# Patient Record
Sex: Male | Born: 1950 | Race: White | Hispanic: No | Marital: Single | Smoking: Never smoker
Health system: Southern US, Community
[De-identification: ages and names within clinical notes are randomized; demographics above are authoritative.]

## PROBLEM LIST (undated history)

## (undated) DIAGNOSIS — E785 Hyperlipidemia, unspecified: Secondary | ICD-10-CM

## (undated) DIAGNOSIS — S31109A Unspecified open wound of abdominal wall, unspecified quadrant without penetration into peritoneal cavity, initial encounter: Secondary | ICD-10-CM

## (undated) DIAGNOSIS — L089 Local infection of the skin and subcutaneous tissue, unspecified: Secondary | ICD-10-CM

## (undated) DIAGNOSIS — I4891 Unspecified atrial fibrillation: Principal | ICD-10-CM

## (undated) DIAGNOSIS — M1990 Unspecified osteoarthritis, unspecified site: Secondary | ICD-10-CM

## (undated) DIAGNOSIS — I10 Essential (primary) hypertension: Secondary | ICD-10-CM

## (undated) HISTORY — PX: WRIST SURGERY: SHX841

## (undated) HISTORY — DX: Morbid (severe) obesity due to excess calories: E66.01

## (undated) HISTORY — DX: Essential (primary) hypertension: I10

## (undated) HISTORY — PX: VENTRAL HERNIA REPAIR: SHX424

## (undated) HISTORY — DX: Hyperlipidemia, unspecified: E78.5

---

## 2007-11-22 HISTORY — PX: ABDOMINAL DEBRIDEMENT: SHX1109

## 2008-01-14 ENCOUNTER — Inpatient Hospital Stay (HOSPITAL_COMMUNITY): Admission: EM | Admit: 2008-01-14 | Discharge: 2008-01-17 | Payer: Self-pay | Admitting: Emergency Medicine

## 2008-08-08 ENCOUNTER — Ambulatory Visit (HOSPITAL_COMMUNITY): Admission: RE | Admit: 2008-08-08 | Discharge: 2008-08-08 | Payer: Self-pay | Admitting: Surgery

## 2011-04-05 NOTE — H&P (Signed)
NAME:  Jeremy Rush, Jeremy Rush               ACCOUNT NO.:  [id number]   MEDICAL RECORD NO.:  [id number]          PATIENT TYPE:  INP   LOCATION:  5506                         FACILITY:  [facility]   PHYSICIAN:  Madaline Savage, MD        DATE OF BIRTH:  07/28/1955   DATE OF ADMISSION:  01/13/2008  DATE OF DISCHARGE:                              HISTORY & PHYSICAL   PRIMARY CARE PHYSICIAN:  [facility].   CHIEF COMPLAINT:  Redness over his abdomen.   HISTORY OF PRESENT ILLNESS:  Jeremy Rush is a 60 year old, obese,  gentleman with a history of hypertension and multiple surgeries for  ventral hernia, concerned with the redness over his surgical scar for  approximately one week.  He first noticed redness around his abdomen  over his umbilicus.  This was slowly getting darker.  Yesterday he  noticed that there was a knot right in the middle of the redness and  today the knot burst and reddish fluid came out of it.  He denied any  fever or chills.  He denied having any other complaints. He denied  having abdominal pain, nausea, vomiting, diarrhea, constipation.   PAST MEDICAL HISTORY:  1. Hypertension  2. Arthritis of knees.   PAST SURGICAL HISTORY:  1. He had four surgeries for a ventral hernia in the past. His last      surgery was approximately 10 years ago.  2. Right wrist surgery.   ALLERGIES:  He is allergic to CODEINE and MORPHINE.   CURRENT MEDICATIONS:  He states he is on a fluid pill.  He does not  remember the name of the medication or the dose.   SOCIAL HISTORY:  He is on disability.  He denies any history of smoking,  alcohol, or drug abuse.   FAMILY HISTORY:  His mother is 26. She is healthy.  His father died in  his 83's of some kind of infection.  He is not sure about it.   REVIEW OF SYSTEMS:  GENERAL:  He denies any recent weight loss, weight  gain. No fever or chills.  HEENT: No headaches, no blurred vision, no  sore throat. CARDIOVASCULAR SYSTEM:  Palpitations.  RESPIRATORY  SYSTEM:  No shortness of breath, cough.  ABDOMEN:  No abdominal pain, nausea,  vomiting, diarrhea, or constipation.  GU SYSTEM:  Denies any dysuria or  polyuria.  EXTREMITIES:  He denies any tingling or numbness in toes or  fingers.   PHYSICAL EXAMINATION:  GENERAL:  Alert and oriented x3.  VITAL SIGNS:  Temperature 97, pulse rate 77, blood pressure 151/89,  respiratory rate 20, oxygen saturation 94% on room air.  HEENT:  Head atraumatic, normocephalic.  Pupils bilaterally equal and  reactive to light.  Mucous membranes are moist.  NECK:  Supple.  No JVD.  No carotid bruits.  CARDIOVASCULAR:  S1 and S2.  Regular rate and rhythm.  CHEST:  Clear to auscultation bilaterally.  ABDOMEN:  There is an area of redness approximately 12 x 8 cm over his  umbilicus.  This area of redness is on the surgical scar from his  ventral hernia surgery.  At the center of the cellulitis, there is a  thickening which is draining reddish liquid at this time.  EXTREMITIES:  No edema, cyanosis or clubbing.   LABORATORY DATA:  White count 11.6, hemoglobin 13, platelets 278,000.  Sodium 138, potassium 3.7, chloride 102, bicarb 29, BUN 13, creatinine  1.4.  Glucose 112.   IMPRESSION:  1. Abdominal wall cellulitis.  2. Leukocytosis.  3. Hypertension.  4. Obesity.   PLAN:  This is a 60 year old, obese gentleman who has a history of  multiple ventral hernia surgeries who comes in with cellulitis of the  abdominal wall.  His last surgery was approximately 10 years ago and he  has not had any trouble since then.  At this time he will be admitted to  start him on IV antibiotics.  I will start him Ancef at this time and we  will obtain cultures on him.  We will also consult wound care to see  him.  Will start him empirically on hydrochlorothiazide for his blood  pressure.  This can be changed once his home medications are known.  I  will also put him on DVT prophylaxis.      Madaline Savage, MD  Electronically  Signed     PKN/MEDQ  D:  01/14/2008  T:  01/14/2008  Job:  [id number]

## 2011-04-05 NOTE — Consult Note (Signed)
NAME:  Jeremy Rush, Jeremy Rush               ACCOUNT NO.:  [id number]   MEDICAL RECORD NO.:  [id number]          PATIENT TYPE:  INP   LOCATION:  5506                         FACILITY:  [facility]   PHYSICIAN:  Sandria Bales. Ezzard Standing, M.D.  DATE OF BIRTH:  07/28/1955   DATE OF CONSULTATION:  01/14/2008  DATE OF DISCHARGE:                                 CONSULTATION   Date of Consultation?  When was this dictated?   CONSULTING SURGEON:  Dr. Ezzard Standing   PRIMARY CARE PHYSICIAN:  The patient does not remember the name, but  notes that he goes to [facility].   REASON FOR CONSULTATION:  Abdominal cellulitis, questionable infected  mesh.   HISTORY OF PRESENT ILLNESS:  This is a 60 year old white male with a  history for prior abdominal ventral hernia repairs in which the last one  was done over 10 years ago and hypertension who presents with abdominal  cellulitis.  The patient states that his abdomen began to get red over  the past week around his transverse incision from where his prior  ventral hernia repairs had been done.  He states at this time, this did  not hurt.  However, yesterday it began to hurt, and he developed a large  knot that he said later ruptured where he noticed red fluid as well as  possible pus that drained out of it.  At this time, he came to the  emergency room.  Once here, he was found to have a white blood cell  count of 11.6.  At this time, he was admitted and started on vancomycin.  Repeat labs from this morning shows white blood cell count has decreased  to 8.6 at this time.  Wound ostomy care nurse consult was obtained in  which they recommended the wound be packed with iodoform gauze.  At this  time, we were then consulted with questionable infected mesh with  abscess and abdominal cellulitis.   REVIEW OF SYSTEMS:  See HPI.  Otherwise, all other systems are negative  at this time.   FAMILY HISTORY:  Noncontributory.   PAST MEDICAL HISTORY:  1. Hypertension.  2.  Osteoarthritis of both knees.   PAST SURGICAL HISTORY:  Four hernia repairs with the last on been over  10 years ago.   SOCIAL HISTORY:  The patient denies any use of tobacco or alcohol.  He  states that he is not married and lives with his mom currently.  He is  also on disability.   ALLERGIES:  CODEINE AND MORPHINE.   MEDICATIONS:  The patient notes at home he takes a fluid pill.  However, he does not know the name.  Since being here, he has been put  on Lovenox, hydrochlorothiazide, vancomycin and Dilaudid for pain.   PHYSICAL EXAMINATION:  GENERAL:  This is a pleasant 60 year old white  male who is sitting on the side of his bed in no acute distress.  VITAL SIGNS:  Temperature 97.6, pulse 52, respirations 20, blood  pressure 103/60.  HEENT:  EYES:  Sclerae noninjected.  PERRL and EOMI.  Ears Nose, Mouth  and Throat:  Ears and nose, there are no obvious lesions or masses.  Mouth is pink and moist.  Throat:  There is no erythema and uvula is  midline.  NECK:  Supple.  No thyromegaly.  Trachea is midline.  RESPIRATIONS:  Effort is normal, otherwise, he is clear to auscultation  in the left lung.  However, the right there is audible wheezes heard  throughout all lung fields.  Otherwise, did not hear any rhonchi or  rales.  CARDIOVASCULAR:  Regular rate and rhythm.  Normal S1-S2 with no murmurs,  gallops or rubs are noted.  The patient has +2 bilateral carotid and  pedal pulses.  CHEST:  Symmetrical.  ABDOMEN:  Soft, obese with positive bowel sounds and there is tenderness  noted just below the umbilicus around the patient's wound.  There is no  guarding or rebound noted.  The patient does have an obvious wound just  inferior to his umbilicus.  That will later be described under SKIN.  MUSCULOSKELETAL:  All four extremities are symmetrical with no clubbing,  cyanosis or edema.  SKIN:  There are no obvious rashes or masses.  However, there is a wound  noted on the patient's  abdomen just inferior to his umbilicus.  This  wound is present over his transverse scar from his prior hernia surgery.  This is a 1/2 cm by 1/2 cm by 5 cm wound with some bloody drainage noted  on the patient's wound dressings.  This area is surrounded by erythema  as well as from the area is indurated.  There is no obvious pus-like  drainage noted on the bandages.  From the wound ostomy care nurses note,  she notes that there is tunneling to 2 cm at the 12 o'clock position and  the 6 o'clock position.  She notes moderate bloody drainage.  However  and no odor.  NEUROLOGICAL:  Cranial nerves II-XII are grossly intact.  PSYCHIATRIC:  The person who is alert and oriented x3.  Affect is  appropriate.   LABORATORY DATA:  White blood cell count is 8600 which has come down  from 11,600, hemoglobin is 12.4, hematocrit is 36.6.  Sodium is 137,  potassium is 3.3, BUN is 11, creatinine of 1.14.  There are currently no  diagnostic studies.   IMPRESSION:  1. Abdominal cellulitis/questionable infected mesh.  2. History of four prior abdominal ventral hernia repairs.  3. Leukocytosis which is improved since starting on vancomycin.  4. Hypertension.   PLAN:  At this time, we agree with the starting of vancomycin as well as  beginning to pack the wound with iodoform gauze.  However, at this time,  based on the appearance of the wound, surgical debridement cannot be  excluded.  I will discuss this case with Dr. Ezzard Standing, and after his  evaluation of the patient, he will make any further recommendations as  necessary.      Letha Cape, PA      Sandria Bales. Ezzard Standing, M.D.  Electronically Signed    KEO/MEDQ  D:  01/14/2008  T:  01/14/2008  Job:  [id number]   cc:   Hettie Holstein, D.O.  [facility]

## 2011-04-05 NOTE — Op Note (Signed)
NAME:  Jeremy Rush, Jeremy Rush               ACCOUNT NO.:  [id number]   MEDICAL RECORD NO.:  [id number]          PATIENT TYPE:  INP   LOCATION:  5506                         FACILITY:  [facility]   PHYSICIAN:  Sandria Bales. Ezzard Standing, M.D.  DATE OF BIRTH:  07/28/1955   DATE OF PROCEDURE:  01/15/2008  DATE OF DISCHARGE:                               OPERATIVE REPORT   PREOPERATIVE DIAGNOSIS:  Infected abdominal wall abscess.   POSTOPERATIVE DIAGNOSIS:  Infected abdominal wall abscess, mesh at base  of abscess.   PROCEDURE:  Incision and drainage of abscess.   ANESTHESIA:  10 mL of 2% Xylocaine plain.   SURGEON:  Sandria Bales. Ezzard Standing, M.D.   INDICATIONS FOR PROCEDURE:  The patient is a morbidly obese 60 year old  white male who presents with a draining wound from his anterior  abdominal wall.  He gives a history 10 years ago of having a mesh  repair, he thinks by [employer]. Young, M.D., but there is some lack of  clarity about who did the surgery.  Anyway right now the wound is  adequately drained and I think we need to open it more widely.  I cannot  palpate the bottom of the abscess.   DESCRIPTION OF PROCEDURE:  With the patient on the floor at 5500, his  skin was prepped with Betadine solution and infiltrated with 2%  Xylocaine.   I made an incision expanding a hole which was about 1.5 cm to about 5 cm  where I could actually palpate the bottom of the mesh and try to get any  ellipse underneath opened.  He did have some palpable mesh that I could  feel but no other abnormality palpated.  I then packed it with gauze and  we will start three times a day saline damp-to-dry gauze on this.  The  patient tolerated the procedure well.  He will kept here for at least 1-  3 more days for wound care and then discharge the patient with followup  wound care.      Sandria Bales. Ezzard Standing, M.D.  Electronically Signed     DHN/MEDQ  D:  01/15/2008  T:  01/16/2008  Job:  [id number]   cc:   Hettie Holstein, D.O.

## 2011-04-05 NOTE — Op Note (Signed)
NAME:  TRAYVEN, LUMADUE               ACCOUNT NO.:  [id number]   MEDICAL RECORD NO.:  [id number]          PATIENT TYPE:  AMB   LOCATION:  DAY                          [facility]ACILITY[facility]  [facility]   PHYSICIAN:  Sandria Bales. Ezzard Standing, M.D.  DATE OF BIRTH:  04-Mar-1951   DATE OF PROCEDURE:  08/08/2008  DATE OF DISCHARGE:                               OPERATIVE REPORT   Date of surgery ??   PREOPERATIVE DIAGNOSIS:  Chronic draining abdominal wound.   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   PROCEDURE:  Debridement of abdominal wound and removal of suture/mesh  material.   SURGEON:  Sandria Bales. Ezzard Standing, M.D.   FIRST ASSISTANT:  None.   ANESTHESIA:  MAC anesthesia with 20 mL of local anesthetic.   COMPLICATIONS:  None.   INDICATIONS FOR PROCEDURE:  Mr. Weintraub is a 60 year old morbidly obese  white male who goes through [facility] for his medical care, had an  open ventral hernia repair with mesh by Dr. Francina Ames approximately  1998.  He did well until February 2009 when he presented with an  abdominal wall abscess to the medical service at [facility].   I did incision and drainage of this abscess and the infection went down  to the anterior abdominal wall mesh.  We tried treating this a locally  at home.  I tried to debride a the mesh in the office but not successful  in getting control of this drainage.  Therefore I am bringing him to the  operating room to explore the wound and see if there is anything else I  can do.   The indications, potential complications of procedure were explained to  the patient.  Potential complications were primarily infection which he  already has, and bleeding.  There is a concern of damaging as he may  have small bowel stuck under this, but he has never had any small bowel  contents that I have seen and I am a little concerned about injuring  small bowel trying to get the mesh out.   DESCRIPTION OF PROCEDURE:  The  patient was placed in a supine position.  His abdomen was prepped with [employer] and sterilely draped.  He was  given MAC anesthesia.  I infiltrated the skin with about 20 mL of a  mixture of 0.25% Marcaine and 1% Xylocaine.  I cut down in this wound.  I opened the wound about 7 or 8 cm in length.  Probably at the base it  is about 5 cm in length.   I found at least two suture granulomas which I pulled out and I tried to  debride back as much of the mesh as possible.  I got back to edges of  mesh which was just incorporated in the fascia.  I was a little bit  cautious about trying to debride too deep in the wound and I then packed  it with Betadine gauze and sterilely dressed it.   The patient tolerated the procedure well.  He will be discharged home  today.  We will arrange  home health care tomorrow for the patient to be  seen and to return to see me in about 5-14 days.      Sandria Bales. Ezzard Standing, M.D.  Electronically Signed     DHN/MEDQ  D:  08/08/2008  T:  08/11/2008  Job:  [id number]

## 2011-04-05 NOTE — Discharge Summary (Signed)
NAME:  Jeremy Rush, Jeremy Rush NO.:  [id number]   MEDICAL RECORD NO.:  [id number]          PATIENT TYPE:  INP   LOCATION:  5506                         FACILITY:  [facility]   PHYSICIAN:  Eduard Clos, MDDATE OF BIRTH:  07/28/1955   DATE OF ADMISSION:  01/13/2008  DATE OF DISCHARGE:  01/17/2008                               DISCHARGE SUMMARY   COURSE IN THE HOSPITAL:  A 60 year old male with known history of  hypertension and history of multiple ventral hernia repairs presented  with erythema around the abdominal area.  The patient admitted to  medical floor bed.  Cultures were obtained on admission, which were  negative for any growth at the time of discharge.  The patient had a CAT  scan obtained at admission, which showed subcutaneous soft tissue  density just above the umbilicus with a small amount of air, most  consistent with cellulitis with no discrete abscess collection.  No  intraabdominal abscess.  Surgery was consulted.  As per Dr. Ezzard Standing,  surgeon, the patient underwent debridement with dressings.  The  patient's condition gradually improved during his stay.  The patient had  mild bradycardia, but the patient was asymptomatic.  As per Dr. Ezzard Standing,  the patient can be discharged home with no antibiotics.  He will follow  up with him in 2 weeks, and home health care to do dressing.  At the  time of discharge, the patient was hemodynamically stable.   ASSESSMENT:  1. Abdominal wall cellulitis.  2. Status post debridement.  3. Hypertension.  4. Morbid obesity.   MEDICATION ON DISCHARGE:  Lisinopril HCTZ 20/25 mg p.o. daily.   PLAN:  The patient to follow up with his primary care physician within 3  days and check his basic metabolic panel, to follow up with Dr. Ezzard Standing,  surgeon, in 2 weeks.  The patient is discharged home with home health  care for wound dressing.  The patient is to be on a cardiac healthy  diet.  The patient advised to lose  weight.      Eduard Clos, MD  Electronically Signed     ANK/MEDQ  D:  01/17/2008  T:  01/17/2008  Job:  [id number]

## 2011-08-03 ENCOUNTER — Encounter (INDEPENDENT_AMBULATORY_CARE_PROVIDER_SITE_OTHER): Payer: Self-pay | Admitting: Surgery

## 2011-08-15 LAB — BASIC METABOLIC PANEL
BUN: 11
BUN: 8
Calcium: 8.4
GFR calc Af Amer: >60
GFR calc non Af Amer: 57 — ABNORMAL LOW
GFR calc non Af Amer: >60
Glucose, Bld: 119 — ABNORMAL HIGH
Potassium: 3.3 — ABNORMAL LOW
Potassium: 3.5
Sodium: 137
Sodium: 140

## 2011-08-15 LAB — DIFFERENTIAL
Basophils Absolute: 0
Basophils Absolute: 0.1
Eosinophils Absolute: 0.6
Eosinophils Relative: 5
Lymphocytes Relative: 13
Lymphs Abs: 1.1
Monocytes Absolute: 0.9
Neutro Abs: 6.4
Neutrophils Relative %: 74

## 2011-08-15 LAB — CBC
HCT: 40.3
Hemoglobin: 13.7
MCHC: 33.9
MCV: 84.6
Platelets: 230
Platelets: 278
RDW: 13.4
RDW: 13.6
WBC: 8.6

## 2011-08-15 LAB — I-STAT 8, (EC8 V) (CONVERTED LAB)
BUN: 13
Bicarbonate: 27.8 — ABNORMAL HIGH
Glucose, Bld: 112 — ABNORMAL HIGH
Hemoglobin: 14.6
Sodium: 138
TCO2: 29
pH, Ven: 7.407 — ABNORMAL HIGH

## 2011-08-15 LAB — CULTURE, BLOOD (ROUTINE X 2): Culture: NO GROWTH

## 2011-08-15 LAB — CARDIAC PANEL(CRET KIN+CKTOT+MB+TROPI)
Relative Index: 1.4
Total CK: 148
Troponin I: 0.02

## 2011-08-15 LAB — LIPID PANEL
Cholesterol: 115
HDL: 21 — ABNORMAL LOW
LDL Cholesterol: 86
Triglycerides: 42

## 2011-08-15 LAB — B-NATRIURETIC PEPTIDE (CONVERTED LAB): Pro B Natriuretic peptide (BNP): 66

## 2011-08-15 LAB — TSH: TSH: 2.416

## 2011-08-15 LAB — POCT I-STAT CREATININE: Operator id: 277751

## 2011-08-22 LAB — COMPREHENSIVE METABOLIC PANEL
AST: 15
CO2: 28
Calcium: 8.9
Creatinine, Ser: 1.15
GFR calc Af Amer: >60
GFR calc non Af Amer: >60
Glucose, Bld: 107 — ABNORMAL HIGH

## 2011-08-22 LAB — CBC
MCHC: 33.9
MCV: 87.1
RBC: 5.48
RDW: 14.3

## 2011-08-22 LAB — DIFFERENTIAL
Lymphocytes Relative: 17
Lymphs Abs: 1.5
Neutrophils Relative %: 69

## 2011-09-07 ENCOUNTER — Telehealth (INDEPENDENT_AMBULATORY_CARE_PROVIDER_SITE_OTHER): Payer: Self-pay | Admitting: Surgery

## 2011-09-09 NOTE — Telephone Encounter (Signed)
I rescheduled patient's appt for 11-1.

## 2011-09-16 ENCOUNTER — Ambulatory Visit (INDEPENDENT_AMBULATORY_CARE_PROVIDER_SITE_OTHER): Payer: Self-pay | Admitting: Surgery

## 2011-09-21 ENCOUNTER — Encounter (INDEPENDENT_AMBULATORY_CARE_PROVIDER_SITE_OTHER): Payer: Self-pay

## 2011-09-22 ENCOUNTER — Encounter (INDEPENDENT_AMBULATORY_CARE_PROVIDER_SITE_OTHER): Payer: Self-pay | Admitting: Surgery

## 2011-09-22 ENCOUNTER — Ambulatory Visit (INDEPENDENT_AMBULATORY_CARE_PROVIDER_SITE_OTHER): Payer: Medicaid Other | Admitting: Surgery

## 2011-09-22 DIAGNOSIS — L089 Local infection of the skin and subcutaneous tissue, unspecified: Secondary | ICD-10-CM | POA: Insufficient documentation

## 2011-09-22 DIAGNOSIS — T148XXA Other injury of unspecified body region, initial encounter: Secondary | ICD-10-CM

## 2011-09-22 NOTE — Progress Notes (Signed)
ASSESSMENT AND PLAN: 1.  Chronic abdominal wound  Original onlay mesh by Dr. Luan Moore 1999.  I debrided in Sept 2009.  Now with draining sinus.  I debrided the wound today.  I removed a long suture (prob Novafil) and gave it to him.  Return to the office in 3 months.  2. Morbid obesity.  In or around 400 pounds.  HISTORY OF PRESENT ILLNESS: Chief Complaint  Patient presents with  . Wound Check    Recheck abdominal wound   Jeremy LIBERTO is a 60 y.o. (DOB: 12/28/1950)  white male who is a patient of No primary provider on file. and comes to me today for draining abdominal wall sinus.  The patient had an onlay mesh placed by Dr. Francina Ames in 1999. He [facility] 2009 with an abdominal wall abscess. I did an incision and drainage of abdominal wall abscess. 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  His morbid obesity limits the care of this wound. I had debrided this wound in the office every 3 or 4 months. We have discussed about going back to the operating room for further debridement, but at this time he was to continue the office visit debridements.  We talked a little about his mother who has been sick.  PHYSICAL EXAM: BP 150/90  Pulse 76  Temp(Src) 98 F (36.7 C) (Temporal)  Resp 20  Ht 5\' 8"  (1.727 m)  Wt 408 lb 12.8 oz (185.43 kg)  BMI 62.16 kg/m2  Abdomen:  Obese.  Draining sinus wound.  I removed some mesh material.  I removed one long suture and gave it to him.  Draining sinus is 1 cm.  DATA REVIEWED: None

## 2011-12-29 ENCOUNTER — Ambulatory Visit (INDEPENDENT_AMBULATORY_CARE_PROVIDER_SITE_OTHER): Payer: Medicaid Other | Admitting: Surgery

## 2012-02-02 ENCOUNTER — Ambulatory Visit (INDEPENDENT_AMBULATORY_CARE_PROVIDER_SITE_OTHER): Payer: Medicaid Other | Admitting: Surgery

## 2012-02-02 ENCOUNTER — Encounter (INDEPENDENT_AMBULATORY_CARE_PROVIDER_SITE_OTHER): Payer: Self-pay | Admitting: Surgery

## 2012-02-02 VITALS — BP 168/96 | HR 74 | Temp 98.2°F | Resp 20 | Ht 68.0 in | Wt >= 6400 oz

## 2012-02-02 DIAGNOSIS — L089 Local infection of the skin and subcutaneous tissue, unspecified: Secondary | ICD-10-CM

## 2012-02-02 DIAGNOSIS — T148XXA Other injury of unspecified body region, initial encounter: Secondary | ICD-10-CM

## 2012-02-02 NOTE — Progress Notes (Signed)
ASSESSMENT AND PLAN: 1.  Chronic abdominal wound  Original onlay mesh by Dr. Luan Moore 1999.  I debrided in Sept 2009.  Now with draining chronic sinus.  In looks a little better today.  Return to the office in 3 months.  2. Morbid obesity.  In or around 400 pounds.  Only two pounds down from last time.  3.  We talked about his mother who is having her issues.  HISTORY OF PRESENT ILLNESS: Chief Complaint  Patient presents with  . Wound Check    Abdominal   Jeremy Rush is a 61 y.o. (DOB: [birth year])  white male who is a patient of [facility] Not In System and comes to me today for draining abdominal wall sinus.  The patient had an onlay mesh placed by Dr. Francina Ames in 1999. He presented to [facility] in September 2009 with an abdominal wall abscess. I did an incision and drainage of abdominal wall abscess. 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  His morbid obesity limits the care of this wound. I had debrided this wound in the office every 3 or 4 months. We have discussed about going back to the operating room for further debridement, but at this time he was to continue the office visit debridements.  We talked about his mother who has been sick.  PHYSICAL EXAM: BP 168/96  Pulse 74  Temp(Src) 98.2 F (36.8 C) (Temporal)  Resp 20  Ht 5\' 8"  (1.727 m)  Wt 406 lb 12.8 oz (184.523 kg)  BMI 61.85 kg/m2  Abdomen:  Obese.  Draining sinus wound.  I removed some mesh material.  It looks a little better today than it has in the past.  DATA REVIEWED: None

## 2012-08-02 ENCOUNTER — Telehealth (INDEPENDENT_AMBULATORY_CARE_PROVIDER_SITE_OTHER): Payer: Self-pay | Admitting: General Surgery

## 2012-08-02 ENCOUNTER — Ambulatory Visit (INDEPENDENT_AMBULATORY_CARE_PROVIDER_SITE_OTHER): Payer: Medicaid Other | Admitting: Surgery

## 2012-08-02 NOTE — Telephone Encounter (Signed)
Called patient home in attempt to advise of new appointment. Patient mother picked up phone and said she was unable to write information down because she was in bed. Asked her to have the patient call our office back to obtain the new appointment day and time and she agreed.

## 2012-09-05 ENCOUNTER — Encounter (INDEPENDENT_AMBULATORY_CARE_PROVIDER_SITE_OTHER): Payer: Self-pay | Admitting: Surgery

## 2012-09-05 ENCOUNTER — Ambulatory Visit (INDEPENDENT_AMBULATORY_CARE_PROVIDER_SITE_OTHER): Payer: Medicaid Other | Admitting: Surgery

## 2012-09-05 VITALS — BP 154/98 | HR 48 | Temp 97.3°F | Resp 24 | Ht 68.0 in | Wt >= 6400 oz

## 2012-09-05 DIAGNOSIS — L089 Local infection of the skin and subcutaneous tissue, unspecified: Secondary | ICD-10-CM

## 2012-09-05 NOTE — Progress Notes (Signed)
ASSESSMENT AND PLAN: 1.  Chronic abdominal wound  Original onlay mesh by Dr. Luan Moore 1999.  I debrided in Sept 2009.  Has draining chronic sinus.  Looks better today.  I had not seen him in 6 months.  Return to the office in 4 months.  2. Morbid obesity.  In or around 400 pounds.  He has gained 20 pounds since I last saw him.  We talked about him getting back to trying to loose weight.  3.  We talked about his mother who is having her issues. He is having trouble taking care of her at home. 4.  He says that his nerves are shot.  This is from dealing with his mother.  HISTORY OF PRESENT ILLNESS: Chief Complaint  Patient presents with  . Wound Check    reck abd wound   Jeremy Rush is a 61 y.o. (DOB: 03-Jul-1951)  white male who is a patient of [facility] In System and comes to me today for draining abdominal wall sinus.  He is doing okay, though he is stressed by taking care of his mother.  The wound drains some, but he is tolerating it well.  History of wound: The patient had an onlay mesh placed by Dr. Francina Ames in 1999. He presented to the [facility] in September 2009 with an abdominal wall abscess. I did an incision and drainage of abdominal wall abscess. 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  His morbid obesity limits the care of this wound. I had debrided this wound in the office every 3 or 4 months. We have discussed about going back to the operating room for further debridement, but at this time he was to continue the office visit debridements.  PHYSICAL EXAM: BP 154/98  Pulse 48  Temp 97.3 F (36.3 C) (Temporal)  Resp 24  Ht 5\' 8"  (1.727 m)  Wt 424 lb 3.2 oz (192.416 kg)  BMI 64.50 kg/m2  Abdomen:  Obese.  Draining sinus wound.  I probed it with a hemostat.  I did not remove anything today.  I did open up the draining hole for 2 mm to 9 mm.  DATA REVIEWED: None

## 2012-10-06 ENCOUNTER — Emergency Department (HOSPITAL_COMMUNITY)
Admission: EM | Admit: 2012-10-06 | Discharge: 2012-10-06 | Disposition: A | Discharge status: Home / Self Care | Payer: Medicaid Other | Attending: Emergency Medicine | Admitting: Emergency Medicine

## 2012-10-06 ENCOUNTER — Encounter (HOSPITAL_COMMUNITY): Payer: Self-pay | Admitting: Emergency Medicine

## 2012-10-06 DIAGNOSIS — R220 Localized swelling, mass and lump, head: Secondary | ICD-10-CM | POA: Insufficient documentation

## 2012-10-06 DIAGNOSIS — Z8739 Personal history of other diseases of the musculoskeletal system and connective tissue: Secondary | ICD-10-CM | POA: Insufficient documentation

## 2012-10-06 DIAGNOSIS — K089 Disorder of teeth and supporting structures, unspecified: Secondary | ICD-10-CM | POA: Insufficient documentation

## 2012-10-06 DIAGNOSIS — K029 Dental caries, unspecified: Secondary | ICD-10-CM | POA: Insufficient documentation

## 2012-10-06 DIAGNOSIS — I10 Essential (primary) hypertension: Secondary | ICD-10-CM | POA: Insufficient documentation

## 2012-10-06 DIAGNOSIS — K0889 Other specified disorders of teeth and supporting structures: Secondary | ICD-10-CM

## 2012-10-06 MED ORDER — CLINDAMYCIN HCL 300 MG PO CAPS: 300.0000 mg | ORAL_CAPSULE | Freq: Four times a day (QID) | ORAL | Status: DC

## 2012-10-06 MED ORDER — CLINDAMYCIN HCL 150 MG PO CAPS
300.0000 mg | ORAL_CAPSULE | Freq: Once | ORAL | Status: AC
  Administered 2012-10-06: 300 mg via ORAL
  Filled 2012-10-06: qty 2

## 2012-10-06 NOTE — ED Provider Notes (Signed)
History     CSN: [id number]  Arrival date & time 10/06/12  8657   First MD Initiated Contact with Patient 10/06/12 [phone]      Chief Complaint  Patient presents with  . Dental Pain    (Consider location/radiation/quality/duration/timing/severity/associated sxs/prior treatment) Patient is a 61 y.o. male presenting with tooth pain. The history is provided by the patient.  Dental PainThe primary symptoms include mouth pain. Primary symptoms do not include dental injury, oral bleeding, headaches, fever, shortness of breath, sore throat, angioedema or cough. The symptoms began 3 to 5 days ago. The symptoms are worsening. The symptoms are chronic. The symptoms occur constantly.  Affected locations include: teeth and gum(s).  Additional symptoms include: dental sensitivity to temperature, gum swelling, gum tenderness and facial swelling. Additional symptoms do not include: purulent gums, trismus, jaw pain, trouble swallowing, pain with swallowing, ear pain and swollen glands. Medical issues include: periodontal disease. Medical issues do not include: smoking.    Past Medical History  Diagnosis Date  . Morbid obesity   . Hypertension   . Arthritis   . Wheezing     Past Surgical History  Procedure Date  . Ventral hernia repair   . Debridement abdominal wound 2009    No family history on file.  History  Substance Use Topics  . Smoking status: Never Smoker   . Smokeless tobacco: Never Used  . Alcohol Use: No      Review of Systems  Constitutional: Negative for fever and appetite change.  HENT: Positive for facial swelling and dental problem. Negative for ear pain, congestion, sore throat, trouble swallowing, neck pain and neck stiffness.   Eyes: Negative for pain and visual disturbance.  Respiratory: Negative for cough and shortness of breath.   Neurological: Negative for dizziness, facial asymmetry and headaches.  Hematological: Negative for adenopathy.  All other systems  reviewed and are negative.    Allergies  Codeine and Morphine and related  Home Medications   Current Outpatient Rx  Name  Route  Sig  Dispense  Refill  . LISINOPRIL PO   Oral   Take by mouth.         Marland Kitchen LORATADINE PO   Oral   Take by mouth.           BP 169/105  Pulse 93  Temp 97.9 F (36.6 C) (Oral)  Resp 22  Ht 5\' 4"  (1.626 m)  Wt 424 lb (192.325 kg)  BMI 72.78 kg/m2  SpO2 94%  Physical Exam  Nursing note and vitals reviewed. Constitutional: He is oriented to person, place, and time. He appears well-developed and well-nourished. No distress.  HENT:  Head: Normocephalic and atraumatic. No trismus in the jaw.  Right Ear: Tympanic membrane and ear canal normal.  Left Ear: Tympanic membrane and ear canal normal.  Mouth/Throat: Uvula is midline, oropharynx is clear and moist and mucous membranes are normal. Dental caries present. No dental abscesses or uvula swelling.         ttp of the left upper incisors.  Mild facial edema. Likely early abscess.  No trismus  Neck: Normal range of motion. Neck supple.  Cardiovascular: Normal rate, regular rhythm and normal heart sounds.   No murmur heard. Pulmonary/Chest: Effort normal and breath sounds normal.  Musculoskeletal: Normal range of motion.  Lymphadenopathy:    He has no cervical adenopathy.  Neurological: He is alert and oriented to person, place, and time. He exhibits normal muscle tone. Coordination normal.  Skin: Skin is warm  and dry.    ED Course  Procedures (including critical care time)  Labs Reviewed - No data to display No results found.      MDM    ttp of the left upper incisors with moderate to severe dental decay.  Mild facial edema.  Airway patent.  No obvious dental abscess seen to indicate need for I&D at this time. Pt has tramadol and ibuprofen at home.    Agrees to close f/u with his dentist this week  Prescribed: clindamycin      Glenette Bookwalter L. [place], [place] 10/06/12 [phone]

## 2012-10-06 NOTE — ED Notes (Signed)
Pt c/o left sided dental pain since yesterday. 

## 2012-10-08 NOTE — ED Provider Notes (Signed)
Medical screening examination/treatment/procedure(s) were performed by non-physician practitioner and as supervising physician I was immediately available for consultation/collaboration.   Shelda Jakes, MD 10/08/12 [phone]

## 2012-11-21 DIAGNOSIS — I4891 Unspecified atrial fibrillation: Principal | ICD-10-CM

## 2012-11-21 HISTORY — DX: Unspecified atrial fibrillation: I48.91

## 2012-12-27 ENCOUNTER — Ambulatory Visit (INDEPENDENT_AMBULATORY_CARE_PROVIDER_SITE_OTHER): Payer: Medicaid Other | Admitting: Surgery

## 2012-12-27 ENCOUNTER — Encounter (INDEPENDENT_AMBULATORY_CARE_PROVIDER_SITE_OTHER): Payer: Self-pay | Admitting: Surgery

## 2012-12-27 VITALS — BP 130/62 | HR 59 | Temp 97.6°F | Resp 22 | Ht 68.0 in | Wt >= 6400 oz

## 2012-12-27 DIAGNOSIS — L089 Local infection of the skin and subcutaneous tissue, unspecified: Secondary | ICD-10-CM

## 2012-12-27 DIAGNOSIS — L02219 Cutaneous abscess of trunk, unspecified: Secondary | ICD-10-CM

## 2012-12-27 NOTE — Progress Notes (Signed)
ASSESSMENT AND PLAN: 1.  Chronic abdominal wound/draining sinus  Original onlay mesh by Dr. Luan Moore 1999.  I debrided in Sept 2009.  Has draining chronic sinus that connects to the mesh.  The wound is stable.   Return to the office in 4 months.  2. Morbid obesity.  In or around 400 pounds.  Jeremy Rush talks about losing weight, but has not done much about this.  3.  We talked about his mother who is having her issues. She is in a nursing home at the present time.  HISTORY OF PRESENT ILLNESS: Chief Complaint  Patient presents with  . Follow-up    LTF/reck abd wound   Jeremy Rush is a 62 y.o. (DOB: [birth year])  white Rush who is a patient of Provider Not In System and comes to me today for draining abdominal wall sinus.  Jeremy Rush is doing okay.  Some days Jeremy Rush says it drains and some days it does not.  His mother is now in a nursing facility beside [facility].  How long she will be there is undecided.  History of wound: The patient had an onlay mesh placed by Dr. Francina Ames in 1999. Jeremy Rush presented to the [facility] the [facility] in September 2009 with an abdominal wall abscess. I did an incision and drainage of abdominal wall abscess. 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  His morbid obesity limits the care of this wound. I had debrided this wound in the office every 3 or 4 months. We have discussed about going back to the operating room for further debridement, but at this time Jeremy Rush was to continue the office visit debridements.  PHYSICAL EXAM: BP 130/62  Pulse 59  Temp 97.6 F (36.4 C) (Temporal)  Resp 22  Ht 5\' 8"  (1.727 m)  Wt 426 lb 9.6 oz (193.504 kg)  BMI 64.86 kg/m2  Abdomen:  Obese.  Draining sinus wound.  The wound is trying to seal over.  But I opened it up.  I did removed some more small pieces of mesh.  We again talked about going to the OR to do a larger debridement, but Jeremy Rush is not interested at this time.  DATA REVIEWED: None

## 2012-12-28 ENCOUNTER — Ambulatory Visit (INDEPENDENT_AMBULATORY_CARE_PROVIDER_SITE_OTHER): Payer: Medicaid Other | Admitting: Surgery

## 2012-12-31 ENCOUNTER — Telehealth (INDEPENDENT_AMBULATORY_CARE_PROVIDER_SITE_OTHER): Payer: Self-pay

## 2012-12-31 NOTE — Telephone Encounter (Signed)
Patient states he has stomach soreness from  Dr. Allene Pyo assessment.He would like something for pain. I advised him to take two  Ibuprofen 200 mg q 6 hrs for pain. And if not any better to call. Patient verbalized understanding.

## 2013-01-16 ENCOUNTER — Inpatient Hospital Stay (HOSPITAL_COMMUNITY)
Admission: EM | Admit: 2013-01-16 | Discharge: 2013-01-18 | DRG: 308 | Disposition: A | Discharge status: Home / Self Care | Payer: Medicaid Other | Attending: Internal Medicine | Admitting: Internal Medicine

## 2013-01-16 ENCOUNTER — Encounter (HOSPITAL_COMMUNITY): Payer: Self-pay | Admitting: Emergency Medicine

## 2013-01-16 ENCOUNTER — Emergency Department (HOSPITAL_COMMUNITY): Payer: Medicaid Other

## 2013-01-16 DIAGNOSIS — R112 Nausea with vomiting, unspecified: Secondary | ICD-10-CM

## 2013-01-16 DIAGNOSIS — R1110 Vomiting, unspecified: Secondary | ICD-10-CM | POA: Diagnosis present

## 2013-01-16 DIAGNOSIS — T8140XA Infection following a procedure, unspecified, initial encounter: Secondary | ICD-10-CM | POA: Diagnosis present

## 2013-01-16 DIAGNOSIS — Z6841 Body Mass Index (BMI) 40.0 and over, adult: Secondary | ICD-10-CM

## 2013-01-16 DIAGNOSIS — R55 Syncope and collapse: Secondary | ICD-10-CM | POA: Diagnosis present

## 2013-01-16 DIAGNOSIS — G4733 Obstructive sleep apnea (adult) (pediatric): Secondary | ICD-10-CM | POA: Diagnosis present

## 2013-01-16 DIAGNOSIS — I10 Essential (primary) hypertension: Secondary | ICD-10-CM | POA: Diagnosis present

## 2013-01-16 DIAGNOSIS — E872 Acidosis, unspecified: Secondary | ICD-10-CM | POA: Diagnosis present

## 2013-01-16 DIAGNOSIS — Z7901 Long term (current) use of anticoagulants: Secondary | ICD-10-CM

## 2013-01-16 DIAGNOSIS — Y838 Other surgical procedures as the cause of abnormal reaction of the patient, or of later complication, without mention of misadventure at the time of the procedure: Secondary | ICD-10-CM | POA: Diagnosis present

## 2013-01-16 DIAGNOSIS — M129 Arthropathy, unspecified: Secondary | ICD-10-CM | POA: Diagnosis present

## 2013-01-16 DIAGNOSIS — Z79899 Other long term (current) drug therapy: Secondary | ICD-10-CM

## 2013-01-16 DIAGNOSIS — I4891 Unspecified atrial fibrillation: Principal | ICD-10-CM | POA: Diagnosis present

## 2013-01-16 DIAGNOSIS — L089 Local infection of the skin and subcutaneous tissue, unspecified: Secondary | ICD-10-CM

## 2013-01-16 DIAGNOSIS — E662 Morbid (severe) obesity with alveolar hypoventilation: Secondary | ICD-10-CM | POA: Diagnosis present

## 2013-01-16 DIAGNOSIS — G9340 Encephalopathy, unspecified: Secondary | ICD-10-CM | POA: Diagnosis present

## 2013-01-16 HISTORY — DX: Unspecified atrial fibrillation: I48.91

## 2013-01-16 HISTORY — DX: Unspecified osteoarthritis, unspecified site: M19.90

## 2013-01-16 LAB — CBC WITH DIFFERENTIAL/PLATELET
Basophils Relative: 0 % (ref 0–1)
HCT: 43.8 % (ref 39.0–52.0)
Hemoglobin: 14.5 g/dL (ref 13.0–17.0)
Lymphocytes Relative: 12 % (ref 12–46)
MCHC: 33.1 g/dL (ref 30.0–36.0)
Monocytes Absolute: 0.5 10*3/uL (ref 0.1–1.0)
Monocytes Relative: 8 % (ref 3–12)
Neutro Abs: 5 10*3/uL (ref 1.7–7.7)

## 2013-01-16 LAB — PROTIME-INR: Prothrombin Time: 14.3 seconds (ref 11.6–15.2)

## 2013-01-16 LAB — RAPID URINE DRUG SCREEN, HOSP PERFORMED
Amphetamines: NOT DETECTED
Opiates: NOT DETECTED

## 2013-01-16 LAB — COMPREHENSIVE METABOLIC PANEL
BUN: 16 mg/dL (ref 6–23)
CO2: 31 mEq/L (ref 19–32)
Chloride: 100 mEq/L (ref 96–112)
Creatinine, Ser: 1.12 mg/dL (ref 0.50–1.35)
GFR calc Af Amer: 81 mL/min — ABNORMAL LOW (ref >90)
GFR calc non Af Amer: 70 mL/min — ABNORMAL LOW (ref >90)
Glucose, Bld: 157 mg/dL — ABNORMAL HIGH (ref 70–99)
Total Bilirubin: 0.9 mg/dL (ref 0.3–1.2)

## 2013-01-16 LAB — URINALYSIS, ROUTINE W REFLEX MICROSCOPIC
Hgb urine dipstick: NEGATIVE
Leukocytes, UA: NEGATIVE
Nitrite: NEGATIVE
Urobilinogen, UA: 0.2 mg/dL (ref 0.0–1.0)
pH: 6 (ref 5.0–8.0)

## 2013-01-16 LAB — BLOOD GAS, ARTERIAL
O2 Content: 2 L/min
O2 Saturation: 97.5 %
Patient temperature: 37
pH, Arterial: 7.326 — ABNORMAL LOW (ref 7.350–7.450)
pO2, Arterial: 96.9 mmHg (ref 80.0–100.0)

## 2013-01-16 LAB — TROPONIN I
Troponin I: <0.3 ng/mL (ref <0.30)
Troponin I: <0.3 ng/mL (ref <0.30)

## 2013-01-16 LAB — APTT: aPTT: 28 seconds (ref 24–37)

## 2013-01-16 LAB — LIPASE, BLOOD: Lipase: 16 U/L (ref 11–59)

## 2013-01-16 MED ORDER — HYDROCHLOROTHIAZIDE 25 MG PO TABS
25.0000 mg | ORAL_TABLET | Freq: Every day | ORAL | Status: DC
  Administered 2013-01-16 – 2013-01-17 (×2): 25 mg via ORAL
  Filled 2013-01-16 (×2): qty 1

## 2013-01-16 MED ORDER — SODIUM CHLORIDE 0.9 % IV SOLN
INTRAVENOUS | Status: DC
  Administered 2013-01-16 – 2013-01-17 (×3): via INTRAVENOUS

## 2013-01-16 MED ORDER — HEPARIN SODIUM (PORCINE) 5000 UNIT/ML IJ SOLN
5000.0000 [IU] | Freq: Three times a day (TID) | INTRAMUSCULAR | Status: DC
  Administered 2013-01-16 – 2013-01-17 (×3): 5000 [IU] via SUBCUTANEOUS
  Filled 2013-01-16 (×3): qty 1

## 2013-01-16 MED ORDER — ONDANSETRON HCL 4 MG/2ML IJ SOLN
4.0000 mg | Freq: Once | INTRAMUSCULAR | Status: AC
  Administered 2013-01-16: 4 mg via INTRAVENOUS
  Filled 2013-01-16: qty 2

## 2013-01-16 MED ORDER — ONDANSETRON HCL 4 MG/2ML IJ SOLN: 4.0000 mg | Freq: Four times a day (QID) | INTRAMUSCULAR | Status: DC | PRN

## 2013-01-16 MED ORDER — LISINOPRIL-HYDROCHLOROTHIAZIDE 20-25 MG PO TABS
1.0000 | ORAL_TABLET | Freq: Every day | ORAL | Status: DC
Start: 2013-01-16 — End: 2013-01-16

## 2013-01-16 MED ORDER — LISINOPRIL 10 MG PO TABS
20.0000 mg | ORAL_TABLET | Freq: Every day | ORAL | Status: DC
  Administered 2013-01-16 – 2013-01-18 (×3): 20 mg via ORAL
  Filled 2013-01-16 (×3): qty 2

## 2013-01-16 MED ORDER — SODIUM CHLORIDE 0.9 % IJ SOLN
3.0000 mL | Freq: Two times a day (BID) | INTRAMUSCULAR | Status: DC
  Administered 2013-01-16 – 2013-01-17 (×2): 3 mL via INTRAVENOUS

## 2013-01-16 MED ORDER — DILTIAZEM HCL 25 MG/5ML IV SOLN
10.0000 mg | Freq: Once | INTRAVENOUS | Status: AC
  Administered 2013-01-16: 10 mg via INTRAVENOUS

## 2013-01-16 MED ORDER — SODIUM CHLORIDE 0.9 % IV SOLN
Freq: Once | INTRAVENOUS | Status: AC
  Administered 2013-01-16: 07:00:00 via INTRAVENOUS

## 2013-01-16 MED ORDER — IOHEXOL 300 MG/ML  SOLN: 50.0000 mL | Freq: Once | INTRAMUSCULAR | Status: DC | PRN

## 2013-01-16 MED ORDER — CLOPIDOGREL BISULFATE 75 MG PO TABS: 75.0000 mg | ORAL_TABLET | Freq: Once | ORAL | Status: DC

## 2013-01-16 MED ORDER — DILTIAZEM HCL 100 MG IV SOLR
5.0000 mg/h | INTRAVENOUS | Status: DC
  Administered 2013-01-16 (×3): 15 mg/h via INTRAVENOUS
  Filled 2013-01-16: qty 100

## 2013-01-16 MED ORDER — SODIUM CHLORIDE 0.9 % IV BOLUS (SEPSIS)
1000.0000 mL | Freq: Once | INTRAVENOUS | Status: AC
  Administered 2013-01-16: 1000 mL via INTRAVENOUS

## 2013-01-16 MED ORDER — DILTIAZEM HCL 100 MG IV SOLR
5.0000 mg/h | Freq: Once | INTRAVENOUS | Status: AC
  Administered 2013-01-16: 5 mg/h via INTRAVENOUS
  Filled 2013-01-16: qty 100

## 2013-01-16 MED ORDER — ONDANSETRON HCL 4 MG PO TABS: 4.0000 mg | ORAL_TABLET | Freq: Four times a day (QID) | ORAL | Status: DC | PRN

## 2013-01-16 MED ORDER — PROMETHAZINE HCL 25 MG/ML IJ SOLN
25.0000 mg | Freq: Once | INTRAMUSCULAR | Status: AC
  Administered 2013-01-16: 25 mg via INTRAVENOUS
  Filled 2013-01-16: qty 1

## 2013-01-16 MED ORDER — LORATADINE 10 MG PO TABS
10.0000 mg | ORAL_TABLET | Freq: Every day | ORAL | Status: DC
  Administered 2013-01-16 – 2013-01-18 (×3): 10 mg via ORAL
  Filled 2013-01-16 (×3): qty 1

## 2013-01-16 MED ORDER — ACETAMINOPHEN 325 MG PO TABS
650.0000 mg | ORAL_TABLET | Freq: Four times a day (QID) | ORAL | Status: DC | PRN
  Administered 2013-01-16 – 2013-01-17 (×2): 650 mg via ORAL
  Filled 2013-01-16 (×2): qty 2

## 2013-01-16 MED ORDER — FUROSEMIDE 10 MG/ML IJ SOLN
40.0000 mg | Freq: Two times a day (BID) | INTRAMUSCULAR | Status: DC
  Administered 2013-01-16 – 2013-01-17 (×2): 40 mg via INTRAVENOUS
  Filled 2013-01-16 (×2): qty 4

## 2013-01-16 NOTE — Progress Notes (Signed)
UR Chart Review Completed  

## 2013-01-16 NOTE — ED Provider Notes (Signed)
History     CSN: [id number]  Arrival date & time 01/16/13  8295   First MD Initiated Contact with Patient 01/16/13 [phone]      Chief Complaint  Patient presents with  . Nausea  . Emesis  . Weakness    (Consider location/radiation/quality/duration/timing/severity/associated sxs/prior treatment) HPI Comments: 62 year old male with a history of morbid obesity hypertension and a ventral hernia repair with a wound that is healing by secondary intent presents with acute onset of nausea vomiting and generalized weakness which occurred this morning when he awoke. He has vomited approximately 4 times, is persistently nauseated, diaphoretic and had near syncope in his bathroom prior to arrival. He denies any bowel movements, he has not had diarrhea or fevers and denies chest pain coughing or shortness of breath. He does have abdominal pain which is chronic, his abdomen does feel distended this morning. Nothing seems to make this better or worse, it is severe, it is not like symptoms experienced previously. He does not have a local family physician, he did not need any questionable food yesterday and has no sick contacts, recent travel or antibiotic use.  Patient is a 62 y.o. male presenting with vomiting and weakness. The history is provided by the patient.  Emesis Weakness    Past Medical History  Diagnosis Date  . Morbid obesity   . Hypertension   . Arthritis   . Wheezing     Past Surgical History  Procedure Laterality Date  . Ventral hernia repair    . Debridement abdominal wound  2009    No family history on file.  History  Substance Use Topics  . Smoking status: Never Smoker   . Smokeless tobacco: Never Used  . Alcohol Use: No      Review of Systems  Gastrointestinal: Positive for vomiting.  Neurological: Positive for weakness.  All other systems reviewed and are negative.    Allergies  Codeine and Morphine and related  Home Medications   Current Outpatient Rx   Name  Route  Sig  Dispense  Refill  . ibuprofen (ADVIL,MOTRIN) 800 MG tablet   Oral   Take 800 mg by mouth every 8 (eight) hours as needed for pain (headache).         . lisinopril-hydrochlorothiazide (PRINZIDE,ZESTORETIC) 20-25 MG per tablet   Oral   Take 1 tablet by mouth daily.         Marland Kitchen loratadine (CLARITIN) 10 MG tablet   Oral   Take 10 mg by mouth daily.         Marland Kitchen OVER THE COUNTER MEDICATION   Oral   Take 1 tablet by mouth daily as needed (congestion.). States that this is a OTC medication for sinus congestion.           BP 148/110  Pulse 101  Temp(Src) 97.6 F (36.4 C) (Rectal)  Resp 24  Ht 5\' 8"  (1.727 m)  Wt 450 lb (204.119 kg)  BMI 68.44 kg/m2  SpO2 94%  Physical Exam  Nursing note and vitals reviewed. Constitutional:  Morbidly obese, ill-appearing  HENT:  Head: Normocephalic and atraumatic.  Mouth/Throat: Oropharynx is clear and moist. No oropharyngeal exudate.  Eyes: Conjunctivae and EOM are normal. Pupils are equal, round, and reactive to light. Right eye exhibits no discharge. Left eye exhibits no discharge. No scleral icterus.  Neck: Normal range of motion. Neck supple. No JVD present. No thyromegaly present.  Cardiovascular: Normal heart sounds and intact distal pulses.  Exam reveals no gallop and no  friction rub.   No murmur heard. Mild tachycardia, irreg irreg rhythm, strong peripheral pulses, no JVD  Pulmonary/Chest: Effort normal and breath sounds normal. No respiratory distress. He has no wheezes. He has no rales.  Abdominal: Bowel sounds are normal.  Severely obese abdomen which is minimally tender, mass in the periumbilical region just superior to his healing wound, gauze removed from wound, no foul discharge or purulent material seen, no peritoneal signs, no guarding  Musculoskeletal: Normal range of motion. He exhibits edema. He exhibits no tenderness.  Legs are symmetrical, edematous, non-erythematous, no increased warmth, no drainage   Lymphadenopathy:    He has no cervical adenopathy.  Neurological: He is alert. Coordination normal.  The patient is alert and following commands, he has no focal weakness and is able to lift all 4 extremities with 5 out of 5 strength, normal grips, normal speech, normal cranial nerves III through XII, normal sensation to light touch in all 4 extremities  Skin: Skin is warm and dry. No rash noted. No erythema.  Psychiatric: He has a normal mood and affect. His behavior is normal.    ED Course  Procedures (including critical care time)  Labs Reviewed  CBC WITH DIFFERENTIAL - Abnormal; Notable for the following:    Platelets 143 (*)    Neutrophils Relative 79 (*)    All other components within normal limits  COMPREHENSIVE METABOLIC PANEL - Abnormal; Notable for the following:    Glucose, Bld 157 (*)    GFR calc non Af Amer 70 (*)    GFR calc Af Amer 81 (*)    All other components within normal limits  APTT  PROTIME-INR  LACTIC ACID, PLASMA  LIPASE, BLOOD  TROPONIN I  URINALYSIS, ROUTINE W REFLEX MICROSCOPIC  URINE RAPID DRUG SCREEN (HOSP PERFORMED)   Dg Abd Acute W/chest  01/16/2013  *RADIOLOGY REPORT*  Clinical Data: Nausea, emesis and weakness  ACUTE ABDOMEN SERIES (ABDOMEN 2 VIEW & CHEST 1 VIEW)  Comparison: 08/06/2008  Findings: Diminished exam detail due to motion artifact and patient's body habitus.  The heart size appears enlarged.  The lungs are under inflated.  There is no pleural effusion or edema identified.  No airspace consolidation identified.  The bowel gas pattern is significantly diminished due to body habitus.  No dilated loops of small bowel or fluid levels identified.  IMPRESSION:  1.  Significantly diminished exam detail due to motion artifact and patient's body habitus. 2.  Cardiac enlargement. 3.  No specific features to suggest bowel obstruction.   Original Report Authenticated By: Signa Kell, M.D.      1. Atrial fibrillation with rapid ventricular response    2. Nausea and vomiting   3. Encephalopathy   4. Hypertension       MDM  At this time the etiology of the patient's nausea and vomiting his uncertain, laboratory workup and CT scan of the abdomen pending to rule out an incarcerated hernia, bowel obstruction or other intra-abdominal pathology. Zofran has been given, EKG, antiemetics.  ED ECG REPORT  I personally interpreted this EKG   Date: 01/16/2013   Rate: 111   Rhythm: atrial fibrillation and with RVR  QRS Axis: normal  Intervals: normal  ST/T Wave abnormalities: nonspecific ST/T changes  Conduction Disutrbances:nonspecific intraventricular conduction delay  Narrative Interpretation:   Old EKG Reviewed: changes noted and since last tracing has afib now in place of NSR  ECG shows new onset afib with RVR - The pt does not have hx of this,  he is not anticoagulated and takes no rate control medicines.  He currently has good pulses and is hypertensive, not hypotensive.  Due to his n/v will give fluids and control his symptoms prior to rate control. He is afebrile.  Filed Vitals:   01/16/13 0645 01/16/13 0658 01/16/13 0850  BP: 181/96  148/110  Pulse: 73  101  Temp: 94.5 F (34.7 C) 97.6 F (36.4 C)   TempSrc: Oral Rectal   Resp: 24  24  Height: 5\' 8"  (1.727 m)    Weight: 450 lb (204.119 kg)    SpO2: 95%  94%   The patient is continuously in atrial fibrillation with a rapid response, pulses range between 101 115 beats per minute. Repeat EKG confirms ongoing atrial fibrillation. Family members are now to bedside and state that he has not had this in the past, has normal mental status is awake alert and ambulatory. At this time the patient is somnolent, arousable and follows commands but appears very sleepy. He is too heavy for the CT scan are thus a CT of the abdomen nor a CT scan of the head could be performed. An acute abdominal series did not show any signs of bowel obstruction in his laboratory data is relatively  normal.  Cardizem has been ordered as a bolus and a drip to help with rate control, critical care is being provided for both his encephalopathy and his abnormal cardiac rhythm. I discussed his care with his family and the internal medicine physician on-call, Dr. Karilyn Cota who will admit the patient to the hospital.  CRITICAL CARE Performed by: Vida Roller   Total critical care time:35  Critical care time was exclusive of separately billable procedures and treating other patients.  Critical care was necessary to treat or prevent imminent or life-threatening deterioration.  Critical care was time spent personally by me on the following activities: development of treatment plan with patient and/or surrogate as well as nursing, discussions with consultants, evaluation of patient's response to treatment, examination of patient, obtaining history from patient or surrogate, ordering and performing treatments and interventions, ordering and review of laboratory studies, ordering and review of radiographic studies, pulse oximetry and re-evaluation of patient's condition.      Vida Roller, MD 01/16/13 [phone]

## 2013-01-16 NOTE — ED Notes (Signed)
Per EMS: Pt c/o N/V and generalized weakness.  Vomited twice in route. Pt actively vomiting during triage.

## 2013-01-16 NOTE — Progress Notes (Signed)
When patient is resting or sleeping oxygen drops down (sometimes into mid 80s) attempted to place 2L [place] on patient to keep sats WNL; Patient stated he could not wear it when he is sleeping. Told patient that his oxygen drops when he is sleeping and the oxygen would be helpful. Again patient declined. Will continue to monitor; patients other Vital signs WNL. Oxygen sats 91% at this time on RA.

## 2013-01-16 NOTE — H&P (Signed)
[facility] History and Physical  Jeremy Rush ZOX:[id number] DOB: 03-03-1951 DOA: 01/16/2013  Referring physician: Dr. Hyacinth Meeker, ER physician.    Chief Complaint: Vomiting.  HPI: Jeremy Rush is a 62 y.o. male presents to the hospital with a near syncopal episode related to several episodes of vomiting since 4 AM today. He denies any significant abdominal pain with it. There was no chest pain associated with it. He has not had any diarrhea. There is no hematemesis or rectal bleeding. There is no fever. When he presented to the emergency room, he was found to be in atrial fibrillation with rapid ventricular response. Cardizem drip has been started. He is morbidly obese.   Review of Systems: Apart from history of present illness, other systems negative.  Past Medical History  Diagnosis Date  . Morbid obesity   . Hypertension   . Arthritis   . Wheezing    Past Surgical History  Procedure Laterality Date  . Ventral hernia repair    . Debridement abdominal wound  2009   Social History:  He is single, lives alone. He does not smoke and does not drink alcohol. He is currently not working.  Allergies  Allergen Reactions  . Codeine Other (See Comments)    Mood swing  . Morphine And Related Other (See Comments)    Mood swing    No family history on file. noncontributory.   Prior to Admission medications   Medication Sig Start Date End Date Taking? Authorizing Provider  ibuprofen (ADVIL,MOTRIN) 800 MG tablet Take 800 mg by mouth every 8 (eight) hours as needed for pain (headache).   Yes Historical Provider, MD  lisinopril-hydrochlorothiazide (PRINZIDE,ZESTORETIC) 20-25 MG per tablet Take 1 tablet by mouth daily.   Yes Historical Provider, MD  loratadine (CLARITIN) 10 MG tablet Take 10 mg by mouth daily.   Yes Historical Provider, MD  OVER THE COUNTER MEDICATION Take 1 tablet by mouth daily as needed (congestion.). States that this is a OTC medication for sinus congestion.    Yes Historical Provider, MD   Physical Exam: Filed Vitals:   01/16/13 8119 01/16/13 0850 01/16/13 0900 01/16/13 0946  BP:  148/110 149/108 140/89  Pulse:  101 81 74  Temp: 97.6 F (36.4 C)     TempSrc: Rectal     Resp:  24 21 18   Height:      Weight:      SpO2:  94% 93% 94%     General:  He is morbidly obese. He weighs 450 pounds. Is not toxic or septic looking.  Eyes: No pallor. No jaundice.  ENT: Thick neck.  Neck: No lymphadenopathy.  Cardiovascular: Heart sounds are irregularly irregular consistent with atrial fibrillation. Jugular venous pressure not elevated. He does have significant peripheral pitting edema in his legs.  Respiratory: Lung fields show bilateral scattered wheezing.  Abdomen: Obese. Has a wound in the umbilical area from his chronic abdominal wound/training sinus secondary to previous surgery 1999 for a ventral hernia by Dr. Maple Hudson and subsequent department and September 2009 by Dr. Ezzard Standing, surgery.  Skin: No specific rashes.  Musculoskeletal: No major joint abnormalities.  Psychiatric: Appropriate affect, somewhat blunted.  Neurologic: Alert and orientated currently without any focal neurological signs.  Labs on Admission:  Basic Metabolic Panel:  Recent Labs Lab 01/16/13 0713  NA 137  K 3.8  CL 100  CO2 31  GLUCOSE 157*  BUN 16  CREATININE 1.12  CALCIUM 8.8   Liver Function Tests:  Recent Labs Lab 01/16/13  0713  AST 11  ALT 8  ALKPHOS 65  BILITOT 0.9  PROT 7.1  ALBUMIN 3.6    Recent Labs Lab 01/16/13 0713  LIPASE 16    CBC:  Recent Labs Lab 01/16/13 0713  WBC 6.3  NEUTROABS 5.0  HGB 14.5  HCT 43.8  MCV 84.4  PLT 143*   Cardiac Enzymes:  Recent Labs Lab 01/16/13 0713  TROPONINI <0.30     Radiological Exams on Admission: Dg Abd Acute W/chest  01/16/2013  *RADIOLOGY REPORT*  Clinical Data: Nausea, emesis and weakness  ACUTE ABDOMEN SERIES (ABDOMEN 2 VIEW & CHEST 1 VIEW)  Comparison: 08/06/2008   Findings: Diminished exam detail due to motion artifact and patient's body habitus.  The heart size appears enlarged.  The lungs are under inflated.  There is no pleural effusion or edema identified.  No airspace consolidation identified.  The bowel gas pattern is significantly diminished due to body habitus.  No dilated loops of small bowel or fluid levels identified.  IMPRESSION:  1.  Significantly diminished exam detail due to motion artifact and patient's body habitus. 2.  Cardiac enlargement. 3.  No specific features to suggest bowel obstruction.   Original Report Authenticated By: Signa Kell, M.D.     EKG: Independently reviewed. Atrial fibrillation, no acute ST-T wave changes.  Assessment/Plan   1. Atrial fibrillation with rapid ventricular response. 2. Vomiting, unclear etiology. 3. Morbid obesity. 4. Chronic wound infection. 5. Hypertension. 6. Possible congestive heart failure.  Plan: 1. Admit to step down unit as patient has a Cardizem drip currently. 2. Intravenous Lasix. Check BNP. 3. Check lipase. 4. Check TSH. Further recommendations will depend on patient's hospital progress.   Code Status: Full code.   Family Communication: Discussed plan with patient at the bedside.   Disposition Plan: Home when medically stable.   Time spent: 45 minutes.  Wilson Singer [facility] Pager [phone].  If 7PM-7AM, please contact night-coverage [email or web address] Password [email or web address] 01/16/2013, 10:20 AM

## 2013-01-17 ENCOUNTER — Encounter (HOSPITAL_COMMUNITY): Payer: Self-pay | Admitting: Cardiology

## 2013-01-17 DIAGNOSIS — I10 Essential (primary) hypertension: Secondary | ICD-10-CM | POA: Diagnosis present

## 2013-01-17 DIAGNOSIS — I517 Cardiomegaly: Secondary | ICD-10-CM

## 2013-01-17 DIAGNOSIS — Z7901 Long term (current) use of anticoagulants: Secondary | ICD-10-CM

## 2013-01-17 DIAGNOSIS — I4891 Unspecified atrial fibrillation: Principal | ICD-10-CM | POA: Diagnosis present

## 2013-01-17 LAB — TSH: TSH: 1.918 u[IU]/mL (ref 0.350–4.500)

## 2013-01-17 LAB — COMPREHENSIVE METABOLIC PANEL
Albumin: 3.8 g/dL (ref 3.5–5.2)
BUN: 12 mg/dL (ref 6–23)
Creatinine, Ser: 1.11 mg/dL (ref 0.50–1.35)
Total Bilirubin: 1.3 mg/dL — ABNORMAL HIGH (ref 0.3–1.2)
Total Protein: 7.7 g/dL (ref 6.0–8.3)

## 2013-01-17 LAB — CBC
HCT: 46.8 % (ref 39.0–52.0)
MCV: 87.6 fL (ref 78.0–100.0)
Platelets: 203 10*3/uL (ref 150–400)
RBC: 5.34 MIL/uL (ref 4.22–5.81)
WBC: 6.2 10*3/uL (ref 4.0–10.5)

## 2013-01-17 MED ORDER — RIVAROXABAN 10 MG PO TABS
20.0000 mg | ORAL_TABLET | Freq: Every day | ORAL | Status: DC
  Administered 2013-01-17 – 2013-01-18 (×2): 20 mg via ORAL
  Filled 2013-01-17 (×2): qty 2

## 2013-01-17 MED ORDER — DILTIAZEM HCL 60 MG PO TABS
60.0000 mg | ORAL_TABLET | Freq: Four times a day (QID) | ORAL | Status: DC
  Administered 2013-01-17 – 2013-01-18 (×5): 60 mg via ORAL
  Filled 2013-01-17 (×5): qty 1

## 2013-01-17 MED ORDER — OXYCODONE HCL 5 MG PO TABS
5.0000 mg | ORAL_TABLET | ORAL | Status: DC | PRN
  Administered 2013-01-17 – 2013-01-18 (×3): 5 mg via ORAL
  Filled 2013-01-17 (×3): qty 1

## 2013-01-17 MED ORDER — FUROSEMIDE 40 MG PO TABS
40.0000 mg | ORAL_TABLET | Freq: Every day | ORAL | Status: DC
  Administered 2013-01-18: 40 mg via ORAL
  Filled 2013-01-17: qty 1

## 2013-01-17 MED ORDER — DILTIAZEM HCL 30 MG PO TABS: 30.0000 mg | ORAL_TABLET | Freq: Four times a day (QID) | ORAL | Status: DC

## 2013-01-17 MED ORDER — OXYCODONE HCL 5 MG PO TABS
ORAL_TABLET | ORAL | Status: AC
  Filled 2013-01-17: qty 1

## 2013-01-17 MED ORDER — OXYCODONE HCL 5 MG PO TABS
5.0000 mg | ORAL_TABLET | Freq: Once | ORAL | Status: AC
  Administered 2013-01-17: 5 mg via ORAL

## 2013-01-17 NOTE — Progress Notes (Signed)
Patient oxygen sats continue to drop when patient sleeps; tried to convince patient that it is best to wear oxygen. Refused the nasal canula again; ELINK came in by the time O2 sats were in the 70s and we begged him to wear oxygen. He reluctantly said she would try the Venti mask. Placed patient on 35% Venti mask at this time. Oxygen levels up to 96% now and other vital signs WNL. Will continue to monitor patient.

## 2013-01-17 NOTE — Consult Note (Signed)
Patient Name: Jeremy Rush  MRN: [id number]  HPI: Jeremy Rush is an 62 y.o. male referred for consultation by Dr.Nimish Normajean Glasgow, MD for atrial fibrillation.  Patient has enjoyed generally good health and was athletic and active up until age 67, but subsequently has gained substantial weight and become sedentary. He has a history of hypertension, and medical care has been hit and miss primarily as the result of financial issues. He currently has Medicaid coverage and receives primary [facility]. He has no cardiac history has never been previously evaluated by a cardiologist. He was last in the hospital 5 years ago when he required surgical intervention for her infected mesh as a late complication of 4 surgical procedures for repair of abdominal wall hernia in the 1990s. He has had mild continuing drainage from the surgical site and a small chronic wound managed by Dr. Ezzard Standing.  On the day of admission, he experienced an episode of near-syncope with subsequent nausea and emesis prompting a call to his family, who arranged transport to the emergency department. Initial testing has been generally negative. Blood pressure has been well-controlled.  Past Medical History  Diagnosis Date  . Morbid obesity   . Hypertension   . DJD (degenerative joint disease)   . Atrial fibrillation 2014    Initial diagnosis in 12/2012   Past Surgical History  Procedure Laterality Date  . Ventral hernia repair  1990s-2000    X4  . Abdominal debridement  2009    Dr. Ovidio Kin, infected mesh/abscess   History reviewed. No pertinent family history.  Social History:  reports that he has never smoked. He has never used smokeless tobacco. He reports that he does not drink alcohol or use illicit drugs.  Allergies:  Allergies  Allergen Reactions  . Codeine Other (See Comments)    Mood swing  . Morphine And Related Other (See Comments)    Mood swing   Medications:  I have reviewed the  patient's current medications. Scheduled: . diltiazem  60 mg Oral Q6H  . furosemide  40 mg Intravenous Q12H  . heparin  5,000 Units Subcutaneous Q8H  . lisinopril  20 mg Oral Daily   And  . hydrochlorothiazide  25 mg Oral Daily  . loratadine  10 mg Oral Daily  . sodium chloride  3 mL Intravenous Q12H       Result Value Range   Pro B Natriuretic peptide (BNP) 1057.0 (*) 0 - 125 pg/mL    01/16/13 10:30 AM      Result Value Range   O2 Content 2.0     Delivery systems NASAL CANNULA     pH, Arterial 7.326 (*) 7.350 - 7.450   pCO2 arterial 51.7 (*) 35.0 - 45.0 mmHg   pO2, Arterial 96.9  80.0 - 100.0 mmHg   Bicarbonate 26.3 (*) 20.0 - 24.0 mEq/L   TCO2 23.3  0 - 100 mmol/L   Acid-Base Excess 1.0  0.0 - 2.0 mmol/L   O2 Saturation 97.5    TSH     Status: None   Collection Time    01/16/13 12:59 PM      Result Value Range   TSH 1.918  0.350 - 4.500 uIU/mL      Result Value Range   Troponin I <0.30  <0.30 ng/mL    01/16/13  7:13 PM      Result Value Range   Troponin I <0.30  <0.30 ng/mL  COMPREHENSIVE METABOLIC PANEL  Status: Abnormal   Collection Time    01/17/13  8:26 AM      Result Value Range   Sodium 136  135 - 145 mEq/L   Potassium 4.1  3.5 - 5.1 mEq/L   Chloride 96  96 - 112 mEq/L   CO2 33 (*) 19 - 32 mEq/L   Glucose, Bld 133 (*) 70 - 99 mg/dL   BUN 12  6 - 23 mg/dL   Creatinine, Ser 4.54  0.50 - 1.35 mg/dL   Calcium 9.2  8.4 - 09.8 mg/dL   Total Protein 7.7  6.0 - 8.3 g/dL   Albumin 3.8  3.5 - 5.2 g/dL   AST 18  0 - 37 U/L   ALT 9  0 - 53 U/L   Alkaline Phosphatase 74  39 - 117 U/L   Total Bilirubin 1.3 (*) 0.3 - 1.2 mg/dL   GFR calc non Af Amer 70 (*) >90 mL/min   GFR calc Af Amer 82 (*) >90 mL/min  CBC     Status: None   Collection Time    01/17/13  8:26 AM      Result Value Range   WBC 6.2  4.0 - 10.5 K/uL   RBC 5.34  4.22 - 5.81 MIL/uL   Hemoglobin 14.9  13.0 - 17.0 g/dL   HCT 11.9  14.7 - 82.9 %   MCV 87.6  78.0 - 100.0 fL   MCH 27.9  26.0 - 34.0 pg    MCHC 31.8  30.0 - 36.0 g/dL   RDW 56.2  13.0 - 86.5 %   Platelets 203  150 - 400 K/uL   Comment: DELTA CHECK NOTED  Dg Abd Acute W/chest  01/16/2013:  motion artifact and patient's body habitus. 2.  Cardiac enlargement.  Review of Systems: General: Long-standing obesity without recent weight gain Cardiac: no chest pain, orthopnea, PND,  or syncope; chronic class II-III dyspnea on exertion Respiratory: no cough, sputum production or hemoptysis GI: no abdominal pain, diarrhea or constipation Integument: no significant lesions Neurologic: No muscle weakness or paralysis; no speech disturbance; no headache All other systems reviewed and are negative.  Physical Exam: Blood pressure 99/84, pulse 82, temperature 99.3 F (37.4 C), temperature source Axillary, resp. rate 24, height 5\' 8"  (1.727 m), weight 189.2 kg (417 lb 1.8 oz), SpO2 92.00%.;  Body mass index is 63.44 kg/(m^2). General-Well-developed; no acute distress; obese HEENT-[place]/AT; PERRL; EOM intact; conjunctiva and lids nl Neck-No JVD; no carotid bruits Endocrine-No thyromegaly Lungs-Clear lung fields with decreased breath sounds throughout; resonant percussion; normal I-to-E ratio Cardiovascular- nonpalpable PMI; distant S1 and S2; irregular rhythm Abdomen-BS normal; soft and non-tender without masses or organomegaly; bandage over the mid abdominal wall Musculoskeletal-No deformities, cyanosis or clubbing Neurologic-Nl cranial nerves; symmetric strength and tone Skin- Warm, no significant lesions Extremities-distal pulses present; 1+ edema; mild chronic stasis  EKG:  Atrial fibrillation with rapid ventricular response; ventricular rate of 111 bpm; right ventricular conduction delay; delayed R-wave progression; occasional complex with aberrant conduction  Assessment/Plan:  Atrial fibrillation:  Duration of arrhythmia is uncertain. Patient has limited contact with medical facilities and may have had atrial fibrillation for months  or years that has been undetected; however, the onset of symptoms with near syncope the evening of admission could also represent the onset of arrhythmia.   An episode of near-syncope also raises a concern for sick sinus syndrome and possible bradycardia arrhythmias in addition to atrial fibrillation with a rapid ventricular response. He has no apparent  structural heart disease other than that possibly associated with Pickwickian Syndrome or sleep apnea and pulmonary hypertension.  Heart rate control appears adequate with current dose of diltiazem.  Can be converted to a long-acting preparation at discharge.   Chronic respiratory acidosis: Arterial blood gas result is consistent with Pickwickian Syndrome, as resting PCO2 was significantly elevated. He appears willing to undergo a sleep study, which can be arranged as an outpatient.  Patient may need positive pressure mask at night and will certainly need oxygen at a minimum.  Mild to moderate elevation in BNP level is consistent with pulmonary problems-there is no evidence for congestive heart failure. Furosemide will be converted to oral dosing.    Anticoagulation is warranted.   Rivaroxaban started and subcutaneous heparin discontinued.  Lower extremity edema and decreased distal pulses: lower extremity duplex examination will be requested.  No further inpatient evaluation is necessary, and patient can probably be discharged 2/28 from a cardiology standpoint.  Weight loss would be highly desirable.  IV fluids discontinued.   [place] Bing, MD 01/17/2013, 5:41 PM

## 2013-01-17 NOTE — Consult Note (Signed)
Consult requested by: Dr. Karilyn Cota Consult requested for respiratory failure:  HPI: This is a [age]-year-old who was admitted to the hospital because of vomiting. He was found to have atrial fibrillation with rapid ventricular response. He has been treated with Cardizem drip and his heart rate is better. He is unaware of any previous episodes similar to this. He was noted to have desaturation of oxygen while he was asleep and requiring oxygen mask. He is morbidly obese. He says he feels well and he has very little insight into his problem  Past Medical History  Diagnosis Date  . Morbid obesity   . Hypertension   . Arthritis   . Wheezing      History reviewed. No pertinent family history.   History   Social History  . Marital Status: Single    Spouse Name: N/A    Number of Children: N/A  . Years of Education: N/A   Social History Main Topics  . Smoking status: Never Smoker   . Smokeless tobacco: Never Used  . Alcohol Use: No  . Drug Use: No  . Sexually Active: None   Other Topics Concern  . None   Social History Narrative  . None     ROS: He denies any shortness of breath. He has some cough. He has no chest pain.    Objective: Vital signs in last 24 hours: Temp:  [97.4 F (36.3 C)-97.9 F (36.6 C)] 97.9 F (36.6 C) (02/27 0800) Pulse Rate:  [25-137] 92 (02/27 0645) Resp:  [15-31] 16 (02/27 0645) BP: (90-164)/(58-114) 142/74 mmHg (02/27 0645) SpO2:  [79 %-99 %] 90 % (02/27 0645) FiO2 (%):  [35 %] 35 % (02/27 0059) Weight:  [189.2 kg (417 lb 1.8 oz)] 189.2 kg (417 lb 1.8 oz) (02/27 0500) Weight change: -14.919 kg (-32 lb 14.2 oz) Last BM Date: 01/15/13  Intake/Output from previous day: 02/26 0701 - 02/27 0700 In: 1276.6 [P.O.:120; I.V.:1156.6] Out: 5000 [Urine:5000]  PHYSICAL EXAM He is awake and alert. He is morbidly obese. He is wearing oxygen. His pupils are reactive. His nose and throat are clear. His neck is supple. His chest shows markedly decreased  breath sounds but no wheezes now. His heart is irregular with a rate of about 70. His abdomen is soft obese without masses extremities show chronic venous stasis and trace edema  Lab Results: Basic Metabolic Panel:  Recent Labs  16/10/96 0713  NA 137  K 3.8  CL 100  CO2 31  GLUCOSE 157*  BUN 16  CREATININE 1.12  CALCIUM 8.8   Liver Function Tests:  Recent Labs  01/16/13 0713  AST 11  ALT 8  ALKPHOS 65  BILITOT 0.9  PROT 7.1  ALBUMIN 3.6    Recent Labs  01/16/13 0713  LIPASE 16  16   No results found for this basename: AMMONIA,  in the last 72 hours CBC:  Recent Labs  01/16/13 0713  WBC 6.3  NEUTROABS 5.0  HGB 14.5  HCT 43.8  MCV 84.4  PLT 143*   Cardiac Enzymes:  Recent Labs  01/16/13 0713 01/16/13 1259 01/16/13 1913  TROPONINI <0.30 <0.30 <0.30   BNP:  Recent Labs  01/16/13 0713  PROBNP 1057.0*   D-Dimer: No results found for this basename: DDIMER,  in the last 72 hours CBG: No results found for this basename: GLUCAP,  in the last 72 hours Hemoglobin A1C: No results found for this basename: HGBA1C,  in the last 72 hours Fasting Lipid Panel: No  results found for this basename: CHOL, HDL, LDLCALC, TRIG, CHOLHDL, LDLDIRECT,  in the last 72 hours Thyroid Function Tests:  Recent Labs  01/16/13 1259  TSH 1.918   Anemia Panel: No results found for this basename: VITAMINB12, FOLATE, FERRITIN, TIBC, IRON, RETICCTPCT,  in the last 72 hours Coagulation:  Recent Labs  01/16/13 0713  LABPROT 14.3  INR 1.13   Urine Drug Screen: Drugs of Abuse     Component Value Date/Time   LABOPIA NONE DETECTED 01/16/2013 1015   COCAINSCRNUR NONE DETECTED 01/16/2013 1015   LABBENZ NONE DETECTED 01/16/2013 1015   AMPHETMU NONE DETECTED 01/16/2013 1015   THCU NONE DETECTED 01/16/2013 1015   LABBARB NONE DETECTED 01/16/2013 1015    Alcohol Level: No results found for this basename: ETH,  in the last 72 hours Urinalysis:  Recent Labs  01/16/13 1015   COLORURINE YELLOW  LABSPEC >1.030*  PHURINE 6.0  GLUCOSEU NEGATIVE  HGBUR NEGATIVE  BILIRUBINUR NEGATIVE  KETONESUR NEGATIVE  PROTEINUR TRACE*  UROBILINOGEN 0.2  NITRITE NEGATIVE  LEUKOCYTESUR NEGATIVE   Misc. Labs:   ABGS:  Recent Labs  01/16/13 1030  PHART 7.326*  PO2ART 96.9  TCO2 23.3  HCO3 26.3*     MICROBIOLOGY: Recent Results (from the past 240 hour(s))  MRSA PCR SCREENING     Status: None   Collection Time    01/16/13 11:20 AM      Result Value Range Status   MRSA by PCR NEGATIVE  NEGATIVE Final   Comment:            The GeneXpert MRSA Assay (FDA     approved for NASAL specimens     only), is one component of a     comprehensive MRSA colonization     surveillance program. It is not     intended to diagnose MRSA     infection nor to guide or     monitor treatment for     MRSA infections.    Studies/Results: Dg Abd Acute W/chest  01/16/2013  *RADIOLOGY REPORT*  Clinical Data: Nausea, emesis and weakness  ACUTE ABDOMEN SERIES (ABDOMEN 2 VIEW & CHEST 1 VIEW)  Comparison: 08/06/2008  Findings: Diminished exam detail due to motion artifact and patient's body habitus.  The heart size appears enlarged.  The lungs are under inflated.  There is no pleural effusion or edema identified.  No airspace consolidation identified.  The bowel gas pattern is significantly diminished due to body habitus.  No dilated loops of small bowel or fluid levels identified.  IMPRESSION:  1.  Significantly diminished exam detail due to motion artifact and patient's body habitus. 2.  Cardiac enlargement. 3.  No specific features to suggest bowel obstruction.   Original Report Authenticated By: Signa Kell, M.D.     Medications:  Scheduled: . diltiazem  60 mg Oral Q6H  . furosemide  40 mg Intravenous Q12H  . heparin  5,000 Units Subcutaneous Q8H  . lisinopril  20 mg Oral Daily   And  . hydrochlorothiazide  25 mg Oral Daily  . loratadine  10 mg Oral Daily  . sodium chloride  3 mL  Intravenous Q12H   Continuous: . sodium chloride 50 mL/hr at 01/17/13 0600   UJW:[id number], ondansetron (ZOFRAN) IV, ondansetron  Assesment: He has atrial fibrillation. He has been in rapid atrial fib better now. He is morbidly obese and I think he has obesity hypoventilation probably sleep apnea. He expresses no interest in having this investigated at this point Principal Problem:  AF (atrial fibrillation) Active Problems:   Wound infection, chronic anterior abdominal abscess.   Morbid obesity   Vomiting   HTN (hypertension)    Plan: If he would agree I would try to get him a sleep study done. He may have gone into atrial fib because of sleep apnea.    LOS: 1 day   Edom Schmuhl L 01/17/2013, 8:50 AM

## 2013-01-17 NOTE — Progress Notes (Signed)
*  PRELIMINARY RESULTS* Echocardiogram 2D Echocardiogram has been performed.  Jeremy Rush 01/17/2013, 10:20 AM 

## 2013-01-17 NOTE — Progress Notes (Signed)
Subjective: This man was admitted yesterday with vomiting, which does not seem to be an issue now and atrial fibrillation with rapid ventricular response. He was started on a Cardizem drip. His symptoms seem to improve. He did desaturate tonight when he was asleep. I'm sure he has obstructive sleep apnea.           Physical Exam: Blood pressure 142/74, pulse 92, temperature 97.4 F (36.3 C), temperature source Axillary, resp. rate 16, height 5\' 8"  (1.727 m), weight 189.2 kg (417 lb 1.8 oz), SpO2 90.00%. He looks systemically well. His ventricular rate is much better controlled on the Cardizem drip. His chemotherapy stable. Lung fields are clear. Peripheral dinner his legs is improved. He is alert and oriented. Abdomen is soft and nontender.   Investigations:  Recent Results (from the past 240 hour(s))  MRSA PCR SCREENING     Status: None   Collection Time    01/16/13 11:20 AM      Result Value Range Status   MRSA by PCR NEGATIVE  NEGATIVE Final   Comment:            The GeneXpert MRSA Assay (FDA     approved for NASAL specimens     only), is one component of a     comprehensive MRSA colonization     surveillance program. It is not     intended to diagnose MRSA     infection nor to guide or     monitor treatment for     MRSA infections.     Basic Metabolic Panel:  Recent Labs  96/04/54 0713  NA 137  K 3.8  CL 100  CO2 31  GLUCOSE 157*  BUN 16  CREATININE 1.12  CALCIUM 8.8   Liver Function Tests:  Recent Labs  01/16/13 0713  AST 11  ALT 8  ALKPHOS 65  BILITOT 0.9  PROT 7.1  ALBUMIN 3.6     CBC:  Recent Labs  01/16/13 0713  WBC 6.3  NEUTROABS 5.0  HGB 14.5  HCT 43.8  MCV 84.4  PLT 143*    Dg Abd Acute W/chest  01/16/2013  *RADIOLOGY REPORT*  Clinical Data: Nausea, emesis and weakness  ACUTE ABDOMEN SERIES (ABDOMEN 2 VIEW & CHEST 1 VIEW)  Comparison: 08/06/2008  Findings: Diminished exam detail due to motion artifact and patient's  body habitus.  The heart size appears enlarged.  The lungs are under inflated.  There is no pleural effusion or edema identified.  No airspace consolidation identified.  The bowel gas pattern is significantly diminished due to body habitus.  No dilated loops of small bowel or fluid levels identified.  IMPRESSION:  1.  Significantly diminished exam detail due to motion artifact and patient's body habitus. 2.  Cardiac enlargement. 3.  No specific features to suggest bowel obstruction.   Original Report Authenticated By: Signa Kell, M.D.       Medications: I have reviewed the patient's current medications.  Impression: 1. Atrial fibrillation with rapid ventricular response, now rate is better controlled. He is on Cardizem drip currently. 2. Vomiting, unclear etiology, possibly related to #1. 3. Obstructive sleep apnea clinically. 4. Hypertension. 5. Morbid obesity. 6. Chronic abdominal abscess with wound infection, in the process of healing.     Plan: 1. Discontinue Cardizem drip. Start oral Cardizem short acting. 2. Advance diet. 3. Await echocardiogram and cardiology consultation. 4. Pulmonary consultation in regard to probable obstructive sleep apnea.     LOS: 1 day  Wilson Singer Pager [phone]  01/17/2013, 7:35 AM

## 2013-01-18 ENCOUNTER — Inpatient Hospital Stay (HOSPITAL_COMMUNITY): Payer: Medicaid Other

## 2013-01-18 DIAGNOSIS — Z7901 Long term (current) use of anticoagulants: Secondary | ICD-10-CM

## 2013-01-18 LAB — BASIC METABOLIC PANEL
CO2: 34 mEq/L — ABNORMAL HIGH (ref 19–32)
Chloride: 97 mEq/L (ref 96–112)
Creatinine, Ser: 1.41 mg/dL — ABNORMAL HIGH (ref 0.50–1.35)
Potassium: 3.9 mEq/L (ref 3.5–5.1)

## 2013-01-18 MED ORDER — DILTIAZEM HCL ER COATED BEADS 240 MG PO CP24: 240.0000 mg | ORAL_CAPSULE | Freq: Every day | ORAL | Status: DC

## 2013-01-18 MED ORDER — RIVAROXABAN 20 MG PO TABS: 20.0000 mg | ORAL_TABLET | Freq: Every day | ORAL | Status: DC

## 2013-01-18 MED ORDER — DILTIAZEM HCL ER COATED BEADS 240 MG PO CP24
240.0000 mg | ORAL_CAPSULE | Freq: Every day | ORAL | Status: DC
  Administered 2013-01-18: 240 mg via ORAL
  Filled 2013-01-18: qty 1

## 2013-01-18 NOTE — Discharge Summary (Signed)
Physician Discharge Summary  Jeremy Rush ZOX:[id number] DOB: 1951-03-21 DOA: 01/16/2013    Admit date: 01/16/2013 Discharge date: 01/18/2013  Time spent: Greater than 30 minutes  Recommendations for Outpatient Follow-up:  1. Followup with Dr. Dietrich Pates, cardiology for atrial fibrillation. 2. Followup with Dr. Juanetta Gosling regarding his probable sleep apnea and sleep study.   Discharge Diagnoses:  1. New onset atrial fibrillation probably associated with syncopal episode. 2. Probable sleep apnea. 3. Hypertension. 4. Wound infection, healing secondary to anterior abdominal wall abscess. 5. Morbid obesity.   Discharge Condition: Stable.  Diet recommendation: Low glycemic index nutrition.  Filed Weights   01/16/13 0645 01/17/13 0500 01/18/13 0500  Weight: 204.119 kg (450 lb) 189.2 kg (417 lb 1.8 oz) 188.3 kg (415 lb 2 oz)    History of present illness:  This 62 year old man presented to the hospital with symptoms of vomiting, associated with a syncopal episode. Please see initial history as outlined below, HPI: Jeremy Rush is a 62 y.o. male presents to the hospital with a near syncopal episode related to several episodes of vomiting since 4 AM today. He denies any significant abdominal pain with it. There was no chest pain associated with it. He has not had any diarrhea. There is no hematemesis or rectal bleeding. There is no fever. When he presented to the emergency room, he was found to be in atrial fibrillation with rapid ventricular response. Cardizem drip has been started. He is morbidly obese.  Hospital Course:  When the patient was seen in the emergency room, he was found to be in atrial fibrillation with rapid ventricular response. There was no other real source of his vomiting and syncopal episode and I suspect it was related to the onset of atrial fibrillation, possibly associated with a bradycardic episode. However, this has not been documented in the hospital. He was started  on a Cardizem drip which appropriately slowed his ventricular rate. He still remained in atrial fibrillation. He was seen by Dr. Dietrich Pates, cardiology, who agreed with converting him to long-acting Cardizem and also adding chronic anticoagulation with xeralto. He has been started on this. In view of his swollen legs, he underwent arterial and venous Dopplers, these were both negative. He is now stable for discharge with the control ventricular rate of his atrial fibrillation. He needs to followup with Dr. Dietrich Pates in the outpatient setting and also followup with Dr. Juanetta Gosling, who also saw him in the hospital for a probable sleep study as an he likely has sleep apnea secondary to his morbid obesity.  Procedures:  None.   Consultations:  Cardiology, Dr. Dietrich Pates.  Pulmonology, Dr. Juanetta Gosling.  Discharge Exam: Filed Vitals:   01/18/13 0600 01/18/13 0613 01/18/13 1013 01/18/13 1158  BP: 130/75 130/75 131/80   Pulse: 82     Temp:    97.4 F (36.3 C)  TempSrc:    Oral  Resp: 19     Height:      Weight:      SpO2: 98%       General: He looks systemically well. He is morbidly obese. Cardiovascular: Heart sounds are in atrial fibrillation, ventricular rate in the 70s and 80s. Hemodynamically, he is stable. There are no signs of congestive heart failure clinically. Respiratory: Lung fields are difficult to hear in view of his morbid obesity but mostly are clear. He is alert and orientated.  Discharge Instructions  Discharge Orders   Future Orders Complete By Expires     Diet - low sodium heart healthy  As directed     Increase activity slowly  As directed         Medication List    TAKE these medications       diltiazem 240 MG 24 hr capsule  Commonly known as:  CARDIZEM CD  Take 1 capsule (240 mg total) by mouth daily.     ibuprofen 800 MG tablet  Commonly known as:  ADVIL,MOTRIN  Take 800 mg by mouth every 8 (eight) hours as needed for pain (headache).      lisinopril-hydrochlorothiazide 20-25 MG per tablet  Commonly known as:  PRINZIDE,ZESTORETIC  Take 1 tablet by mouth daily.     loratadine 10 MG tablet  Commonly known as:  CLARITIN  Take 10 mg by mouth daily.     OVER THE COUNTER MEDICATION  Take 1 tablet by mouth daily as needed (congestion.). States that this is a OTC medication for sinus congestion.     Rivaroxaban 20 MG Tabs  Commonly known as:  XARELTO  Take 1 tablet (20 mg total) by mouth daily.          The results of significant diagnostics from this hospitalization (including imaging, microbiology, ancillary and laboratory) are listed below for reference.    Significant Diagnostic Studies: US Venous Img Lower Bilateral  01/18/2013  *RADIOLOGY REPORT*  Clinical Data: Swelling and discoloration.  Hypertension, atrial fibrillation.  BILATERAL LOWER EXTREMITY VENOUS DOPPLER ULTRASOUND  Technique: Gray-scale sonography with compression, as well as color and duplex ultrasound, were performed to evaluate the deep venous system from the level of the common femoral vein through the popliteal and proximal calf veins.  Comparison: None  Findings:  Normal compressibility of  the common femoral, superficial femoral, and popliteal veins, as well as the proximal calf veins.  No filling defects to suggest DVT on grayscale or color Doppler imaging.  Doppler waveforms show normal direction of venous flow, normal respiratory phasicity and response to augmentation.  IMPRESSION: No evidence of  lower extremity deep vein thrombosis.   Original Report Authenticated By: D. Andria Rhein, MD    US Arterial Seg Single  01/18/2013  *RADIOLOGY REPORT*  Clinical Data: Diminished pedal pulses.  Right lower extremity pain.  History of hypertension.  NONINVASIVE PHYSIOLOGIC VASCULAR STUDY OF BILATERAL LOWER EXTREMITIES  Technique:  Evaluation of both lower extremities were performed at rest, including calculation of ankle-brachial indices with single level  Doppler, pressure and pulse volume recording.  Comparison:  None.  Findings:  Right ABI: 1.35  Left ABI: 1.14  Right Lower Extremity: The posterior tibial artery Doppler wave form is triphasic and the dorsalis pedis wave form is biphasic. There is no evidence of significant arterial occlusive disease at rest.  Left Lower Extremity: Both posterior tibial and dorsalis pedis artery wave forms are triphasic.  There is no evidence of significant arterial occlusive disease at rest.  IMPRESSION: Normal resting ankle brachial indices with no evidence of significant arterial occlusive disease in either lower extremity at rest.   Original Report Authenticated By: Irish Lack, M.D.    Dg Abd Acute W/chest  01/16/2013  *RADIOLOGY REPORT*  Clinical Data: Nausea, emesis and weakness  ACUTE ABDOMEN SERIES (ABDOMEN 2 VIEW & CHEST 1 VIEW)  Comparison: 08/06/2008  Findings: Diminished exam detail due to motion artifact and patient's body habitus.  The heart size appears enlarged.  The lungs are under inflated.  There is no pleural effusion or edema identified.  No airspace consolidation identified.  The bowel gas pattern is significantly diminished  due to body habitus.  No dilated loops of small bowel or fluid levels identified.  IMPRESSION:  1.  Significantly diminished exam detail due to motion artifact and patient's body habitus. 2.  Cardiac enlargement. 3.  No specific features to suggest bowel obstruction.   Original Report Authenticated By: Signa Kell, M.D.     Microbiology: Recent Results (from the past 240 hour(s))  MRSA PCR SCREENING     Status: None   Collection Time    01/16/13 11:20 AM      Result Value Range Status   MRSA by PCR NEGATIVE  NEGATIVE Final   Comment:            The GeneXpert MRSA Assay (FDA     approved for NASAL specimens     only), is one component of a     comprehensive MRSA colonization     surveillance program. It is not     intended to diagnose MRSA     infection nor to guide  or     monitor treatment for     MRSA infections.     Labs: Basic Metabolic Panel:  Recent Labs Lab 01/16/13 0713 01/17/13 0826 01/18/13 0447  NA 137 136 139  K 3.8 4.1 3.9  CL 100 96 97  CO2 31 33* 34*  GLUCOSE 157* 133* 95  BUN 16 12 16   CREATININE 1.12 1.11 1.41*  CALCIUM 8.8 9.2 9.2   Liver Function Tests:  Recent Labs Lab 01/16/13 0713 01/17/13 0826  AST 11 18  ALT 8 9  ALKPHOS 65 74  BILITOT 0.9 1.3*  PROT 7.1 7.7  ALBUMIN 3.6 3.8    Recent Labs Lab 01/16/13 0713  LIPASE 16  16    CBC:  Recent Labs Lab 01/16/13 0713 01/17/13 0826  WBC 6.3 6.2  NEUTROABS 5.0  --   HGB 14.5 14.9  HCT 43.8 46.8  MCV 84.4 87.6  PLT 143* 203   Cardiac Enzymes:  Recent Labs Lab 01/16/13 0713 01/16/13 1259 01/16/13 1913  TROPONINI <0.30 <0.30 <0.30   BNP: BNP (last 3 results)  Recent Labs  01/16/13 0713  PROBNP 1057.0*   CBG:      Signed:  Jayna Mulnix C  [facility] 01/18/2013, 3:26 PM

## 2013-01-18 NOTE — Progress Notes (Signed)
Subjective: I think he is overall about the same. He is sleepy this morning but arousable.  Objective: Vital signs in last 24 hours: Temp:  [97.3 F (36.3 C)-99.3 F (37.4 C)] 97.5 F (36.4 C) (02/28 0400) Pulse Rate:  [56-187] 82 (02/28 0600) Resp:  [18-28] 19 (02/28 0600) BP: (99-154)/(39-97) 130/75 mmHg (02/28 0613) SpO2:  [89 %-98 %] 98 % (02/28 0600) FiO2 (%):  [35 %] 35 % (02/27 2039) Weight:  [188.3 kg (415 lb 2 oz)] 188.3 kg (415 lb 2 oz) (02/28 0500) Weight change: -0.9 kg (-1 lb 15.8 oz) Last BM Date: 01/15/13  Intake/Output from previous day: 02/27 0701 - 02/28 0700 In: 2330 [P.O.:1620; I.V.:710] Out: 3200 [Urine:3200]  PHYSICAL EXAM General appearance: no distress and morbidly obese Resp: clear to auscultation bilaterally Cardio: regular rate and rhythm, S1, S2 normal, no murmur, click, rub or gallop GI: soft, non-tender; bowel sounds normal; no masses,  no organomegaly Extremities: venous stasis dermatitis noted  Lab Results:    Basic Metabolic Panel:  Recent Labs  21/30/86 0826 01/18/13 0447  NA 136 139  K 4.1 3.9  CL 96 97  CO2 33* 34*  GLUCOSE 133* 95  BUN 12 16  CREATININE 1.11 1.41*  CALCIUM 9.2 9.2   Liver Function Tests:  Recent Labs  01/16/13 0713 01/17/13 0826  AST 11 18  ALT 8 9  ALKPHOS 65 74  BILITOT 0.9 1.3*  PROT 7.1 7.7  ALBUMIN 3.6 3.8    Recent Labs  01/16/13 0713  LIPASE 16  16   No results found for this basename: AMMONIA,  in the last 72 hours CBC:  Recent Labs  01/16/13 0713 01/17/13 0826  WBC 6.3 6.2  NEUTROABS 5.0  --   HGB 14.5 14.9  HCT 43.8 46.8  MCV 84.4 87.6  PLT 143* 203   Cardiac Enzymes:  Recent Labs  01/16/13 0713 01/16/13 1259 01/16/13 1913  TROPONINI <0.30 <0.30 <0.30   BNP:  Recent Labs  01/16/13 0713  PROBNP 1057.0*   D-Dimer: No results found for this basename: DDIMER,  in the last 72 hours CBG: No results found for this basename: GLUCAP,  in the last 72  hours Hemoglobin A1C: No results found for this basename: HGBA1C,  in the last 72 hours Fasting Lipid Panel: No results found for this basename: CHOL, HDL, LDLCALC, TRIG, CHOLHDL, LDLDIRECT,  in the last 72 hours Thyroid Function Tests:  Recent Labs  01/16/13 1259  TSH 1.918   Anemia Panel: No results found for this basename: VITAMINB12, FOLATE, FERRITIN, TIBC, IRON, RETICCTPCT,  in the last 72 hours Coagulation:  Recent Labs  01/16/13 0713  LABPROT 14.3  INR 1.13   Urine Drug Screen: Drugs of Abuse     Component Value Date/Time   LABOPIA NONE DETECTED 01/16/2013 1015   COCAINSCRNUR NONE DETECTED 01/16/2013 1015   LABBENZ NONE DETECTED 01/16/2013 1015   AMPHETMU NONE DETECTED 01/16/2013 1015   THCU NONE DETECTED 01/16/2013 1015   LABBARB NONE DETECTED 01/16/2013 1015    Alcohol Level: No results found for this basename: ETH,  in the last 72 hours Urinalysis:  Recent Labs  01/16/13 1015  COLORURINE YELLOW  LABSPEC >1.030*  PHURINE 6.0  GLUCOSEU NEGATIVE  HGBUR NEGATIVE  BILIRUBINUR NEGATIVE  KETONESUR NEGATIVE  PROTEINUR TRACE*  UROBILINOGEN 0.2  NITRITE NEGATIVE  LEUKOCYTESUR NEGATIVE   Misc. Labs:  ABGS  Recent Labs  01/16/13 1030  PHART 7.326*  PO2ART 96.9  TCO2 23.3  HCO3 26.3*  CULTURES Recent Results (from the past 240 hour(s))  MRSA PCR SCREENING     Status: None   Collection Time    01/16/13 11:20 AM      Result Value Range Status   MRSA by PCR NEGATIVE  NEGATIVE Final   Comment:            The GeneXpert MRSA Assay (FDA     approved for NASAL specimens     only), is one component of a     comprehensive MRSA colonization     surveillance program. It is not     intended to diagnose MRSA     infection nor to guide or     monitor treatment for     MRSA infections.   Studies/Results: Dg Abd Acute W/chest  01/16/2013  *RADIOLOGY REPORT*  Clinical Data: Nausea, emesis and weakness  ACUTE ABDOMEN SERIES (ABDOMEN 2 VIEW & CHEST 1 VIEW)   Comparison: 08/06/2008  Findings: Diminished exam detail due to motion artifact and patient's body habitus.  The heart size appears enlarged.  The lungs are under inflated.  There is no pleural effusion or edema identified.  No airspace consolidation identified.  The bowel gas pattern is significantly diminished due to body habitus.  No dilated loops of small bowel or fluid levels identified.  IMPRESSION:  1.  Significantly diminished exam detail due to motion artifact and patient's body habitus. 2.  Cardiac enlargement. 3.  No specific features to suggest bowel obstruction.   Original Report Authenticated By: Signa Kell, M.D.     Medications:  Prior to Admission:  Prescriptions prior to admission  Medication Sig Dispense Refill  . ibuprofen (ADVIL,MOTRIN) 800 MG tablet Take 800 mg by mouth every 8 (eight) hours as needed for pain (headache).      . lisinopril-hydrochlorothiazide (PRINZIDE,ZESTORETIC) 20-25 MG per tablet Take 1 tablet by mouth daily.      Marland Kitchen loratadine (CLARITIN) 10 MG tablet Take 10 mg by mouth daily.      Marland Kitchen OVER THE COUNTER MEDICATION Take 1 tablet by mouth daily as needed (congestion.). States that this is a OTC medication for sinus congestion.       Scheduled: . diltiazem  240 mg Oral Daily  . furosemide  40 mg Oral Daily  . lisinopril  20 mg Oral Daily  . loratadine  10 mg Oral Daily  . rivaroxaban  20 mg Oral Daily  . sodium chloride  3 mL Intravenous Q12H   Continuous:  ZOX:[id number], ondansetron (ZOFRAN) IV, ondansetron, oxyCODONE  Assesment: He has morbid obesity and obesity hypoventilation. I think is very likely that he has sleep apnea and that may have been a contributing factor to his atrial fibrillation. Active Problems:   Wound infection, chronic anterior abdominal abscess.   Morbid obesity   Hypertension   Atrial fibrillation   Chronic anticoagulation    Plan: He'll need an outpatient sleep study    LOS: 2 days   Nary Sneed  L 01/18/2013, 8:00 AM

## 2013-01-18 NOTE — Care Management Note (Unsigned)
    Page 1 of 1   01/18/2013     10:48:25 AM   CARE MANAGEMENT NOTE 01/18/2013  Patient:  Jeremy Rush, Jeremy Rush   Account Number:  [id number]  Date Initiated:  01/18/2013  Documentation initiated by:  Rosemary Holms  Subjective/Objective Assessment:   Pt admitted to icu for A Fib.RVR . CM contacted [facility] regarding following pt. Per Pt and Financial Dept, Medicaid Access card shows [facility] as PCP.     Action/Plan:   Anticipated DC Date:  01/19/2013   Anticipated DC Plan:  HOME/SELF CARE      DC Planning Services  CM consult      Choice offered to / List presented to:             Status of service:  In process, will continue to follow Medicare Important Message given?   (If response is "NO", the following Medicare IM given date fields will be blank) Date Medicare IM given:   Date Additional Medicare IM given:    Discharge Disposition:    Per UR Regulation:    If discussed at Long Length of Stay Meetings, dates discussed:    Comments:  01/18/13 Rosemary Holms RN BSN CM PCP on [facility] Access Card indicates [facility].

## 2013-01-18 NOTE — Progress Notes (Signed)
Patient given discharge instructions. Patient verbalizes understanding of all discharge instructions. Patient discharged home via family. Patient alert, oriented and in stable condition at the time of discharge.

## 2013-01-24 ENCOUNTER — Encounter: Payer: Self-pay | Admitting: Adult Health

## 2013-01-24 NOTE — Progress Notes (Deleted)
   HPI: [name] is a 62-year-old patient of Dr. Park Layne Bing we are seeing on followup after initial consultation at [facility] in February 2014 in the setting of new onset atrial fibrillation. The duration of the arrhythmia was not certain. This was also associated with a near syncopal episode. He was placed on long-acting Cardizem and Xarelto prior to discharge. He is here on hospital followup for ongoing assessment and management.  Allergies  Allergen Reactions  . Codeine Other (See Comments)    Mood swing  . Morphine And Related Other (See Comments)    Mood swing    Current Outpatient Prescriptions  Medication Sig Dispense Refill  . diltiazem (CARDIZEM CD) 240 MG 24 hr capsule Take 1 capsule (240 mg total) by mouth daily.  30 capsule  0  . ibuprofen (ADVIL,MOTRIN) 800 MG tablet Take 800 mg by mouth every 8 (eight) hours as needed for pain (headache).      . lisinopril-hydrochlorothiazide (PRINZIDE,ZESTORETIC) 20-25 MG per tablet Take 1 tablet by mouth daily.      Marland Kitchen loratadine (CLARITIN) 10 MG tablet Take 10 mg by mouth daily.      Marland Kitchen OVER THE COUNTER MEDICATION Take 1 tablet by mouth daily as needed (congestion.). States that this is a OTC medication for sinus congestion.      . rivaroxaban (XARELTO) 20 MG TABS Take 1 tablet (20 mg total) by mouth daily.  30 tablet  0   No current facility-administered medications for this visit.    Past Medical History  Diagnosis Date  . Morbid obesity   . Hypertension   . DJD (degenerative joint disease)     Knees  . Atrial fibrillation 2014    Initial diagnosis in 12/2012    Past Surgical History  Procedure Laterality Date  . Ventral hernia repair  1990s-2000    X4  . Abdominal debridement  2009    Dr. Ovidio Kin, infected mesh/abscess  . Wrist surgery      Right    ZOX:WRUEAV of systems complete and found to be negative unless listed above  PHYSICAL EXAM There were no vitals taken for this visit. General: Well  developed, well nourished, in no acute distress Head: Eyes PERRLA, No xanthomas.   Normal cephalic and atramatic  Lungs: Clear bilaterally to auscultation and percussion. Heart: HRRR S1 S2, without MRG.  Pulses are 2+ & equal.            No carotid bruit. No JVD.  No abdominal bruits. No femoral bruits. Abdomen: Bowel sounds are positive, abdomen soft and non-tender without masses or                  Hernia's noted. Msk:  Back normal, normal gait. Normal strength and tone for age. Extremities: No clubbing, cyanosis or edema.  DP +1 Neuro: Alert and oriented X 3. Psych:  Good affect, responds appropriately  EKG:  ASSESSMENT AND PLAN

## 2013-01-25 ENCOUNTER — Encounter: Payer: Medicaid Other | Admitting: Adult Health

## 2013-01-27 NOTE — Progress Notes (Signed)
Reschedule

## 2013-01-29 NOTE — Progress Notes (Signed)
   HPI a 62 year old male patient of Dr. Dietrich Pates we are seeing for ongoing assessment and management of atrial fibrillation and hypertension. We saw the patient recently during hospitalization 2 weeks ago for new onset A. fib with RVR. He was evaluated and started on long-acting Cardizem and Xarelto. He states he is being medically compliant. Complains of weakness and fatigue. Not been taking medications as directed in the past and now that he is taking his medications regularly he is having hard time getting used to it. I have advised him that this is normal one started on a new antihypertensive. Otherwise he denies any chest pain dyspnea on exertion other than is usual due to his massive morbid obesity.   Allergies  Allergen Reactions  . Codeine Other (See Comments)    Mood swing  . Morphine And Related Other (See Comments)    Mood swing    Current Outpatient Prescriptions  Medication Sig Dispense Refill  . acetaminophen (TYLENOL) 500 MG tablet Take 500 mg by mouth every 6 (six) hours as needed for pain.      Marland Kitchen diltiazem (CARDIZEM CD) 240 MG 24 hr capsule Take 1 capsule (240 mg total) by mouth daily.  30 capsule  0  . furosemide (LASIX) 20 MG tablet Take 20 mg by mouth daily.      Marland Kitchen ibuprofen (ADVIL,MOTRIN) 800 MG tablet Take 800 mg by mouth every 8 (eight) hours as needed for pain (headache).      . lisinopril-hydrochlorothiazide (PRINZIDE,ZESTORETIC) 20-25 MG per tablet Take 1 tablet by mouth daily.      Marland Kitchen loratadine (CLARITIN) 10 MG tablet Take 10 mg by mouth daily.      . rivaroxaban (XARELTO) 20 MG TABS Take 1 tablet (20 mg total) by mouth daily.  30 tablet  0   No current facility-administered medications for this visit.    Past Medical History  Diagnosis Date  . Morbid obesity   . Hypertension   . DJD (degenerative joint disease)     Knees  . Atrial fibrillation 2014    Initial diagnosis in 12/2012    Past Surgical History  Procedure Laterality Date  . Ventral hernia  repair  1990s-2000    X4  . Abdominal debridement  2009    Dr. Ovidio Kin, infected mesh/abscess  . Wrist surgery      Right    VHQ:IONGEX of systems complete and found to be negative unless listed above  PHYSICAL EXAM BP 111/56  Pulse 119  Ht 5\' 8"  (1.727 m)  Wt 409 lb (185.521 kg)  BMI 62.2 kg/m2  SpO2 97% General: Well developed, well nourished, in no acute distress Head: Eyes PERRLA, No xanthomas.   Normal cephalic and atramatic  Lungs: Clear bilaterally to auscultation and percussion. Heart: HRIR S1 S2, without MRG.  Pulses are 2+ & equal.            No carotid bruit. No JVD.  No abdominal bruits. No femoral bruits. Abdomen: Bowel sounds are positive, abdomen soft and non-tender without masses or                  Hernia's noted. Morbidly obese. Msk:  Back normal, normal gait. Normal strength and tone for age. Extremities: No clubbing, cyanosis or edema.  DP +1 Neuro: Alert and oriented X 3. Psych:  Good affect, responds appropriately    ASSESSMENT AND PLAN

## 2013-01-31 ENCOUNTER — Ambulatory Visit (INDEPENDENT_AMBULATORY_CARE_PROVIDER_SITE_OTHER): Payer: Medicaid Other | Admitting: Adult Health

## 2013-01-31 ENCOUNTER — Encounter: Payer: Self-pay | Admitting: Adult Health

## 2013-01-31 VITALS — BP 111/56 | HR 119 | Ht 68.0 in | Wt >= 6400 oz

## 2013-01-31 DIAGNOSIS — I4891 Unspecified atrial fibrillation: Secondary | ICD-10-CM

## 2013-01-31 MED ORDER — LORATADINE 10 MG PO TABS: 10.0000 mg | ORAL_TABLET | Freq: Every day | ORAL | Status: DC

## 2013-01-31 MED ORDER — FUROSEMIDE 20 MG PO TABS: 20.0000 mg | ORAL_TABLET | Freq: Every day | ORAL | Status: DC

## 2013-01-31 NOTE — Assessment & Plan Note (Signed)
Heart rate is currently controlled on diltiazem, 240 mg daily. He is tolerating Xarelto without complaints of bleeding or hematoma. Blood pressure is soft but we will wait before changing medications until he has been on this for an optimal period of time. We will see him again in 3 months unless he becomes symptomatic.

## 2013-01-31 NOTE — Addendum Note (Signed)
Addended by: Meda Klinefelter D on: 01/31/2013 01:49 PM   Modules accepted: Orders

## 2013-01-31 NOTE — Patient Instructions (Addendum)
Continue same medication Call back if cannot tolerate medication    Your physician recommends that you schedule a follow-up appointment in: 3 months

## 2013-01-31 NOTE — Assessment & Plan Note (Signed)
His BMI is 62.2. I have advised him a low calorie diet and to increase his activity in an effort to lose weight. This is a significant cardiovascular risk factor along with his hypertension. He verbalizes understanding.

## 2013-01-31 NOTE — Assessment & Plan Note (Signed)
Blood pressure is a little soft today at 115/56, he is in atrial fibrillation which may make readings difficult. If necessary can decrease lisinopril to 10/12.5 mg if he remains hypotensive or begins to be symptomatic with dizziness or presyncope. We will see him again in 3 months unless symptomatic. At that time we will order a BMET to evaluate kidney function.

## 2013-01-31 NOTE — Assessment & Plan Note (Signed)
He remains on the rolled. No changes at this time

## 2013-02-07 ENCOUNTER — Ambulatory Visit (INDEPENDENT_AMBULATORY_CARE_PROVIDER_SITE_OTHER): Payer: Medicaid Other | Admitting: Surgery

## 2013-02-13 ENCOUNTER — Telehealth: Payer: Self-pay | Admitting: Adult Health

## 2013-02-13 MED ORDER — DILTIAZEM HCL ER COATED BEADS 240 MG PO CP24: 240.0000 mg | ORAL_CAPSULE | Freq: Every day | ORAL | Status: DC

## 2013-02-13 MED ORDER — RIVAROXABAN 20 MG PO TABS: 20.0000 mg | ORAL_TABLET | Freq: Every day | ORAL | Status: DC

## 2013-02-13 NOTE — Telephone Encounter (Signed)
Patient calling for refill of diltiazem (CARDIZEM CD) 240 MG 24 hr capsule and rivaroxaban (XARELTO) 20 MG TABS  [facility] in [place].

## 2013-02-13 NOTE — Telephone Encounter (Signed)
Done

## 2013-02-15 ENCOUNTER — Telehealth: Payer: Self-pay | Admitting: Adult Health

## 2013-02-15 NOTE — Telephone Encounter (Signed)
Spoke to pt whom advised that he reviewed his medication list and realized he has not taken the lisinopril hctz 20-25 in 4 weeks per ran out, pt wants to clarify if he needs to continue to take this medication per noted pt recently in office and advised he was taking the combo pill however states " I was mistaken and if she needs me to start again you will have to [facility] [place]" noted the following office notations made at recent OV:  Blood pressure is a little soft today at 115/56, he is in atrial fibrillation which may make readings difficult. If necessary can decrease lisinopril to 10/12.5 mg if he remains hypotensive or begins to be symptomatic with dizziness or presyncope. We will see him again in 3 months unless symptomatic. At that time we will order a BMET to evaluate kidney function.      Please advise

## 2013-02-15 NOTE — Telephone Encounter (Signed)
Hold off on lisinopril for now. Will see him on follow up appointment to reassess status at that time. Do not want to drop his pressure if it was already low without this medication.

## 2013-02-15 NOTE — Telephone Encounter (Signed)
Hold off on the lisinopril for now. With low BP will not need that high of a dose. Will see him on follow up appointment to reassess his status then.

## 2013-02-15 NOTE — Telephone Encounter (Signed)
Spoke to pt to advise results/instructions. Pt understood. Pt understood and noted recall in chart for f/u in 04-2013, pt advised he will not take the medication any longer, removed from pt medication list

## 2013-02-15 NOTE — Telephone Encounter (Signed)
Patient has questions regarding his medications / tgs  °

## 2013-04-16 ENCOUNTER — Other Ambulatory Visit: Payer: Self-pay | Admitting: *Deleted

## 2013-04-16 ENCOUNTER — Telehealth: Payer: Self-pay | Admitting: *Deleted

## 2013-04-16 MED ORDER — DILTIAZEM HCL ER COATED BEADS 240 MG PO TB24: 240.0000 mg | ORAL_TABLET | Freq: Every day | ORAL | Status: DC

## 2013-04-16 NOTE — Telephone Encounter (Signed)
PT NEEDS [facility] [place]

## 2013-04-16 NOTE — Telephone Encounter (Signed)
[facility] from [facility] for Prior Authorization of Diltiazem 24 hr ER 240 MG Cap #30 with 6 refills, Q.D.  Fax forward to nurse to be completed.

## 2013-04-18 ENCOUNTER — Telehealth: Payer: Self-pay | Admitting: Adult Health

## 2013-04-18 MED ORDER — DILTIAZEM HCL ER COATED BEADS 240 MG PO TB24: 240.0000 mg | ORAL_TABLET | Freq: Every day | ORAL | Status: DC

## 2013-04-18 NOTE — Telephone Encounter (Signed)
Noted pt insurance no longer covers ER Cardizem, changed to Cardizem LA per on pt formulary per insurance company changed on 04-06-13

## 2013-04-18 NOTE — Telephone Encounter (Signed)
See phone note from 04-18-13, concern addresssed

## 2013-04-18 NOTE — Telephone Encounter (Signed)
Please advise per pt has been made aware that the prior authorization request takes 48-72 hours, this nurse has contacted the insurance company for a rush, however more and more prior authorizations are prevalent and taking insurance companies more time to process,

## 2013-04-18 NOTE — Telephone Encounter (Signed)
Spoke to pt to advise results/instructions. Pt understood. Pt will keep f/u apt on 05-01-13 with kl

## 2013-04-18 NOTE — Telephone Encounter (Signed)
Patient states that he only has 1 Diltiazem left and the RX is pending authorization.  Wants to know if he can be switched to something else. / tgs

## 2013-04-18 NOTE — Telephone Encounter (Signed)
Can we call in a weeks worth of diltiazem to assist him until insurance company approves medication? This may help with the cost as it is for only a few days until full dose can be ordered for him covered by insurance. He has been on this medication for months and do not want to change as this is working for him.

## 2013-04-18 NOTE — Telephone Encounter (Signed)
Called pt pharmacy to see if the insurance has agreed to allow the medication change to go through, advised by [facility] the medication will go through and the pt can pick up tomorrow around 11 per has to order with change by insurance thus out of stock, called pt number in chart however noted the number has no vm capability will try to call pt again to advise

## 2013-05-01 ENCOUNTER — Ambulatory Visit (INDEPENDENT_AMBULATORY_CARE_PROVIDER_SITE_OTHER): Payer: Medicaid Other | Admitting: Adult Health

## 2013-05-01 ENCOUNTER — Other Ambulatory Visit: Payer: Self-pay | Admitting: *Deleted

## 2013-05-01 ENCOUNTER — Encounter: Payer: Self-pay | Admitting: Adult Health

## 2013-05-01 ENCOUNTER — Ambulatory Visit: Payer: Medicaid Other | Admitting: Adult Health

## 2013-05-01 VITALS — BP 162/115 | HR 77 | Resp 21 | Wt >= 6400 oz

## 2013-05-01 DIAGNOSIS — E669 Obesity, unspecified: Secondary | ICD-10-CM

## 2013-05-01 DIAGNOSIS — I10 Essential (primary) hypertension: Secondary | ICD-10-CM

## 2013-05-01 DIAGNOSIS — I4891 Unspecified atrial fibrillation: Secondary | ICD-10-CM

## 2013-05-01 LAB — COMPREHENSIVE METABOLIC PANEL
ALT: 8 U/L (ref 0–53)
BUN: 17 mg/dL (ref 6–23)
CO2: 28 mEq/L (ref 19–32)
Calcium: 8.9 mg/dL (ref 8.4–10.5)
Chloride: 105 mEq/L (ref 96–112)
Creat: 1.13 mg/dL (ref 0.50–1.35)
Total Bilirubin: 1 mg/dL (ref 0.3–1.2)

## 2013-05-01 MED ORDER — LISINOPRIL-HYDROCHLOROTHIAZIDE 10-12.5 MG PO TABS: 1.0000 | ORAL_TABLET | Freq: Every day | ORAL | Status: DC

## 2013-05-01 NOTE — Assessment & Plan Note (Signed)
He had not been back to surgeon for reassessment and treatment. He is advised to do so.

## 2013-05-01 NOTE — Assessment & Plan Note (Signed)
Heart rate is currently well controlled on cardizem LA. He continues to be compliant with Xarelto withou evidence of bleeding. Due to dyspnea, most likely related to his weight and abdominal girth. However, will check a CBC to evaluate for anemia.

## 2013-05-01 NOTE — Assessment & Plan Note (Addendum)
Not well controlled at this time. I rechecked his BP with a large manual cuff. 168/90. I will restart lisinopril 10/12.5 mg (half of the dose of HCTZ he was supposed to be on previously) and have him come back in one week for office visit and BP check. He has some LEE which may be related to weight and use of CCB, however he has abdominal distention as well.   I will check a Pro-BNP, CMET for evaluation for CH, liver and  kidney fx.  Will make further recommendations for medication adjustments once labs are obtained and reviewed. Close follow up is necessary.

## 2013-05-01 NOTE — Assessment & Plan Note (Signed)
Weight is up 10 lbs from previous visit. Uncertain if CHF or poor eating habits are to blame at this time.Awaiting labs.

## 2013-05-01 NOTE — Progress Notes (Signed)
   HPI: [name] is a morbidly obese 62 y/o patient of Dr. Dietrich Pates we are following for systolic dysfunction, atrial fibrillation and hypertension. He comes today dyspnec with LEE. He has not been taking lisinopril HCTZ as directed. He is under a lot of stress with his mother being very ill. He is not compliant with salt restriction either. Most recent echocardiogram was completed in February of this year concerning LVEF of "near normal" right ventricular size is normal and systolic function was mildly to moderately reduced, was no comment on PA pressure.  Allergies  Allergen Reactions  . Codeine Other (See Comments)    Mood swing  . Morphine And Related Other (See Comments)    Mood swing    Current Outpatient Prescriptions  Medication Sig Dispense Refill  . acetaminophen (TYLENOL) 500 MG tablet Take 500 mg by mouth every 6 (six) hours as needed for pain.      Marland Kitchen diltiazem (CARDIZEM LA) 240 MG 24 hr tablet Take 1 tablet (240 mg total) by mouth daily.  30 tablet  5  . furosemide (LASIX) 20 MG tablet Take 1 tablet (20 mg total) by mouth daily.  30 tablet  6  . ibuprofen (ADVIL,MOTRIN) 800 MG tablet Take 800 mg by mouth every 8 (eight) hours as needed for pain (headache).      . loratadine (CLARITIN) 10 MG tablet Take 1 tablet (10 mg total) by mouth daily.  30 tablet  6  . Rivaroxaban (XARELTO) 20 MG TABS Take 1 tablet (20 mg total) by mouth daily.  30 tablet  6   No current facility-administered medications for this visit.    Past Medical History  Diagnosis Date  . Morbid obesity   . Hypertension   . DJD (degenerative joint disease)     Knees  . Atrial fibrillation 2014    Initial diagnosis in 12/2012    Past Surgical History  Procedure Laterality Date  . Ventral hernia repair  1990s-2000    X4  . Abdominal debridement  2009    Dr. Ovidio Kin, infected mesh/abscess  . Wrist surgery      Right    ROS: Review of systems complete and found to be negative unless listed  above  PHYSICAL EXAM BP 162/115  Pulse 77  Resp 21  Wt 417 lb (189.15 kg)  BMI 63.42 kg/m2 General: Well developed, well nourished,morbidly in no acute distress Head: Eyes PERRLA, No xanthomas.   Normal cephalic and atramatic  Lungs: Clear bilaterally to auscultation, diminished in the bases. No wheezes are noted. Dyspneac. Heart: HRIR S1 S2, without MRG.  Pulses are 2+ & equal.            No carotid bruit. No JVD.  No abdominal bruits. No femoral bruits. Abdomen: Bowel sounds are positive, abdomen distended and non-tender without masses or                  Hernia's noted. Msk:  Back normal, normal gait. Normal strength and tone for age. Extremities: No clubbing, cyanosis, 1+ pre tibial edema.  DP diminished Neuro: Alert and oriented X 3. Psych:  Good affect, responds appropriately    ASSESSMENT AND PLAN

## 2013-05-01 NOTE — Patient Instructions (Addendum)
Your physician has recommended you make the following change in your medication: Lisinopril 10/12.5 mg (1 tab) once daily.  Your physician recommends that you return for lab work TODAY.  Your physician recommends that you schedule a follow-up appointment in: 1 WEEK  Low Na 1500 mg Diet  1.5 Gram Low Sodium Diet A 1.5 gram sodium diet restricts the amount of salt in your diet. You can have no more than 1.5 grams (1500 miligrams) in 1 day. This can help lessen your risk for developing high blood pressure. This diet may also reduce your chance of having a heart attack or stroke. It is important that you know what to look for when choosing foods and drinks.  HOME CARE   Do not add salt to food.  Avoid convenience items and fast food.  Choose unsalted snack foods.  Buy products labeled "low sodium" or "no salt added" when possible.  Check food labels to learn how much sodium is in 1 serving.  When eating at a restaurant, ask that your food be prepared with less salt or none, if possible. The nutrition facts label is a good place to find how much sodium is in foods. Look for products with no more than 400 mg of sodium per serving. Remember that 1.5 g = 1500 mg. CHOOSING FOODS Grains  Avoid: Salted crackers and snack items. Some cereals, including instant hot cereals. Bread stuffing and biscuit mixes. Seasoned rice or pasta mixes.  Choose: Unsalted snack items. Low-sodium cereals, oats, puffed wheat and rice, shredded wheat. English muffins and bread. Pasta. Meats  Avoid:  Salted, canned, smoked, spiced, pickled meats, including fish and poultry. Bacon, ham, sausage, cold cuts, hot dogs, anchovies.  Choose: Low-sodium canned tuna and salmon. Fresh or frozen meat, poultry, and fish. Dairy  Avoid: Processed cheese and spreads. Cottage cheese. Buttermilk and condensed milk. Regular cheese.  Choose:  Milk. Low-sodium cottage cheese. Yogurt. Sour cream. Low-sodium cheese. Fruits and  Vegetables  Avoid:  Regular canned vegetables. Regular canned tomato sauce and paste. Frozen vegetables in sauces. Olives. Rosita Fire. Relishes. Sauerkraut.  Choose:  Low-sodium canned vegetables. Low-sodium tomato sauce and paste. Frozen or fresh vegetables. Fresh and frozen fruit. Condiments  Avoid:  Canned and packaged gravies. Worcestershire sauce. Tartar sauce. Barbecue sauce. Soy sauce. Steak sauce. Ketchup. Onion, garlic, and table salt. Meat flavorings and tenderizers.  Choose:  Fresh and dried herbs and spices. Low-sodium varieties of mustard and ketchup. Lemon juice. Tabasco sauce. Horseradish. Document Released: 12/10/2010 Document Revised: 01/30/2012 Document Reviewed: 12/10/2010 [facility] Patient Information 2014 [place], [place].

## 2013-05-02 ENCOUNTER — Encounter: Payer: Self-pay | Admitting: *Deleted

## 2013-05-08 ENCOUNTER — Ambulatory Visit (INDEPENDENT_AMBULATORY_CARE_PROVIDER_SITE_OTHER): Payer: Medicaid Other | Admitting: Cardiovascular Disease

## 2013-05-08 ENCOUNTER — Encounter: Payer: Self-pay | Admitting: Cardiovascular Disease

## 2013-05-08 VITALS — BP 128/85 | HR 62 | Ht 68.0 in | Wt >= 6400 oz

## 2013-05-08 DIAGNOSIS — I4891 Unspecified atrial fibrillation: Secondary | ICD-10-CM

## 2013-05-08 DIAGNOSIS — I5022 Chronic systolic (congestive) heart failure: Secondary | ICD-10-CM

## 2013-05-08 DIAGNOSIS — I10 Essential (primary) hypertension: Secondary | ICD-10-CM

## 2013-05-08 DIAGNOSIS — I509 Heart failure, unspecified: Secondary | ICD-10-CM | POA: Insufficient documentation

## 2013-05-08 DIAGNOSIS — Z7901 Long term (current) use of anticoagulants: Secondary | ICD-10-CM

## 2013-05-08 NOTE — Assessment & Plan Note (Signed)
Well controlled.  Continue current medications and low sodium Dash type diet.    

## 2013-05-08 NOTE — Assessment & Plan Note (Signed)
Imprvoed with compliance of ACE and HCTZ in addtion to lasix  Weight down 9 lbs.  F/U with new [place] doctor 2-3 months

## 2013-05-08 NOTE — Patient Instructions (Signed)
Your physician recommends that you schedule a follow-up appointment in:  

## 2013-05-08 NOTE — Assessment & Plan Note (Signed)
Good rate control and anticoagulatiion

## 2013-05-08 NOTE — Progress Notes (Signed)
Patient ID: Jeremy Rush, male   DOB: 01-Dec-1950, 62 y.o.   MRN: [id number] Mr. Furry is a morbidly obese 62 y/o patient of Dr. Dietrich Pates we are following for systolic dysfunction, atrial fibrillation and hypertension. He comes today dyspnec with LEE. He has not been taking lisinopril HCTZ as directed. He is under a lot of stress with his mother being very ill. He is not compliant with salt restriction either. Most recent echocardiogram was completed in February of this year concerning LVEF of "near normal" right ventricular size is normal and systolic function was mildly to moderately reduced, was no comment on PA pressure.  Since seeing PA last week has lost 9 lbs and is compliant with his meds now   ROS: Denies fever, malais, weight loss, blurry vision, decreased visual acuity, cough, sputum, SOB, hemoptysis, pleuritic pain, palpitaitons, heartburn, abdominal pain, melena, lower extremity edema, claudication, or rash.  All other systems reviewed and negative  General: Affect appropriate Obese male HEENT: normal Neck supple with no adenopathy JVP normal no bruits no thyromegaly Lungs clear with no wheezing and good diaphragmatic motion Heart:  S1/S2 no murmur, no rub, gallop or click PMI normal Abdomen: benighn, BS positve, no tenderness, no AAA no bruit.  No HSM or HJR Distal pulses intact with no bruits Plus two bilateral edema cchronic dependant stasis Neuro non-focal Skin warm and dry No muscular weakness   Current Outpatient Prescriptions  Medication Sig Dispense Refill  . acetaminophen (TYLENOL) 500 MG tablet Take 500 mg by mouth every 6 (six) hours as needed for pain.      Marland Kitchen diltiazem (CARDIZEM LA) 240 MG 24 hr tablet Take 1 tablet (240 mg total) by mouth daily.  30 tablet  5  . furosemide (LASIX) 20 MG tablet Take 1 tablet (20 mg total) by mouth daily.  30 tablet  6  . ibuprofen (ADVIL,MOTRIN) 800 MG tablet Take 800 mg by mouth every 8 (eight) hours as needed for pain  (headache).      . lisinopril-hydrochlorothiazide (PRINZIDE,ZESTORETIC) 10-12.5 MG per tablet Take 1 tablet by mouth daily.  30 tablet  0  . loratadine (CLARITIN) 10 MG tablet Take 1 tablet (10 mg total) by mouth daily.  30 tablet  6  . Rivaroxaban (XARELTO) 20 MG TABS Take 1 tablet (20 mg total) by mouth daily.  30 tablet  6   No current facility-administered medications for this visit.    Allergies  Codeine and Morphine and related  Electrocardiogram:  Assessment and Plan

## 2013-05-08 NOTE — Assessment & Plan Note (Signed)
F/U coumadin clinic High risk for DVT and chronic afib No bleeding issues

## 2013-06-19 ENCOUNTER — Encounter (INDEPENDENT_AMBULATORY_CARE_PROVIDER_SITE_OTHER): Payer: Self-pay | Admitting: Surgery

## 2013-06-19 ENCOUNTER — Ambulatory Visit (INDEPENDENT_AMBULATORY_CARE_PROVIDER_SITE_OTHER): Payer: Medicaid Other | Admitting: Surgery

## 2013-06-19 VITALS — BP 168/100 | HR 82 | Resp 20 | Ht 68.0 in | Wt >= 6400 oz

## 2013-06-19 DIAGNOSIS — T798XXD Other early complications of trauma, subsequent encounter: Secondary | ICD-10-CM

## 2013-06-19 DIAGNOSIS — T8189XA Other complications of procedures, not elsewhere classified, initial encounter: Secondary | ICD-10-CM

## 2013-06-19 NOTE — Progress Notes (Signed)
ASSESSMENT AND PLAN: 1.  Chronic abdominal wound/draining sinus  Original onlay mesh by Dr. Luan Moore 1999.  I debrided in Sept 2009.  Has draining chronic sinus that connects to the mesh.  The wound is stable.   Return to the office in 4 months.  2. Morbid obesity.  In or around 400 pounds.  He talks about losing weight, but has not done much about this. 3.  Systolic dysfunction and atrial fibrillation - sees Dr. Elvera Lennox. Dietrich Pates. 4.  HTN  5.  We talked about his mother who is having her issues. His mother is now in a nursing facility beside [facility].  HISTORY OF PRESENT ILLNESS: Chief Complaint  Patient presents with  . Follow-up    reck wound/ abd abs   Jeremy Rush is a 62 y.o. (DOB: 1951/07/14)  white male who is a patient of [facility] and comes to me today for draining abdominal wall sinus.  He is doing okay.  Some days he says it drains and some days it does not.  He was in the hospital at [facility] a couple of months ago for his heart, but he cannot give the specifics.  He does not know his diagnosis or the doctors who treated him.  History of wound: The patient had an onlay mesh placed by Dr. Francina Ames in 1999. He presented to the doctor of the week [facility] in September 2009 with an abdominal wall abscess. I did an incision and drainage of abdominal wall abscess. 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  His morbid obesity limits the care of this wound. I had debrided this wound in the office every 3 or 4 months. We have discussed about going back to the operating room for further debridement, but at this time he was to continue the office visit debridements.  PHYSICAL EXAM: BP 168/100  Pulse 82  Resp 20  Ht 5\' 8"  (1.727 m)  Wt 407 lb 3.2 oz (184.705 kg)  BMI 61.93 kg/m2  Abdomen:  Obese.  Draining sinus wound.  About the same.  I again probed it with a hemostat.  DATA REVIEWED: None  Ovidio Kin, MD,  [facility] Pager: [phone] Office phone:  [phone]

## 2013-07-16 ENCOUNTER — Ambulatory Visit (INDEPENDENT_AMBULATORY_CARE_PROVIDER_SITE_OTHER): Payer: Medicaid Other | Admitting: Cardiovascular Disease

## 2013-07-16 ENCOUNTER — Encounter: Payer: Self-pay | Admitting: Cardiovascular Disease

## 2013-07-16 VITALS — BP 127/86 | HR 95 | Ht 68.0 in | Wt >= 6400 oz

## 2013-07-16 DIAGNOSIS — I4891 Unspecified atrial fibrillation: Secondary | ICD-10-CM

## 2013-07-16 DIAGNOSIS — I509 Heart failure, unspecified: Secondary | ICD-10-CM

## 2013-07-16 DIAGNOSIS — Z7901 Long term (current) use of anticoagulants: Secondary | ICD-10-CM

## 2013-07-16 DIAGNOSIS — I10 Essential (primary) hypertension: Secondary | ICD-10-CM

## 2013-07-16 DIAGNOSIS — I5022 Chronic systolic (congestive) heart failure: Secondary | ICD-10-CM

## 2013-07-16 NOTE — Progress Notes (Signed)
Patient ID: Jeremy Rush, male   DOB: 1951/08/04, 62 y.o.   MRN: [id number]    SUBJECTIVE: Jeremy Rush has a h/o RV systolic dysfunction, atrial fibrillation, morbid obesity, and HTN. He denies chest pain and shortness of breath. When he walks around, his legs get a little swollen but this is relieved with leg elevation. He also denies palpitations, lightheadedness, and syncope. He has stress related to his mother being in a nursing home.  He lives with his sister.    Filed Vitals:   07/16/13 1310  BP: 127/86  Pulse: 95  Height: 5\' 8"  (1.727 m)  Weight: 407 lb 8 oz (184.841 kg)  SpO2: 96%   @IOBRIEF @  PHYSICAL EXAM General: NAD Neck: No JVD, no thyromegaly or thyroid nodule.  Lungs: Clear to auscultation bilaterally with normal respiratory effort. CV: Nondisplaced PMI.  Heart irregular rhythm, rate-controlled, S1/S2, no S3/S4, no murmur.  No peripheral edema.  No carotid bruit.  Normal pedal pulses.  Abdomen: Soft, nontender, no hepatosplenomegaly, no distention.  Neurologic: Alert and oriented x 3.  Psych: Normal affect. Extremities: No clubbing or cyanosis. Stasis dermatitis.    LABS: Basic Metabolic Panel: No results found for this basename: NA, K, CL, CO2, GLUCOSE, BUN, CREATININE, CALCIUM, MG, PHOS,  in the last 72 hours Liver Function Tests: No results found for this basename: AST, ALT, ALKPHOS, BILITOT, PROT, ALBUMIN,  in the last 72 hours No results found for this basename: LIPASE, AMYLASE,  in the last 72 hours CBC: No results found for this basename: WBC, NEUTROABS, HGB, HCT, MCV, PLT,  in the last 72 hours Cardiac Enzymes: No results found for this basename: CKTOTAL, CKMB, CKMBINDEX, TROPONINI,  in the last 72 hours BNP: No components found with this basename: POCBNP,  D-Dimer: No results found for this basename: DDIMER,  in the last 72 hours Hemoglobin A1C: No results found for this basename: HGBA1C,  in the last 72 hours Fasting Lipid Panel: No results  found for this basename: CHOL, HDL, LDLCALC, TRIG, CHOLHDL, LDLDIRECT,  in the last 72 hours Thyroid Function Tests: No results found for this basename: TSH, T4TOTAL, FREET3, T3FREE, THYROIDAB,  in the last 72 hours Anemia Panel: No results found for this basename: VITAMINB12, FOLATE, FERRITIN, TIBC, IRON, RETICCTPCT,  in the last 72 hours  Echo (Feb 2014): Left ventricle: The cavity size was mildly dilated. There was mild concentric hypertrophy. Systolic function was variable based upon the R-R interval in AF. At the slowest heart rates, the estimated ejection fraction was normal or near normal without segmental wall motion abnormalities. - Aortic valve: Mildly to moderately calcified annulus. Mildly thickened, mildly calcified leaflets. - Ascending aorta: The ascending aorta was mildly to moderately dilated. - Mitral valve: Calcified annulus. - Left atrium: The atrium was mildly to moderately dilated. - Right ventricle: The cavity size was normal. Wall thickness was mildly increased. Systolic function was mildly to moderately reduced.     ASSESSMENT AND PLAN: 1. Atrial fibrillation: rate controlled on Diltiazem, and on Xarelto for anticoagulation. Probably related to obesity and sleep apnea, but he is not interested in a sleep study. 2. HTN: well controlled on current therapy. 3. Leg swelling: relieved with elevation and Lasix.   Prentice Docker, M.D., F.A.C.C.

## 2013-07-16 NOTE — Patient Instructions (Addendum)
Your physician recommends that you schedule a follow-up appointment in: 6 MONTHS You will receive a reminder letter two months in advance reminding you to call and schedule your appointment. If you don't receive this letter, please contact our office.  Your physician recommends that you continue on your current medications as directed. Please refer to the Current Medication list given to you today.   

## 2013-08-27 ENCOUNTER — Telehealth: Payer: Self-pay | Admitting: *Deleted

## 2013-08-27 NOTE — Telephone Encounter (Signed)
Pt has questions about xarelto he is on

## 2013-08-28 NOTE — Telephone Encounter (Signed)
Saw warning ad on TV last night about increase risk of bleeding.  Questions answered and rational explained.  Pt has not had any symptoms.  Told pt to let us know if he develops any blood in urine, stool or nose bleeds.  He verbalized understanding.

## 2013-09-02 ENCOUNTER — Other Ambulatory Visit: Payer: Self-pay | Admitting: Adult Health

## 2013-09-09 ENCOUNTER — Other Ambulatory Visit: Payer: Self-pay | Admitting: Adult Health

## 2013-09-18 ENCOUNTER — Telehealth: Payer: Self-pay | Admitting: Adult Health

## 2013-09-18 MED ORDER — DILTIAZEM HCL ER COATED BEADS 240 MG PO TB24: 240.0000 mg | ORAL_TABLET | Freq: Every day | ORAL | Status: DC

## 2013-09-18 NOTE — Telephone Encounter (Signed)
Medication sent via escribe.  

## 2013-09-18 NOTE — Telephone Encounter (Signed)
Received fax refill request  Rx # [id number] Medication:  Matzim LA 240mg  tablet Qty 30 Sig:  Take one tablet by mouth daily  Physician:  Lyman Bishop

## 2013-10-16 ENCOUNTER — Telehealth: Payer: Self-pay | Admitting: Adult Health

## 2013-10-16 ENCOUNTER — Telehealth: Payer: Self-pay | Admitting: Cardiovascular Disease

## 2013-10-16 MED ORDER — DILTIAZEM HCL ER COATED BEADS 240 MG PO TB24: 240.0000 mg | ORAL_TABLET | Freq: Every day | ORAL | Status: DC

## 2013-10-16 NOTE — Telephone Encounter (Signed)
Pt [facility] [place]. Pt states pharmacy has faxed [place] twice for pre-authorization

## 2013-10-16 NOTE — Telephone Encounter (Signed)
Received fax refill request  Rx # [id number] Medication:  Matzim LA 240 mg tablet Qty 30 Sig:  Take one tablet by mouth daily  Physician:  Lyman Bishop

## 2013-10-16 NOTE — Telephone Encounter (Signed)
Already refilled on 10/29 with 5 refills.

## 2013-11-13 ENCOUNTER — Encounter (INDEPENDENT_AMBULATORY_CARE_PROVIDER_SITE_OTHER): Payer: Self-pay | Admitting: Surgery

## 2013-11-13 ENCOUNTER — Ambulatory Visit (INDEPENDENT_AMBULATORY_CARE_PROVIDER_SITE_OTHER): Payer: Medicaid Other | Admitting: Surgery

## 2013-11-13 VITALS — BP 130/80 | HR 76 | Temp 98.8°F | Resp 15 | Ht 68.0 in | Wt >= 6400 oz

## 2013-11-13 DIAGNOSIS — S31109A Unspecified open wound of abdominal wall, unspecified quadrant without penetration into peritoneal cavity, initial encounter: Secondary | ICD-10-CM

## 2013-11-13 DIAGNOSIS — T798XXD Other early complications of trauma, subsequent encounter: Secondary | ICD-10-CM

## 2013-11-13 NOTE — Progress Notes (Signed)
Name:  Jeremy Rush DOB: [birth year] MRN:  [id number]    ASSESSMENT AND PLAN: 1.  Chronic abdominal wound/draining sinus  Original onlay mesh by Dr. Luan Moore 1999.  I debrided the area in Sept 2009.  Has draining chronic sinus that connects to the mesh.  The wound is stable.   Return to the office in 4 months.  2. Morbid obesity.  In or around 400 pounds.  He talks about losing weight, but has not done much about this. 3.  Systolic dysfunction and atrial fibrillation - has seen Dr. Elvera Lennox. Dietrich Pates. 4.  HTN  5.  We talked about his mother who is having her issues. His mother is now in a nursing facility beside [facility].  She cannot walk now.  HISTORY OF PRESENT ILLNESS: Chief Complaint  Patient presents with  . Follow-up    LTFu 4 mo abd wound check   Jeremy Rush is a 62 y.o. (DOB: January 01, 1951)  white male who is a patient of [facility] and comes to me today for draining abdominal wall sinus.  The area is about the same.  It drains some times and then tries to close.  He has had no fever or other problems.  History of wound: The patient had an onlay mesh placed by Dr. Francina Ames in 1999. He presented to the [facility] the week [facility] in September 2009 with an abdominal wall abscess. I did an incision and drainage of abdominal wall abscess. 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  His morbid obesity limits the care of this wound. I had debrided this wound in the office every 3 or 4 months. We have discussed about going back to the operating room for further debridement, but at this time he was to continue the office visit debridements.  PHYSICAL EXAM: BP 130/80  Pulse 76  Temp(Src) 98.8 F (37.1 C) (Temporal)  Resp 15  Ht 5\' 8"  (1.727 m)  Wt 409 lb (185.521 kg)  BMI 62.20 kg/m2  Abdomen:  Obese.  Draining sinus wound.  About the same.  I removed a little bit more of the mesh.  The cavity is somewhat deeper than I  remember.  DATA REVIEWED: None  Ovidio Kin, MD, [facility] Pager: [phone] Office phone:  [phone]

## 2013-12-25 ENCOUNTER — Other Ambulatory Visit: Payer: Self-pay | Admitting: Adult Health

## 2014-02-03 ENCOUNTER — Other Ambulatory Visit: Payer: Self-pay | Admitting: Cardiology

## 2014-02-03 MED ORDER — RIVAROXABAN 20 MG PO TABS: ORAL_TABLET | ORAL | Status: DC

## 2014-02-27 ENCOUNTER — Other Ambulatory Visit: Payer: Self-pay | Admitting: *Deleted

## 2014-02-27 MED ORDER — FUROSEMIDE 20 MG PO TABS: 20.0000 mg | ORAL_TABLET | Freq: Every day | ORAL | Status: DC

## 2014-04-13 ENCOUNTER — Other Ambulatory Visit: Payer: Self-pay | Admitting: Adult Health

## 2014-04-29 ENCOUNTER — Telehealth: Payer: Self-pay | Admitting: *Deleted

## 2014-04-29 MED ORDER — FUROSEMIDE 20 MG PO TABS: 20.0000 mg | ORAL_TABLET | Freq: Every day | ORAL | Status: DC

## 2014-04-29 NOTE — Telephone Encounter (Signed)
Medication sent via escribe.  

## 2014-04-29 NOTE — Telephone Encounter (Signed)
[facility] [address] Pt states they have been faxing [place].

## 2014-05-26 ENCOUNTER — Telehealth: Payer: Self-pay | Admitting: Cardiovascular Disease

## 2014-05-26 MED ORDER — LISINOPRIL-HYDROCHLOROTHIAZIDE 10-12.5 MG PO TABS: 1.0000 | ORAL_TABLET | Freq: Every day | ORAL | Status: DC

## 2014-05-26 MED ORDER — FUROSEMIDE 20 MG PO TABS: 20.0000 mg | ORAL_TABLET | Freq: Every day | ORAL | Status: DC

## 2014-05-26 NOTE — Telephone Encounter (Signed)
Refill request complete 

## 2014-06-05 ENCOUNTER — Ambulatory Visit (INDEPENDENT_AMBULATORY_CARE_PROVIDER_SITE_OTHER): Payer: Medicaid Other | Admitting: Surgery

## 2014-06-05 ENCOUNTER — Encounter (INDEPENDENT_AMBULATORY_CARE_PROVIDER_SITE_OTHER): Payer: Self-pay | Admitting: Surgery

## 2014-06-05 VITALS — BP 130/78 | HR 65 | Resp 14 | Ht 68.0 in | Wt 395.2 lb

## 2014-06-05 DIAGNOSIS — T148XXA Other injury of unspecified body region, initial encounter: Secondary | ICD-10-CM

## 2014-06-05 DIAGNOSIS — L089 Local infection of the skin and subcutaneous tissue, unspecified: Secondary | ICD-10-CM

## 2014-06-05 DIAGNOSIS — Z5189 Encounter for other specified aftercare: Secondary | ICD-10-CM

## 2014-06-05 DIAGNOSIS — T798XXD Other early complications of trauma, subsequent encounter: Secondary | ICD-10-CM

## 2014-06-05 NOTE — Progress Notes (Signed)
Name:  Jeremy Rush DOB: 06/27/1951 MRN:  [id number]   ASSESSMENT AND PLAN: 1.  Chronic abdominal wound/draining sinus  Original onlay mesh by Dr. Luan MooreP. Young 1999.  I debrided the area in Sept 2009.  Has draining chronic sinus that connects to the mesh.  The wound is stable.   Return to the office in 4 months.  2. Morbid obesity.  In or around 400 pounds.  He talks about losing weight, but has not done much about this.  His weight is actually down 14 pounds from Dec 2014. 3.  Systolic dysfunction and atrial fibrillation - has seen Dr. Elvera Lennox. Dietrich Patesothbart. 4.  HTN 5.  Right knee 6.  Anticoagulation - on Xarelto  HISTORY OF PRESENT ILLNESS: Chief Complaint  Patient presents with  . Wound Infection   Jeremy RaringDavid B Rush is a 63 y.o. (DOB: 09/21/1951)  white male who is a patient of No primary provider on file. (he uses his cardiologist as his PCP - the last one I can find that he saw was Dr. Melburn PopperNasher in June 2004) and comes to me today for draining abdominal wall sinus. He comes by himself. The drainage is unchanged.  But it looks better and the wound looks better.  If it is ever going to heal, I'll need to debride this.  But I will not attempt this until he has lost below 350 pounds.  History of wound: The patient had an onlay mesh placed by Dr. Francina AmesPeter Young in 1999. He presented to the [facility] the [facility] in September 2009 with an abdominal wall abscess. I did an incision and drainage of abdominal wall abscess. 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  His morbid obesity limits the care of this wound. I had debrided this wound in the office every 3 or 4 months. We have discussed about going back to the operating room for further debridement, but at this time he was to continue the office visit debridements.  SOCIAL HISTORY: His mother died March 2015.  PHYSICAL EXAM: BP 130/78  Pulse 65  Resp 14  Ht 5\' 8"  (1.727 m)  Wt 395 lb 3.2 oz (179.262 kg)   BMI 60.10 kg/m2  Abdomen:  Obese.  Draining sinus wound.  About the same.  I removed a little bit more of the mesh.  The cavity is somewhat deeper than I remember.  DATA REVIEWED: None  Ovidio Kinavid Laverna Dossett, MD, [facility] Pager: [phone] Office phone:  [phone]

## 2014-06-30 ENCOUNTER — Ambulatory Visit (INDEPENDENT_AMBULATORY_CARE_PROVIDER_SITE_OTHER): Payer: Medicaid Other | Admitting: Cardiovascular Disease

## 2014-06-30 ENCOUNTER — Encounter: Payer: Self-pay | Admitting: Cardiovascular Disease

## 2014-06-30 VITALS — BP 132/80 | HR 80 | Ht 68.0 in | Wt 395.0 lb

## 2014-06-30 DIAGNOSIS — I482 Chronic atrial fibrillation, unspecified: Secondary | ICD-10-CM

## 2014-06-30 DIAGNOSIS — I10 Essential (primary) hypertension: Secondary | ICD-10-CM

## 2014-06-30 DIAGNOSIS — I77819 Aortic ectasia, unspecified site: Secondary | ICD-10-CM

## 2014-06-30 DIAGNOSIS — R609 Edema, unspecified: Secondary | ICD-10-CM

## 2014-06-30 DIAGNOSIS — I4891 Unspecified atrial fibrillation: Secondary | ICD-10-CM

## 2014-06-30 DIAGNOSIS — R600 Localized edema: Secondary | ICD-10-CM

## 2014-06-30 MED ORDER — DILTIAZEM HCL ER COATED BEADS 240 MG PO TB24: ORAL_TABLET | ORAL | Status: DC

## 2014-06-30 MED ORDER — LISINOPRIL-HYDROCHLOROTHIAZIDE 10-12.5 MG PO TABS: 1.0000 | ORAL_TABLET | Freq: Every day | ORAL | Status: DC

## 2014-06-30 MED ORDER — FUROSEMIDE 20 MG PO TABS: 20.0000 mg | ORAL_TABLET | Freq: Every day | ORAL | Status: DC

## 2014-06-30 MED ORDER — RIVAROXABAN 20 MG PO TABS: ORAL_TABLET | ORAL | Status: DC

## 2014-06-30 NOTE — Addendum Note (Signed)
Addended by: Tenna DelaineBARKER, Malky Rudzinski T on: 06/30/2014 02:34 PM   Modules accepted: Orders

## 2014-06-30 NOTE — Patient Instructions (Signed)
Your physician recommends that you schedule a follow-up appointment in: 1 year with Dr Reggy EyeKoneswaran You will receive a reminder letter two months in advance reminding you to call and schedule your appointment. If you don't receive this letter, please contact our office.  Non-Cardiac CT Angiography (CTA), is a special type of CT scan that uses a computer to produce multi-dimensional views of major blood vessels throughout the body. In CT angiography, a contrast material is injected through an IV to help visualize the blood vessels  Your physician recommends that you continue on your current medications as directed. Please refer to the Current Medication list given to you today.

## 2014-06-30 NOTE — Progress Notes (Signed)
Patient ID: Jeremy Rush, male   DOB: 1951/09/09, 63 y.o.   MRN: [id number]      SUBJECTIVE: Jeremy Rush has a history of atrial fibrillation, morbid obesity, aortic root enlargement, and essential HTN. Echocardiography, with detail results noted below, demonstrated essentially normal LV systolic function with reduced RV systolic function and mild to moderate aortic root dilatation. He would like to start walking more and would love to start golfing again. He denies chest pain, palpitations and dizziness. His chronic leg swelling is controlled with Lasix 20 mg daily. He now weighs 395 pounds and he is happy that he is under 400 pounds.  ECG performed in the office today demonstrates atrial fibrillation with incomplete right bundle branch block, heart rate 90 beats per minute.  Review of Systems: As per "subjective", otherwise negative.  Allergies  Allergen Reactions  . Codeine Other (See Comments)    Mood swing  . Morphine And Related Other (See Comments)    Mood swing    Current Outpatient Prescriptions  Medication Sig Dispense Refill  . acetaminophen (TYLENOL) 500 MG tablet Take 500 mg by mouth every 6 (six) hours as needed for pain.      Marland Kitchen CARDIZEM LA 240 MG 24 hr tablet TAKE 1 TABLET BY MOUTH EVERY DAY  30 tablet  2  . furosemide (LASIX) 20 MG tablet Take 1 tablet (20 mg total) by mouth daily.  30 tablet  1  . ibuprofen (ADVIL,MOTRIN) 800 MG tablet Take 800 mg by mouth every 8 (eight) hours as needed for pain (headache).      . lisinopril-hydrochlorothiazide (PRINZIDE,ZESTORETIC) 10-12.5 MG per tablet Take 1 tablet by mouth daily.  30 tablet  1  . loratadine (CLARITIN) 10 MG tablet Take 1 tablet (10 mg total) by mouth daily.  30 tablet  6  . Rivaroxaban (XARELTO) 20 MG TABS tablet TAKE 1 TABLET (20 MG TOTAL) BY MOUTH DAILY.  30 tablet  4   No current facility-administered medications for this visit.    Past Medical History  Diagnosis Date  . Morbid obesity   . Hypertension     . DJD (degenerative joint disease)     Knees  . Atrial fibrillation 2014    Initial diagnosis in 12/2012  . Hyperlipidemia     Past Surgical History  Procedure Laterality Date  . Ventral hernia repair  1990s-2000    X4  . Abdominal debridement  2009    Dr. Ovidio Kin, infected mesh/abscess  . Wrist surgery      Right    History   Social History  . Marital Status: Single    Spouse Name: N/A    Number of Children: N/A  . Years of Education: N/A   Occupational History  . Disabled    Social History Main Topics  . Smoking status: Never Smoker   . Smokeless tobacco: Never Used  . Alcohol Use: No  . Drug Use: No  . Sexual Activity: Not on file   Other Topics Concern  . Not on file   Social History Narrative   Lives alone with family nearby     [place] Vitals:   06/30/14 1341  BP: 132/80  Pulse: 80  Height: 5\' 8"  (1.727 m)  Weight: 395 lb (179.171 kg)    PHYSICAL EXAM General: NAD, morbidly obese Neck: No JVD, no thyromegaly. Lungs: Clear to auscultation bilaterally with normal respiratory effort. CV: Nondisplaced PMI.  Irregular rhythm, normal S1/S2, no S3, no murmur. No pretibial or periankle edema.  No carotid bruit. Chronic stasis dermatitis. Abdomen: Soft, obese.  Neurologic: Alert and oriented x 3.  Psych: Normal affect. Extremities: No clubbing or cyanosis.   ECG: reviewed and available in electronic records.  Echo (01-17-13): - Left ventricle: The cavity size was mildly dilated. There was mild concentric hypertrophy. Systolic function was variable based upon the R-R interval in AF. At the slowest heart rates, the estimated ejection fraction wasnormal or near normal without segmental wall motion abnormalities. - Aortic valve: Mildly to moderately calcified annulus. Mildly thickened, mildly calcified leaflets. - Ascending aorta: The ascending aorta was mildly to moderately dilated. - Mitral valve: Calcified annulus. - Left atrium: The atrium was  mildly to moderately dilated. - Right ventricle: The cavity size was normal. Wall thickness was mildly increased. Systolic function was mildly to moderately reduced. - Atrial septum: No defect or patent foramen ovale was identified.    ASSESSMENT AND PLAN: 1. Atrial fibrillation: Rate controlled on Diltiazem, and on Xarelto for anticoagulation. Likely related to obesity and sleep apnea, but he previously refused a sleep study.  2. HTN: Well controlled on current therapy which includes Prinzide 10-12.5 mg daily. 3. Aortic root enlargement: Will obtain CT angiography to monitor progression. 4. Chronic leg swelling: Controlled on Lasix 20 mg daily.  Dispo: f/u 1 year.  Jeremy Rush, M.D., F.A.C.C.

## 2014-11-10 ENCOUNTER — Emergency Department (HOSPITAL_COMMUNITY)
Admission: EM | Admit: 2014-11-10 | Discharge: 2014-11-10 | Disposition: A | Discharge status: Home / Self Care | Payer: Medicaid Other | Attending: Emergency Medicine | Admitting: Emergency Medicine

## 2014-11-10 ENCOUNTER — Emergency Department (HOSPITAL_COMMUNITY): Payer: Medicaid Other

## 2014-11-10 ENCOUNTER — Encounter (HOSPITAL_COMMUNITY): Payer: Self-pay | Admitting: Cardiology

## 2014-11-10 DIAGNOSIS — R0602 Shortness of breath: Secondary | ICD-10-CM

## 2014-11-10 DIAGNOSIS — R110 Nausea: Secondary | ICD-10-CM | POA: Diagnosis not present

## 2014-11-10 DIAGNOSIS — R6883 Chills (without fever): Secondary | ICD-10-CM | POA: Insufficient documentation

## 2014-11-10 DIAGNOSIS — R42 Dizziness and giddiness: Secondary | ICD-10-CM | POA: Insufficient documentation

## 2014-11-10 DIAGNOSIS — M170 Bilateral primary osteoarthritis of knee: Secondary | ICD-10-CM | POA: Diagnosis not present

## 2014-11-10 DIAGNOSIS — M549 Dorsalgia, unspecified: Secondary | ICD-10-CM | POA: Insufficient documentation

## 2014-11-10 DIAGNOSIS — Z7901 Long term (current) use of anticoagulants: Secondary | ICD-10-CM | POA: Insufficient documentation

## 2014-11-10 DIAGNOSIS — I4891 Unspecified atrial fibrillation: Secondary | ICD-10-CM | POA: Diagnosis not present

## 2014-11-10 DIAGNOSIS — I10 Essential (primary) hypertension: Secondary | ICD-10-CM | POA: Diagnosis not present

## 2014-11-10 DIAGNOSIS — Z79899 Other long term (current) drug therapy: Secondary | ICD-10-CM | POA: Diagnosis not present

## 2014-11-10 LAB — BASIC METABOLIC PANEL
Anion gap: 14 (ref 5–15)
BUN: 33 mg/dL — ABNORMAL HIGH (ref 6–23)
CHLORIDE: 100 meq/L (ref 96–112)
CO2: 25 mEq/L (ref 19–32)
CREATININE: 1.73 mg/dL — AB (ref 0.50–1.35)
Calcium: 8.7 mg/dL (ref 8.4–10.5)
GFR calc non Af Amer: 41 mL/min — ABNORMAL LOW (ref >90)
GFR, EST AFRICAN AMERICAN: 47 mL/min — AB (ref >90)
GLUCOSE: 121 mg/dL — AB (ref 70–99)
POTASSIUM: 4.2 meq/L (ref 3.7–5.3)
Sodium: 139 mEq/L (ref 137–147)

## 2014-11-10 LAB — CBC WITH DIFFERENTIAL/PLATELET
BASOS PCT: 0 % (ref 0–1)
Basophils Absolute: 0 10*3/uL (ref 0.0–0.1)
EOS ABS: 0.1 10*3/uL (ref 0.0–0.7)
Eosinophils Relative: 1 % (ref 0–5)
HEMATOCRIT: 44.5 % (ref 39.0–52.0)
Hemoglobin: 14.3 g/dL (ref 13.0–17.0)
Lymphocytes Relative: 3 % — ABNORMAL LOW (ref 12–46)
Lymphs Abs: 0.3 10*3/uL — ABNORMAL LOW (ref 0.7–4.0)
MCH: 28 pg (ref 26.0–34.0)
MCHC: 32.1 g/dL (ref 30.0–36.0)
MCV: 87.3 fL (ref 78.0–100.0)
MONO ABS: 0.1 10*3/uL (ref 0.1–1.0)
Monocytes Relative: 2 % — ABNORMAL LOW (ref 3–12)
NEUTROS ABS: 9 10*3/uL — AB (ref 1.7–7.7)
NEUTROS PCT: 94 % — AB (ref 43–77)
Platelets: 170 10*3/uL (ref 150–400)
RBC: 5.1 MIL/uL (ref 4.22–5.81)
RDW: 14 % (ref 11.5–15.5)
WBC: 9.6 10*3/uL (ref 4.0–10.5)

## 2014-11-10 LAB — TROPONIN I: Troponin I: <0.3 ng/mL (ref <0.30)

## 2014-11-10 MED ORDER — SODIUM CHLORIDE 0.9 % IV SOLN
INTRAVENOUS | Status: DC
  Administered 2014-11-10: 100 mL/h via INTRAVENOUS

## 2014-11-10 MED ORDER — SODIUM CHLORIDE 0.9 % IV BOLUS (SEPSIS)
500.0000 mL | Freq: Once | INTRAVENOUS | Status: AC
  Administered 2014-11-10: 500 mL via INTRAVENOUS

## 2014-11-10 MED ORDER — DILTIAZEM HCL 25 MG/5ML IV SOLN
10.0000 mg | Freq: Once | INTRAVENOUS | Status: AC
  Administered 2014-11-10: 13:00:00 via INTRAVENOUS
  Filled 2014-11-10: qty 5

## 2014-11-10 NOTE — ED Notes (Signed)
Called EMS because he feels sick.  When placed on the monitor he was in afib with rvr 180's.  B/p 125/79.  sats ok.  Denies any pain.  C/o nausea.  Pt still in a fib with rate around 100. Pt c/o sob also.

## 2014-11-10 NOTE — ED Notes (Signed)
Pt walked to bathroom and back to room , pt in eating cracker 98% on room air HR 91

## 2014-11-10 NOTE — ED Provider Notes (Addendum)
CSN: [id number]     Arrival date & time 11/10/14  1107 History  This chart was scribed for Vanetta MuldersScott Peityn Payton, MD by Abel PrestoKara Demonbreun, ED Scribe. This patient was seen in room APA14/APA14 and the patient's care was started at 12:36 PM.    Chief Complaint  Patient presents with  . Shortness of Breath  . Nausea    Patient is a 63 y.o. male presenting with shortness of breath. The history is provided by the patient. No language interpreter was used.  Shortness of Breath Associated symptoms: no abdominal pain, no chest pain, no cough, no fever, no headaches, no rash, no sore throat and no vomiting    HPI Comments: HPI Comments: Jeremy Rush is a 63 y.o. male with a PMHx of hypertension, atrial fibrillation, and hyperlipidemia brought in by ambulance, who presents to the Emergency Department complaining of SOB with onset PTA. Pt notes associated nausea and chills at onset. Pt's sister called EMS. Per EMS, pt's rate was 180 and BP was 125/79.  Pt has edema and swelling in ankles at baseline.  Pt notes he has been taking his medications regularly. Pt has a hernia on left side of abdomen since 2000 and he notes it occasionally leaks.  Pt lives by himself. Pt denies feeling febrile and chest pain. [facility].     Past Medical History  Diagnosis Date  . Morbid obesity   . Hypertension   . DJD (degenerative joint disease)     Knees  . Atrial fibrillation 2014    Initial diagnosis in 12/2012  . Hyperlipidemia    Past Surgical History  Procedure Laterality Date  . Ventral hernia repair  1990s-2000    X4  . Abdominal debridement  2009    Dr. Ovidio Kinavid Newman, infected mesh/abscess  . Wrist surgery      Right   History reviewed. No pertinent family history. History  Substance Use Topics  . Smoking status: Never Smoker   . Smokeless tobacco: Never Used  . Alcohol Use: No    Review of Systems  Constitutional: Positive for chills. Negative for fever.  HENT:  Negative for rhinorrhea and sore throat.   Eyes: Negative for visual disturbance.  Respiratory: Positive for shortness of breath. Negative for cough.   Cardiovascular: Positive for leg swelling. Negative for chest pain.  Gastrointestinal: Positive for nausea. Negative for vomiting, abdominal pain and diarrhea.  Genitourinary: Negative for dysuria.  Musculoskeletal: Positive for back pain.  Skin: Negative for rash.  Neurological: Positive for light-headedness. Negative for headaches.  Hematological: Bruises/bleeds easily.  Psychiatric/Behavioral: Negative for confusion.      Allergies  Codeine and Morphine and related  Home Medications   Prior to Admission medications   Medication Sig Start Date End Date Taking? Authorizing Provider  acetaminophen (TYLENOL) 500 MG tablet Take 500 mg by mouth every 6 (six) hours as needed for pain.   Yes Historical Provider, MD  diltiazem (CARDIZEM LA) 240 MG 24 hr tablet TAKE 1 TABLET BY MOUTH EVERY DAY Patient taking differently: Take 240 mg by mouth daily. TAKE 1 TABLET BY MOUTH EVERY DAY 06/30/14  Yes Laqueta LindenSuresh A Koneswaran, MD  furosemide (LASIX) 20 MG tablet Take 1 tablet (20 mg total) by mouth daily. 06/30/14  Yes Laqueta LindenSuresh A Koneswaran, MD  lisinopril-hydrochlorothiazide (PRINZIDE,ZESTORETIC) 10-12.5 MG per tablet Take 1 tablet by mouth daily. 06/30/14  Yes Laqueta LindenSuresh A Koneswaran, MD  loratadine (CLARITIN) 10 MG tablet Take 1 tablet (10 mg total) by mouth  daily. Patient taking differently: Take 10 mg by mouth daily as needed.  01/31/13  Yes Jodelle GrossKathryn M Lawrence, NP  rivaroxaban (XARELTO) 20 MG TABS tablet TAKE 1 TABLET (20 MG TOTAL) BY MOUTH DAILY. Patient taking differently: Take 20 mg by mouth daily. TAKE 1 TABLET (20 MG TOTAL) BY MOUTH DAILY. 06/30/14  Yes Laqueta LindenSuresh A Koneswaran, MD   BP 114/91 mmHg  Pulse 93  Temp(Src) 98.4 F (36.9 C) (Oral)  Resp 18  Ht 5\' 8"  (1.727 m)  Wt 395 lb (179.171 kg)  BMI 60.07 kg/m2  SpO2 99% Physical Exam   Constitutional: He is oriented to person, place, and time. He appears well-developed and well-nourished.  HENT:  Head: Normocephalic.  Eyes: Conjunctivae and EOM are normal. Pupils are equal, round, and reactive to light.  Neck: Normal range of motion. Neck supple.  Cardiovascular: Regular rhythm.  Tachycardia present.  Exam reveals no friction rub.   No murmur heard. Pulmonary/Chest: Effort normal and breath sounds normal. No respiratory distress. He has no wheezes. He has no rales.  Abdominal: There is no tenderness.  Musculoskeletal: Normal range of motion. He exhibits edema (in ankles, pitting edema).  Neurological: He is alert and oriented to person, place, and time. No cranial nerve deficit. He exhibits normal muscle tone. Coordination normal.  Skin: Skin is warm and dry.  Well healed LUQ incision Area of erythema approx. 3-5 cm superior and slightly to left of belly button, once opened and healed by scarring.   Psychiatric: He has a normal mood and affect. His behavior is normal.  Nursing note and vitals reviewed.  DIAGNOSTIC STUDIES: Oxygen Saturation is 96% on room air, normal by my interpretation.    COORDINATION OF CARE: 12:45 PM Discussed treatment plan with patient at beside, the patient agrees with the plan and has no further questions at this time.   ED Course  Procedures (including critical care time) Labs Review Labs Reviewed  CBC WITH DIFFERENTIAL - Abnormal; Notable for the following:    Neutrophils Relative % 94 (*)    Neutro Abs 9.0 (*)    Lymphocytes Relative 3 (*)    Lymphs Abs 0.3 (*)    Monocytes Relative 2 (*)    All other components within normal limits  BASIC METABOLIC PANEL - Abnormal; Notable for the following:    Glucose, Bld 121 (*)    BUN 33 (*)    Creatinine, Ser 1.73 (*)    GFR calc non Af Amer 41 (*)    GFR calc Af Amer 47 (*)    All other components within normal limits  TROPONIN I   Results for orders placed or performed during the  hospital encounter of 11/10/14  CBC with Differential  Result Value Ref Range   WBC 9.6 4.0 - 10.5 K/uL   RBC 5.10 4.22 - 5.81 MIL/uL   Hemoglobin 14.3 13.0 - 17.0 g/dL   HCT 40.944.5 81.139.0 - 91.452.0 %   MCV 87.3 78.0 - 100.0 fL   MCH 28.0 26.0 - 34.0 pg   MCHC 32.1 30.0 - 36.0 g/dL   RDW 78.214.0 95.611.5 - 21.315.5 %   Platelets 170 150 - 400 K/uL   Neutrophils Relative % 94 (H) 43 - 77 %   Neutro Abs 9.0 (H) 1.7 - 7.7 K/uL   Lymphocytes Relative 3 (L) 12 - 46 %   Lymphs Abs 0.3 (L) 0.7 - 4.0 K/uL   Monocytes Relative 2 (L) 3 - 12 %   Monocytes Absolute 0.1 0.1 -  1.0 K/uL   Eosinophils Relative 1 0 - 5 %   Eosinophils Absolute 0.1 0.0 - 0.7 K/uL   Basophils Relative 0 0 - 1 %   Basophils Absolute 0.0 0.0 - 0.1 K/uL  Basic metabolic panel  Result Value Ref Range   Sodium 139 137 - 147 mEq/L   Potassium 4.2 3.7 - 5.3 mEq/L   Chloride 100 96 - 112 mEq/L   CO2 25 19 - 32 mEq/L   Glucose, Bld 121 (H) 70 - 99 mg/dL   BUN 33 (H) 6 - 23 mg/dL   Creatinine, Ser 1.61 (H) 0.50 - 1.35 mg/dL   Calcium 8.7 8.4 - 09.6 mg/dL   GFR calc non Af Amer 41 (L) >90 mL/min   GFR calc Af Amer 47 (L) >90 mL/min   Anion gap 14 5 - 15  Troponin I  Result Value Ref Range   Troponin I <0.30 <0.30 ng/mL     Imaging Review No results found.   EKG Interpretation   Date/Time:  Monday November 10 2014 11:23:07 EST Ventricular Rate:  144 PR Interval:  137 QRS Duration: 106 QT Interval:  309 QTC Calculation: 478 R Axis:   112 Text Interpretation:  Sinus tachycardia with irregular rate Borderline low  voltage, extremity leads Probable right ventricular hypertrophy Borderline  prolonged QT interval Atrial fibrillation Confirmed by Deretha Emory  MD,  Lorin Picket ([id number]) on 11/10/2014 12:52:32 PM Also confirmed by Deretha Emory  MD,  Arsenio Schnorr ([id number])  on 12/01/2014 7:17:43 AM      MDM   Final diagnoses:  Atrial fibrillation, unspecified   Patient with rapid atrial fibrillation heart rate at scene was 180. Brought in by EMS.  No chest pain. Chest x-ray negative for any pulmonary edema pneumothorax or pneumonia. Patient given 10 mg of diltiazem here with improvement in heart rate down to 100 or a little below.  Patient will be walked out to make sure that he is not lightheaded and stable if he's able to walk without any, getting factors can be discharged home continuing with his diltiazem and following up with his doctor.  I personally performed the services described in this documentation, which was scribed in my presence. The recorded information has been reviewed and is accurate.      Vanetta Mulders, MD 11/10/14 1617  Vanetta Mulders, MD 12/01/14 [phone]

## 2014-11-10 NOTE — Discharge Instructions (Signed)
Continue take your medicines. Make an appointment to follow-up with cardiologist. Return for any fast heart rate chest pain shortness of breath or feel like you pass out.

## 2014-11-10 NOTE — ED Notes (Signed)
Patient given discharge instruction, verbalized understand. IV removed, band aid applied. Patient ambulatory out of the department.  

## 2014-11-20 ENCOUNTER — Encounter: Payer: Self-pay | Admitting: Adult Health

## 2014-11-20 ENCOUNTER — Ambulatory Visit (INDEPENDENT_AMBULATORY_CARE_PROVIDER_SITE_OTHER): Payer: Medicaid Other | Admitting: Adult Health

## 2014-11-20 VITALS — BP 112/82 | HR 80 | Ht 68.0 in | Wt 384.0 lb

## 2014-11-20 DIAGNOSIS — I10 Essential (primary) hypertension: Secondary | ICD-10-CM

## 2014-11-20 DIAGNOSIS — I4819 Other persistent atrial fibrillation: Secondary | ICD-10-CM

## 2014-11-20 DIAGNOSIS — I481 Persistent atrial fibrillation: Secondary | ICD-10-CM

## 2014-11-20 NOTE — Assessment & Plan Note (Addendum)
He was seen in the emergency room on 11/10/2014 with atrial fib with RVR. He was treated with IV diltiazem, and has been continued on by mouth diltiazem. On review of labs. I did note that he was mildly dehydrated with a creatinine 1.76. He was given IV fluids in the ER. We may need to consider taking him off of HCTZ portion of the lisinopril/HCTZ if this recurs. He is advised to avoid caffeine, to drink fluids. I discussed the need to remove one of his diuretics, and he does not wish to do so at this time as he is feeling fine. I want to see him back in 3 months to reevaluate his status. If he has recurrent atrial fibrillation, and dehydration, will need to adjust his medications.he was not found to be anemic, and has no issues of bleeding.

## 2014-11-20 NOTE — Progress Notes (Signed)
HPI: Jeremy Rush is a 63 year old patient of Dr. Purvis Rush, that we follow for ongoing assessment and management of atrial fibrillation, aortic root enlargement, hypertension, most recent echo, revealing normal. Systolic function was reduced. RV systolic function, and mild to moderate aortic root dilatation. The patient was seen in the emergency room on 11/10/2014 with complaints of shortness of breath and nausea.   The patient was found to be tachycardic, with heart rate in the 180 beats per minute with a blood pressure of 125/79 with lower extremity edema. He was found to be in rapid atrial fibrillation. He was not found to be in heart failure or having pneumonia. He was treated with 10 mg of diltiazem IV with improvement in heart rate. He was discharged from the emergency room to be followed up with cardiology and was told to continue diltiazem as directed.  He comes today feeling much better. He recently lost his mother, who died within the last month, he also does not have a car suddenly as it broke down and he cannot afford to pay to have it repaired. He states he has been under a lot of stress as a result of these 2 situations. He is medically compliant. He states he is not drinking a lot of caffeine.he does not smoke, or use alcohol.he has had no recurrence of symptoms since visit to ER.       Allergies  Allergen Reactions  . Codeine Other (See Comments)    Mood swing  . Morphine And Related Other (See Comments)    Mood swing    Current Outpatient Prescriptions  Medication Sig Dispense Refill  . acetaminophen (TYLENOL) 500 MG tablet Take 500 mg by mouth every 6 (six) hours as needed for pain.    Marland Kitchen. diltiazem (CARDIZEM LA) 240 MG 24 hr tablet TAKE 1 TABLET BY MOUTH EVERY DAY (Patient taking differently: Take 240 mg by mouth daily. TAKE 1 TABLET BY MOUTH EVERY DAY) 30 tablet 6  . furosemide (LASIX) 20 MG tablet Take 1 tablet (20 mg total) by mouth daily. 30 tablet 6  .  lisinopril-hydrochlorothiazide (PRINZIDE,ZESTORETIC) 10-12.5 MG per tablet Take 1 tablet by mouth daily. 30 tablet 6  . loratadine (CLARITIN) 10 MG tablet Take 1 tablet (10 mg total) by mouth daily. (Patient taking differently: Take 10 mg by mouth daily as needed. ) 30 tablet 6  . rivaroxaban (XARELTO) 20 MG TABS tablet TAKE 1 TABLET (20 MG TOTAL) BY MOUTH DAILY. (Patient taking differently: Take 20 mg by mouth daily. TAKE 1 TABLET (20 MG TOTAL) BY MOUTH DAILY.) 30 tablet 6   No current facility-administered medications for this visit.    Past Medical History  Diagnosis Date  . Morbid obesity   . Hypertension   . DJD (degenerative joint disease)     Knees  . Atrial fibrillation 2014    Initial diagnosis in 12/2012  . Hyperlipidemia     Past Surgical History  Procedure Laterality Date  . Ventral hernia repair  1990s-2000    X4  . Abdominal debridement  2009    Dr. Ovidio Kinavid Newman, infected mesh/abscess  . Wrist surgery      Right    ROS: Complete review of systems performed and found to be negative unless outlined above  PHYSICAL EXAM BP 112/82 mmHg  Pulse 80  Ht 5\' 8"  (1.727 m)  Wt 384 lb (174.181 kg)  BMI 58.40 kg/m2 General: Well developed, well nourished, in no acute distress, morbidly obese Head: Eyes PERRLA, No  xanthomas.   Normal cephalic and atramatic  Lungs: Clear bilaterally to auscultation and percussion. Heart: HRIR S1 S2, without MRG.  Pulses are 2+ & equal.            No carotid bruit. No JVD.  No abdominal bruits. No femoral bruits. Abdomen: Bowel sounds are positive, abdomen soft and non-tender without masses or                  Hernia's noted. Msk:  Back normal, normal gait. Normal strength and tone for age. Extremities: No clubbing, cyanosis or edema.  DP +1 Neuro: Alert and oriented X 3. Psych:  Good affect, responds appropriately   ASSESSMENT AND PLAN

## 2014-11-20 NOTE — Assessment & Plan Note (Signed)
Consider obstructive sleep apnea in this setting, as one of causative factors of his hypertension and atrial fibrillation.. I do not see where this is been tested or diagnosed.

## 2014-11-20 NOTE — Progress Notes (Deleted)
Name: Jeremy Rush    DOB: 1951-11-03  Age: 63 y.o.  MR#: [id number]       PCP:  No PCP Per Patient      Insurance: Payor: MEDICAID [place] / Plan: MEDICAID [place] ACCESS / Product Type: *No Product type* /   CC:    Chief Complaint  Patient presents with  . Atrial Fibrillation  . Hypertension    VS Filed Vitals:   11/20/14 1432  BP: 112/82  Pulse: 80  Height: 5\' 8"  (1.727 m)  Weight: 384 lb (174.181 kg)    Weights Current Weight  11/20/14 384 lb (174.181 kg)  11/10/14 395 lb (179.171 kg)  06/30/14 395 lb (179.171 kg)    Blood Pressure  BP Readings from Last 3 Encounters:  11/20/14 112/82  11/10/14 114/91  06/30/14 132/80     Admit date:  (Not on file) Last encounter with RMR:  Visit date not found   Allergy Codeine and Morphine and related  Current Outpatient Prescriptions  Medication Sig Dispense Refill  . acetaminophen (TYLENOL) 500 MG tablet Take 500 mg by mouth every 6 (six) hours as needed for pain.    Marland Kitchen diltiazem (CARDIZEM LA) 240 MG 24 hr tablet TAKE 1 TABLET BY MOUTH EVERY DAY (Patient taking differently: Take 240 mg by mouth daily. TAKE 1 TABLET BY MOUTH EVERY DAY) 30 tablet 6  . furosemide (LASIX) 20 MG tablet Take 1 tablet (20 mg total) by mouth daily. 30 tablet 6  . lisinopril-hydrochlorothiazide (PRINZIDE,ZESTORETIC) 10-12.5 MG per tablet Take 1 tablet by mouth daily. 30 tablet 6  . loratadine (CLARITIN) 10 MG tablet Take 1 tablet (10 mg total) by mouth daily. (Patient taking differently: Take 10 mg by mouth daily as needed. ) 30 tablet 6  . rivaroxaban (XARELTO) 20 MG TABS tablet TAKE 1 TABLET (20 MG TOTAL) BY MOUTH DAILY. (Patient taking differently: Take 20 mg by mouth daily. TAKE 1 TABLET (20 MG TOTAL) BY MOUTH DAILY.) 30 tablet 6   No current facility-administered medications for this visit.    Discontinued Meds:   There are no discontinued medications.  Patient Active Problem List   Diagnosis Date Noted  . CHF (congestive heart failure)  05/08/2013  . Chronic anticoagulation 01/17/2013  . Hypertension   . Atrial fibrillation   . Wound infection, chronic anterior abdominal abscess. 09/22/2011  . Morbid obesity 09/22/2011    LABS    Component Value Date/Time   NA 139 11/10/2014 1130   NA 141 05/01/2013 1200   NA 139 01/18/2013 0447   K 4.2 11/10/2014 1130   K 4.0 05/01/2013 1200   K 3.9 01/18/2013 0447   CL 100 11/10/2014 1130   CL 105 05/01/2013 1200   CL 97 01/18/2013 0447   CO2 25 11/10/2014 1130   CO2 28 05/01/2013 1200   CO2 34* 01/18/2013 0447   GLUCOSE 121* 11/10/2014 1130   GLUCOSE 91 05/01/2013 1200   GLUCOSE 95 01/18/2013 0447   BUN 33* 11/10/2014 1130   BUN 17 05/01/2013 1200   BUN 16 01/18/2013 0447   CREATININE 1.73* 11/10/2014 1130   CREATININE 1.13 05/01/2013 1200   CREATININE 1.41* 01/18/2013 0447   CREATININE 1.11 01/17/2013 0826   CALCIUM 8.7 11/10/2014 1130   CALCIUM 8.9 05/01/2013 1200   CALCIUM 9.2 01/18/2013 0447   GFRNONAA 41* 11/10/2014 1130   GFRNONAA 53* 01/18/2013 0447   GFRNONAA 70* 01/17/2013 0826   GFRAA 47* 11/10/2014 1130   GFRAA 61* 01/18/2013 0447   GFRAA  82* 01/17/2013 0826   CMP     Component Value Date/Time   NA 139 11/10/2014 1130   K 4.2 11/10/2014 1130   CL 100 11/10/2014 1130   CO2 25 11/10/2014 1130   GLUCOSE 121* 11/10/2014 1130   BUN 33* 11/10/2014 1130   CREATININE 1.73* 11/10/2014 1130   CREATININE 1.13 05/01/2013 1200   CALCIUM 8.7 11/10/2014 1130   PROT 6.7 05/01/2013 1200   ALBUMIN 3.8 05/01/2013 1200   AST 11 05/01/2013 1200   ALT 8 05/01/2013 1200   ALKPHOS 62 05/01/2013 1200   BILITOT 1.0 05/01/2013 1200   GFRNONAA 41* 11/10/2014 1130   GFRAA 47* 11/10/2014 1130       Component Value Date/Time   WBC 9.6 11/10/2014 1130   WBC 6.2 01/17/2013 0826   WBC 6.3 01/16/2013 0713   HGB 14.3 11/10/2014 1130   HGB 14.9 01/17/2013 0826   HGB 14.5 01/16/2013 0713   HCT 44.5 11/10/2014 1130   HCT 46.8 01/17/2013 0826   HCT 43.8 01/16/2013  0713   MCV 87.3 11/10/2014 1130   MCV 87.6 01/17/2013 0826   MCV 84.4 01/16/2013 0713    Lipid Panel     Component Value Date/Time   CHOL  01/14/2008 0515    115        ATP III CLASSIFICATION:  <200     mg/dL   Desirable  161-096200-239  mg/dL   Borderline High  >=045>=240    mg/dL   High   TRIG 42 40/98/119102/23/2009 0515   HDL 21* 01/14/2008 0515   CHOLHDL 5.5 01/14/2008 0515   VLDL 8 01/14/2008 0515   LDLCALC  01/14/2008 0515    86        Total Cholesterol/HDL:CHD Risk Coronary Heart Disease Risk Table                     Men   Women  1/2 Average Risk   3.4   3.3    ABG    Component Value Date/Time   PHART 7.326* 01/16/2013 1030   PCO2ART 51.7* 01/16/2013 1030   PO2ART 96.9 01/16/2013 1030   HCO3 26.3* 01/16/2013 1030   TCO2 23.3 01/16/2013 1030   O2SAT 97.5 01/16/2013 1030     Lab Results  Component Value Date   TSH 1.918 01/16/2013   BNP (last 3 results) No results for input(s): PROBNP in the last 8760 hours. Cardiac Panel (last 3 results) No results for input(s): CKTOTAL, CKMB, TROPONINI, RELINDX in the last 72 hours.  Iron/TIBC/Ferritin/ %Sat No results found for: IRON, TIBC, FERRITIN, IRONPCTSAT   EKG Orders placed or performed during the hospital encounter of 11/10/14  . ED EKG  . ED EKG  . EKG 12-Lead  . EKG 12-Lead  . EKG     Prior Assessment and Plan Problem List as of 11/20/2014      Cardiovascular and Mediastinum   Hypertension   Last Assessment & Plan   05/08/2013 Office Visit Written 05/08/2013  2:04 PM by Wendall StadePeter C Nishan, MD    Well controlled.  Continue current medications and low sodium Dash type diet.       Atrial fibrillation   Last Assessment & Plan   05/08/2013 Office Visit Written 05/08/2013  2:05 PM by Wendall StadePeter C Nishan, MD    Good rate control and anticoagulatiion    CHF (congestive heart failure)   Last Assessment & Plan   05/08/2013 Office Visit Written 05/08/2013  2:06 PM by Noralyn PickPeter C  Eden EmmsNishan, MD    Imprvoed with compliance of ACE and HCTZ in  addtion to lasix  Weight down 9 lbs.  F/U with new [place] doctor 2-3 months        Other   Wound infection, chronic anterior abdominal abscess.   Last Assessment & Plan   05/01/2013 Office Visit Written 05/01/2013 11:20 AM by Jodelle GrossKathryn M Lawrence, NP    He had not been back to surgeon for reassessment and treatment. He is advised to do so.     Morbid obesity   Last Assessment & Plan   05/01/2013 Office Visit Written 05/01/2013 11:21 AM by Jodelle GrossKathryn M Lawrence, NP    Weight is up 10 lbs from previous visit. Uncertain if CHF or poor eating habits are to blame at this time.Awaiting labs.    Chronic anticoagulation   Last Assessment & Plan   05/08/2013 Office Visit Written 05/08/2013  2:05 PM by Wendall StadePeter C Nishan, MD    F/U coumadin clinic High risk for DVT and chronic afib No bleeding issues        Imaging: Dg Chest Portable 1 View  11/10/2014   CLINICAL DATA:  Shortness of breath since this morning, tachycardia, hypertension, atrial fibrillation  EXAM: PORTABLE CHEST - 1 VIEW  COMPARISON:  Portable exam 1156 hr compared to 01/16/2013  FINDINGS: Enlargement of cardiac silhouette.  Mediastinal contours and pulmonary vascularity normal for technique.  Lungs clear.  No pleural effusion or pneumothorax.  IMPRESSION: Enlargement of cardiac silhouette.  No definite acute abnormalities.   Electronically Signed   By: Ulyses SouthwardMark  Boles M.D.   On: 11/10/2014 12:10

## 2014-11-20 NOTE — Patient Instructions (Signed)
Your physician recommends that you schedule a follow-up appointment in: 3 months  Your physician recommends that you continue on your current medications as directed. Please refer to the Current Medication list given to you today.  Thank you for choosing [facility]!!    

## 2014-11-20 NOTE — Assessment & Plan Note (Signed)
Currently well-controlled. As stated, may remove HCTZ from his medication regimen. We will see him in 3 months. He will need a BMET.

## 2015-01-10 ENCOUNTER — Other Ambulatory Visit: Payer: Self-pay | Admitting: Adult Health

## 2015-01-12 NOTE — Telephone Encounter (Signed)
FAX FROM [facility] CARDIZEM 240 MG AND XARELTO 20 MG

## 2015-01-13 ENCOUNTER — Other Ambulatory Visit: Payer: Self-pay | Admitting: Cardiovascular Disease

## 2015-01-13 MED ORDER — DILTIAZEM HCL ER COATED BEADS 240 MG PO TB24: 240.0000 mg | ORAL_TABLET | Freq: Every day | ORAL | Status: DC

## 2015-01-13 MED ORDER — RIVAROXABAN 20 MG PO TABS: ORAL_TABLET | ORAL | Status: DC

## 2015-01-13 NOTE — Telephone Encounter (Signed)
escribed refill requests

## 2015-01-13 NOTE — Telephone Encounter (Signed)
Received fax refill request  Rx # [id number] Medication:  Xarelto 20 mg tablet Qty 30 Sig:  Take one tablet by mouth every day Physician:  Purvis SheffieldKoneswaran  Received fax refill request  Rx # [id number] Medication:  Cardizem LA 240 mg tablet Qty 30 Sig:  Take one tablet by mouth every day Physician:  Purvis SheffieldKoneswaran

## 2015-02-05 ENCOUNTER — Other Ambulatory Visit: Payer: Self-pay | Admitting: Cardiovascular Disease

## 2015-02-05 MED ORDER — DILTIAZEM HCL ER COATED BEADS 240 MG PO TB24: 240.0000 mg | ORAL_TABLET | Freq: Every day | ORAL | Status: DC

## 2015-02-05 NOTE — Telephone Encounter (Signed)
Received fax refill request  Rx # [id number] Medication:  Cardizem LA 240 mg tablet Qty 30 Sig:  Take one tablet by mouth every day Physician:  Purvis SheffieldKoneswaran

## 2015-02-06 ENCOUNTER — Telehealth: Payer: Self-pay

## 2015-02-06 ENCOUNTER — Ambulatory Visit (HOSPITAL_COMMUNITY)
Admission: RE | Admit: 2015-02-06 | Discharge: 2015-02-06 | Disposition: A | Discharge status: Home / Self Care | Payer: Medicaid Other | Source: Ambulatory Visit | Attending: Cardiovascular Disease | Admitting: Cardiovascular Disease

## 2015-02-06 DIAGNOSIS — I77819 Aortic ectasia, unspecified site: Secondary | ICD-10-CM

## 2015-02-06 DIAGNOSIS — I712 Thoracic aortic aneurysm, without rupture, unspecified: Secondary | ICD-10-CM

## 2015-02-06 DIAGNOSIS — I2510 Atherosclerotic heart disease of native coronary artery without angina pectoris: Secondary | ICD-10-CM | POA: Diagnosis not present

## 2015-02-06 DIAGNOSIS — I10 Essential (primary) hypertension: Secondary | ICD-10-CM | POA: Diagnosis not present

## 2015-02-06 DIAGNOSIS — R911 Solitary pulmonary nodule: Secondary | ICD-10-CM | POA: Diagnosis not present

## 2015-02-06 DIAGNOSIS — I4891 Unspecified atrial fibrillation: Secondary | ICD-10-CM | POA: Diagnosis not present

## 2015-02-06 LAB — POCT I-STAT CREATININE: Creatinine, Ser: 1.3 mg/dL (ref 0.50–1.35)

## 2015-02-06 MED ORDER — IOHEXOL 350 MG/ML SOLN
100.0000 mL | Freq: Once | INTRAVENOUS | Status: AC | PRN
  Administered 2015-02-06: 100 mL via INTRAVENOUS

## 2015-02-06 MED ORDER — SODIUM CHLORIDE 0.9 % IJ SOLN
INTRAMUSCULAR | Status: AC
  Filled 2015-02-06: qty 3

## 2015-02-06 NOTE — Telephone Encounter (Signed)
That would be fine, as per his discretion.

## 2015-02-06 NOTE — Telephone Encounter (Signed)
-----   Message from Laqueta LindenSuresh A Koneswaran, MD sent at 02/06/2015  1:11 PM EDT ----- Ascending thoracic aorta measures up to 5 cm. Would have him see CT surgery for evaluation.

## 2015-02-06 NOTE — Telephone Encounter (Signed)
Pt declines to see surgeon until after he meets with you in follow up.has apt with Ms lawrence on 3/31,are you agreeable with this ? i placed referral anyway

## 2015-02-09 ENCOUNTER — Telehealth: Payer: Self-pay | Admitting: *Deleted

## 2015-02-09 NOTE — Telephone Encounter (Signed)
Pt didn't understand what 5 cm was (I explained it was measurement like inches,miles etc only in the metric system ) and he would like to talk more with Dr Purvis SheffieldKoneswaran at apt next week to understand Spt cx tomorrow with CV consult

## 2015-02-09 NOTE — Telephone Encounter (Signed)
Pt would like to talk to nurse about what a "5" means when related to his heart

## 2015-02-10 ENCOUNTER — Encounter: Payer: Medicaid Other | Admitting: Thoracic Surgery (Cardiothoracic Vascular Surgery)

## 2015-02-19 ENCOUNTER — Ambulatory Visit (INDEPENDENT_AMBULATORY_CARE_PROVIDER_SITE_OTHER): Payer: Medicaid Other | Admitting: Adult Health

## 2015-02-19 ENCOUNTER — Encounter: Payer: Self-pay | Admitting: Adult Health

## 2015-02-19 VITALS — BP 114/84 | HR 94 | Ht 68.0 in | Wt 388.0 lb

## 2015-02-19 DIAGNOSIS — I481 Persistent atrial fibrillation: Secondary | ICD-10-CM

## 2015-02-19 DIAGNOSIS — I712 Thoracic aortic aneurysm, without rupture, unspecified: Secondary | ICD-10-CM

## 2015-02-19 DIAGNOSIS — I4819 Other persistent atrial fibrillation: Secondary | ICD-10-CM

## 2015-02-19 DIAGNOSIS — I10 Essential (primary) hypertension: Secondary | ICD-10-CM | POA: Diagnosis not present

## 2015-02-19 NOTE — Progress Notes (Signed)
Cardiology Office Note   Date:  02/19/2015   ID:  Jeremy RaringDavid B Mcglaughlin, DOB 06/05/1951, MRN [id number]  PCP:  No PCP Per Patient  Cardiologist:  Inis SizerKoneswaran/ Lynk Marti, NP   Chief Complaint  Patient presents with  . Atrial Fibrillation  . Hypertension      History of Present Illness: Jeremy Rush is a [age] y.o. male who presents for ongoing assessment and management of atrial fibrillation, history of aortic root enlargement, hypertension, with reduced RV systolic fx. The patient was last seen in the office in December 2015, at that time, was hemodynamically stable without any cardiac complaints.  When we saw him last he had just recently been to the emergency room with atrial fib with RVR and be placed on by mouth diltiazem.  His creatinine was mildly elevated 1.76 and discussion was to consider removing HCTZ portion of lisinopril, HCTZ.  He was to avoid caffeine and drink fluids.  A followup BMET was ordered.There are no results available for this.  He had CT of the Chest to evaluate known AAA. 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 Recommend semi annual imaging follow up by CTA of MRA. He was referred to CVTS but refused to see them until coming back to us to talk about it.   He is asymptomatic and is medically compliant.   Past Medical History  Diagnosis Date  . Morbid obesity   . Hypertension   . DJD (degenerative joint disease)     Knees  . Atrial fibrillation 2014    Initial diagnosis in 12/2012  . Hyperlipidemia     Past Surgical History  Procedure Laterality Date  . Ventral hernia repair  1990s-2000    X4  . Abdominal debridement  2009    Dr. Ovidio Kinavid Newman, infected mesh/abscess  . Wrist surgery      Right     Current Outpatient Prescriptions  Medication Sig Dispense Refill  . acetaminophen (TYLENOL) 500 MG tablet Take 500 mg by mouth every 6 (six) hours as needed for pain.    Marland Kitchen. diltiazem  (CARDIZEM LA) 240 MG 24 hr tablet Take 1 tablet (240 mg total) by mouth daily. TAKE 1 TABLET BY MOUTH EVERY DAY 30 tablet 6  . furosemide (LASIX) 20 MG tablet Take 1 tablet (20 mg total) by mouth daily. 30 tablet 6  . lisinopril-hydrochlorothiazide (PRINZIDE,ZESTORETIC) 10-12.5 MG per tablet Take 1 tablet by mouth daily. 30 tablet 6  . loratadine (CLARITIN) 10 MG tablet TAKE 1 TABLET BY MOUTH DAILY 30 tablet 3  . rivaroxaban (XARELTO) 20 MG TABS tablet TAKE 1 TABLET (20 MG TOTAL) BY MOUTH DAILY. 30 tablet 6   No current facility-administered medications for this visit.    Allergies:   Codeine and Morphine and related    Social History:  The patient  reports that he has never smoked. He has never used smokeless tobacco. He reports that he does not drink alcohol or use illicit drugs.   Family History:  The patient's family history is not on file.    ROS: .   All other systems are reviewed and negative.Unless otherwise mentioned in H&P above.   PHYSICAL EXAM: VS:  BP 114/84 mmHg  Pulse 94  Ht 5\' 8"  (1.727 m)  Wt 388 lb (175.996 kg)  BMI 59.01 kg/m2  SpO2 95% , BMI Body mass index is 59.01 kg/(m^2). GEN: Well nourished, well developed, in no acute distressMorbidly obese.  HEENT:  normal Neck: no JVD, carotid bruits, or masses Cardiac: IRRR; no murmurs, rubs, or gallops,no edema  Respiratory:  Clear to auscultation bilaterally, normal work of breathing GI: soft, nontender, nondistended, + BS MS: no deformity or atrophy Skin: warm and dry, no rash Neuro:  Strength and sensation are intact Psych: euthymic mood, full affect   Recent Labs: 11/10/2014: BUN 33*; Hemoglobin 14.3; Platelets 170; Potassium 4.2; Sodium 139 02/06/2015: Creatinine 1.30    Lipid Panel    Component Value Date/Time   CHOL  01/14/2008 0515    115        ATP III CLASSIFICATION:  <200     mg/dL   Desirable  119-147  mg/dL   Borderline High  >=829    mg/dL   High   TRIG 42 56/21/3086 0515   HDL 21*  01/14/2008 0515   CHOLHDL 5.5 01/14/2008 0515   VLDL 8 01/14/2008 0515   LDLCALC  01/14/2008 0515    86        Total Cholesterol/HDL:CHD Risk Coronary Heart Disease Risk Table                     Men   Women  1/2 Average Risk   3.4   3.3      Wt Readings from Last 3 Encounters:  02/19/15 388 lb (175.996 kg)  11/20/14 384 lb (174.181 kg)  11/10/14 395 lb (179.171 kg)      Other studies Reviewed: Additional studies/ records that were reviewed today include: CT of chest. Review of the above records demonstrates: 5.0 cm AAA in the thoracic aorta.   ASSESSMENT AND PLAN:  1. AAA: I have spoken at length with the patient using illustrations and heart model to explain AAA and need to see CVTS for evaluation for repair. He does not wish to go to CVTS at this time. He wants to wait until he sees [place] again to make a decision. I have explained to him the risks to include sudden death,  and need for close follow up. He verbalizes understanding. Information is printed concerning AAA and given to him along with a copy of his CT scan report. He is to call us for severe chest pain,  2. Atrial fibrillation: Heart rate is well controlled.CHADS VASC Score of 3. He will continue on Xarelto 20 mg daily and dilitazem.   3. Hypertension: Low normal today. I would like to decrease lisinopril to eliminate HCTZ portion of lisinopril He will follow up in 6 months.     Current medicines are reviewed at length with the patient today.    Labs/ tests ordered today include: None No orders of the defined types were placed in this encounter.     Disposition:   FU with cardiology 6 months.   Signed, Joni Reining, NP  02/19/2015 1:35 PM    [facility] [address], [place], [place] [address] Phone: [phone]; Fax: [phone]

## 2015-02-19 NOTE — Progress Notes (Deleted)
Name: Jeremy Rush    DOB: December 23, 1950  Age: 64 y.o.  MR#: [id number]       PCP:  No PCP Per Patient      Insurance: Payor: MEDICAID [place] / Plan: MEDICAID [place] ACCESS / Product Type: *No Product type* /   CC:    Chief Complaint  Patient presents with  . Atrial Fibrillation  . Hypertension    VS Filed Vitals:   02/19/15 1313  BP: 114/84  Pulse: 94  Height:  (1.727 m)  Weight: 388 lb (175.996 kg)  SpO2: 95%    Weights Current Weight  02/19/15 388 lb (175.996 kg)  11/20/14 384 lb (174.181 kg)  11/10/14 395 lb (179.171 kg)    Blood Pressure  BP Readings from Last 3 Encounters:  02/19/15 114/84  11/20/14 112/82  11/10/14 114/91     Admit date:  (Not on file) Last encounter with RMR:  01/10/2015   Allergy Codeine and Morphine and related  Current Outpatient Prescriptions  Medication Sig Dispense Refill  . acetaminophen (TYLENOL) 500 MG tablet Take 500 mg by mouth every 6 (six) hours as needed for pain.    Marland Kitchen diltiazem (CARDIZEM LA) 240 MG 24 hr tablet Take 1 tablet (240 mg total) by mouth daily. TAKE 1 TABLET BY MOUTH EVERY DAY 30 tablet 6  . furosemide (LASIX) 20 MG tablet Take 1 tablet (20 mg total) by mouth daily. 30 tablet 6  . lisinopril-hydrochlorothiazide (PRINZIDE,ZESTORETIC) 10-12.5 MG per tablet Take 1 tablet by mouth daily. 30 tablet 6  . loratadine (CLARITIN) 10 MG tablet TAKE 1 TABLET BY MOUTH DAILY 30 tablet 3  . rivaroxaban (XARELTO) 20 MG TABS tablet TAKE 1 TABLET (20 MG TOTAL) BY MOUTH DAILY. 30 tablet 6   No current facility-administered medications for this visit.    Discontinued Meds:   There are no discontinued medications.  Patient Active Problem List   Diagnosis Date Noted  . CHF (congestive heart failure) 05/08/2013  . Chronic anticoagulation 01/17/2013  . Hypertension   . Atrial fibrillation   . Wound infection, chronic anterior abdominal abscess. 09/22/2011  . Morbid obesity 09/22/2011    LABS    Component Value Date/Time   NA  139 11/10/2014 1130   NA 141 05/01/2013 1200   NA 139 01/18/2013 0447   K 4.2 11/10/2014 1130   K 4.0 05/01/2013 1200   K 3.9 01/18/2013 0447   CL 100 11/10/2014 1130   CL 105 05/01/2013 1200   CL 97 01/18/2013 0447   CO2 25 11/10/2014 1130   CO2 28 05/01/2013 1200   CO2 34* 01/18/2013 0447   GLUCOSE 121* 11/10/2014 1130   GLUCOSE 91 05/01/2013 1200   GLUCOSE 95 01/18/2013 0447   BUN 33* 11/10/2014 1130   BUN 17 05/01/2013 1200   BUN 16 01/18/2013 0447   CREATININE 1.30 02/06/2015 1000   CREATININE 1.73* 11/10/2014 1130   CREATININE 1.13 05/01/2013 1200   CREATININE 1.41* 01/18/2013 0447   CALCIUM 8.7 11/10/2014 1130   CALCIUM 8.9 05/01/2013 1200   CALCIUM 9.2 01/18/2013 0447   GFRNONAA 41* 11/10/2014 1130   GFRNONAA 53* 01/18/2013 0447   GFRNONAA 70* 01/17/2013 0826   GFRAA 47* 11/10/2014 1130   GFRAA 61* 01/18/2013 0447   GFRAA 82* 01/17/2013 0826   CMP     Component Value Date/Time   NA 139 11/10/2014 1130   K 4.2 11/10/2014 1130   CL 100 11/10/2014 1130   CO2 25 11/10/2014 1130   GLUCOSE  121* 11/10/2014 1130   BUN 33* 11/10/2014 1130   CREATININE 1.30 02/06/2015 1000   CREATININE 1.13 05/01/2013 1200   CALCIUM 8.7 11/10/2014 1130   PROT 6.7 05/01/2013 1200   ALBUMIN 3.8 05/01/2013 1200   AST 11 05/01/2013 1200   ALT 8 05/01/2013 1200   ALKPHOS 62 05/01/2013 1200   BILITOT 1.0 05/01/2013 1200   GFRNONAA 41* 11/10/2014 1130   GFRAA 47* 11/10/2014 1130       Component Value Date/Time   WBC 9.6 11/10/2014 1130   WBC 6.2 01/17/2013 0826   WBC 6.3 01/16/2013 0713   HGB 14.3 11/10/2014 1130   HGB 14.9 01/17/2013 0826   HGB 14.5 01/16/2013 0713   HCT 44.5 11/10/2014 1130   HCT 46.8 01/17/2013 0826   HCT 43.8 01/16/2013 0713   MCV 87.3 11/10/2014 1130   MCV 87.6 01/17/2013 0826   MCV 84.4 01/16/2013 0713    Lipid Panel     Component Value Date/Time   CHOL  01/14/2008 0515    115        ATP III CLASSIFICATION:  <200     mg/dL   Desirable  409-811   mg/dL   Borderline High  >=914    mg/dL   High   TRIG 42 78/29/5621 0515   HDL 21* 01/14/2008 0515   CHOLHDL 5.5 01/14/2008 0515   VLDL 8 01/14/2008 0515   LDLCALC  01/14/2008 0515    86        Total Cholesterol/HDL:CHD Risk Coronary Heart Disease Risk Table                     Men   Women  1/2 Average Risk   3.4   3.3    ABG    Component Value Date/Time   PHART 7.326* 01/16/2013 1030   PCO2ART 51.7* 01/16/2013 1030   PO2ART 96.9 01/16/2013 1030   HCO3 26.3* 01/16/2013 1030   TCO2 23.3 01/16/2013 1030   O2SAT 97.5 01/16/2013 1030     Lab Results  Component Value Date   TSH 1.918 01/16/2013   BNP (last 3 results) No results for input(s): BNP in the last 8760 hours.  ProBNP (last 3 results) No results for input(s): PROBNP in the last 8760 hours.  Cardiac Panel (last 3 results) No results for input(s): CKTOTAL, CKMB, TROPONINI, RELINDX in the last 72 hours.  Iron/TIBC/Ferritin/ %Sat No results found for: IRON, TIBC, FERRITIN, IRONPCTSAT   EKG Orders placed or performed during the hospital encounter of 11/10/14  . ED EKG  . ED EKG  . EKG 12-Lead  . EKG 12-Lead  . EKG     Prior Assessment and Plan Problem List as of 02/19/2015      Cardiovascular and Mediastinum   Hypertension   Last Assessment & Plan 11/20/2014 Office Visit Written 11/20/2014  4:37 PM by Jodelle Gross, NP    Currently well-controlled. As stated, may remove HCTZ from his medication regimen. We will see him in 3 months. He will need a BMET.      Atrial fibrillation   Last Assessment & Plan 11/20/2014 Office Visit Edited 11/20/2014  4:36 PM by Jodelle Gross, NP    He was seen in the emergency room on 11/10/2014 with atrial fib with RVR. He was treated with IV diltiazem, and has been continued on by mouth diltiazem. On review of labs. I did note that he was mildly dehydrated with a creatinine 1.76. He was given IV fluids  in the ER. We may need to consider taking him off of HCTZ portion of  the lisinopril/HCTZ if this recurs. He is advised to avoid caffeine, to drink fluids. I discussed the need to remove one of his diuretics, and he does not wish to do so at this time as he is feeling fine. I want to see him back in 3 months to reevaluate his status. If he has recurrent atrial fibrillation, and dehydration, will need to adjust his medications.he was not found to be anemic, and has no issues of bleeding.      CHF (congestive heart failure)   Last Assessment & Plan 05/08/2013 Office Visit Written 05/08/2013  2:06 PM by Wendall Stade, MD    Imprvoed with compliance of ACE and HCTZ in addtion to lasix  Weight down 9 lbs.  F/U with new [place] doctor 2-3 months          Other   Wound infection, chronic anterior abdominal abscess.   Last Assessment & Plan 05/01/2013 Office Visit Written 05/01/2013 11:20 AM by Jodelle Gross, NP    He had not been back to surgeon for reassessment and treatment. He is advised to do so.       Morbid obesity   Last Assessment & Plan 11/20/2014 Office Visit Written 11/20/2014  4:38 PM by Jodelle Gross, NP    Consider obstructive sleep apnea in this setting, as one of causative factors of his hypertension and atrial fibrillation.. I do not see where this is been tested or diagnosed.      Chronic anticoagulation   Last Assessment & Plan 05/08/2013 Office Visit Written 05/08/2013  2:05 PM by Wendall Stade, MD    F/U coumadin clinic High risk for DVT and chronic afib No bleeding issues          Imaging: Ct Angio Chest W/cm &/or Wo Cm  02/06/2015   CLINICAL DATA:  64 year old with hypertension and atrial fibrillation. Evaluate for aortic root dilatation.  EXAM: CT ANGIOGRAPHY CHEST WITH CONTRAST  TECHNIQUE: Multidetector CT imaging of the chest was performed using the standard protocol during bolus administration of intravenous contrast. Multiplanar CT image reconstructions and MIPs were obtained to evaluate the vascular anatomy.  CONTRAST:   OMNIPAQUE IOHEXOL 350 MG/ML SOLN  COMPARISON:  Chest radiograph 11/10/2014  FINDINGS: Thoracic aorta: There is poor contrast opacification of the thoracic aorta. Ascending thoracic aorta measures up to 5 cm. The sinotubular junction is not well demonstrated but roughly measures 3.7 cm. There are coronary artery calcifications. Normal configuration of the aortic arch and the great vessels are patent. No evidence for an aortic dissection. The aortic arch measures 3.4 cm. The descending thoracic aorta measures 3.3 cm. Normal caliber of the proximal abdominal aorta. There is contrast within the small collateral veins throughout the right upper chest. These collateral vessels could signify an underlying venous stenosis in the right upper extremity. No gross abnormality in the right brachiocephalic vein or SVC.  Nonvascular structures: Images of the upper abdomen are essentially unremarkable. Difficult to exclude a small nodule at the left adrenal gland. There is no significant pericardial or pleural fluid. No evidence for mediastinal or hilar lymphadenopathy. Few prominent axillary lymph nodes bilaterally are nonspecific.  The trachea and mainstem bronchi are patent. Linear density in the right upper lobe is compatible with scarring or atelectasis. No large areas of consolidation or airspace disease in the lungs. There is a punctate nodule in the left upper lobe on sequence  5, image 50 that measures 3 mm. No acute bone abnormality.  Review of the MIP images confirms the above findings.  IMPRESSION: Ascending thoracic aortic aneurysm. Ascending thoracic aorta measures up to 5.0 cm. Recommend semi-annual imaging followup by CTA or MRA and referral to cardiothoracic surgery if not already obtained. This recommendation follows 2010 ACCF/AHA/AATS/ACR/ASA/SCA/SCAI/SIR/STS/SVM Guidelines for the Diagnosis and Management of Patients With Thoracic Aortic Disease. Circulation. 2010; 121: [id number]  Coronary artery  calcifications.  Indeterminate 3 mm nodule in the left upper lobe. If the patient is at high risk for bronchogenic carcinoma, follow-up chest CT at 1 year is recommended. If the patient is at low risk, no follow-up is needed. This recommendation follows the consensus statement: Guidelines for Management of Small Pulmonary Nodules Detected on CT Scans: A Statement from the Fleischner Society as published in Radiology 2005; 237:395-400.  Collateral venous structures in the right upper chest. Cannot exclude a stenosis involving the right upper extremity veins.   Electronically Signed   By: Richarda OverlieAdam  Henn M.D.   On: 02/06/2015 12:02

## 2015-02-19 NOTE — Patient Instructions (Addendum)
Your physician wants you to follow-up in: 6 months with Kathryn Lawrence, NP. You will receive a reminder letter in the mail two months in advance. If you don't receive a letter, please call our office to schedule the follow-up appointment.  Your physician recommends that you continue on your current medications as directed. Please refer to the Current Medication list given to you today.  Thank you for choosing [facility]!   

## 2015-02-20 ENCOUNTER — Telehealth: Payer: Self-pay | Admitting: Adult Health

## 2015-02-20 ENCOUNTER — Other Ambulatory Visit: Payer: Self-pay | Admitting: *Deleted

## 2015-02-20 MED ORDER — LISINOPRIL 10 MG PO TABS: 10.0000 mg | ORAL_TABLET | Freq: Every day | ORAL | Status: DC

## 2015-02-20 NOTE — Telephone Encounter (Signed)
Patient notified that Rx has been called into pharmacy

## 2015-02-20 NOTE — Telephone Encounter (Signed)
Would like return phone call regarding medication instructions from yesterday's visit / tg

## 2015-02-24 ENCOUNTER — Other Ambulatory Visit: Payer: Self-pay | Admitting: Adult Health

## 2015-02-24 MED ORDER — FUROSEMIDE 20 MG PO TABS: ORAL_TABLET | ORAL | Status: DC

## 2015-02-24 NOTE — Telephone Encounter (Signed)
Needs refill on Furosemide sent to [facility] / tg

## 2015-02-24 NOTE — Telephone Encounter (Signed)
Refill complete 

## 2015-02-25 ENCOUNTER — Other Ambulatory Visit: Payer: Self-pay

## 2015-02-25 MED ORDER — FUROSEMIDE 20 MG PO TABS: ORAL_TABLET | ORAL | Status: DC

## 2015-02-25 NOTE — Telephone Encounter (Signed)
Refill complete 

## 2015-02-26 ENCOUNTER — Telehealth: Payer: Self-pay | Admitting: *Deleted

## 2015-02-26 NOTE — Telephone Encounter (Signed)
Patient does not want to come to the office as of now, will call back later if he decides to make appointment.

## 2015-03-03 ENCOUNTER — Encounter: Payer: Medicaid Other | Admitting: Cardiothoracic Surgery

## 2015-06-19 ENCOUNTER — Other Ambulatory Visit: Payer: Self-pay | Admitting: Adult Health

## 2015-07-04 ENCOUNTER — Emergency Department (HOSPITAL_COMMUNITY): Payer: Medicaid Other

## 2015-07-04 ENCOUNTER — Encounter (HOSPITAL_COMMUNITY): Payer: Self-pay | Admitting: *Deleted

## 2015-07-04 ENCOUNTER — Inpatient Hospital Stay (HOSPITAL_COMMUNITY)
Admission: EM | Admit: 2015-07-04 | Discharge: 2015-07-06 | DRG: 309 | Disposition: A | Discharge status: Home / Self Care | Payer: Medicaid Other | Attending: Internal Medicine | Admitting: Internal Medicine

## 2015-07-04 DIAGNOSIS — I482 Chronic atrial fibrillation: Secondary | ICD-10-CM | POA: Diagnosis present

## 2015-07-04 DIAGNOSIS — R6510 Systemic inflammatory response syndrome (SIRS) of non-infectious origin without acute organ dysfunction: Secondary | ICD-10-CM

## 2015-07-04 DIAGNOSIS — Y838 Other surgical procedures as the cause of abnormal reaction of the patient, or of later complication, without mention of misadventure at the time of the procedure: Secondary | ICD-10-CM

## 2015-07-04 DIAGNOSIS — N179 Acute kidney failure, unspecified: Secondary | ICD-10-CM | POA: Diagnosis present

## 2015-07-04 DIAGNOSIS — I10 Essential (primary) hypertension: Secondary | ICD-10-CM | POA: Diagnosis present

## 2015-07-04 DIAGNOSIS — G8929 Other chronic pain: Secondary | ICD-10-CM | POA: Diagnosis present

## 2015-07-04 DIAGNOSIS — B9689 Other specified bacterial agents as the cause of diseases classified elsewhere: Secondary | ICD-10-CM

## 2015-07-04 DIAGNOSIS — F40240 Claustrophobia: Secondary | ICD-10-CM | POA: Diagnosis present

## 2015-07-04 DIAGNOSIS — R509 Fever, unspecified: Secondary | ICD-10-CM

## 2015-07-04 DIAGNOSIS — L02211 Cutaneous abscess of abdominal wall: Secondary | ICD-10-CM | POA: Diagnosis present

## 2015-07-04 DIAGNOSIS — L089 Local infection of the skin and subcutaneous tissue, unspecified: Secondary | ICD-10-CM | POA: Diagnosis present

## 2015-07-04 DIAGNOSIS — R0602 Shortness of breath: Secondary | ICD-10-CM

## 2015-07-04 DIAGNOSIS — I5022 Chronic systolic (congestive) heart failure: Secondary | ICD-10-CM | POA: Diagnosis present

## 2015-07-04 DIAGNOSIS — E785 Hyperlipidemia, unspecified: Secondary | ICD-10-CM | POA: Diagnosis present

## 2015-07-04 DIAGNOSIS — I4891 Unspecified atrial fibrillation: Secondary | ICD-10-CM | POA: Diagnosis present

## 2015-07-04 DIAGNOSIS — Z6841 Body Mass Index (BMI) 40.0 and over, adult: Secondary | ICD-10-CM

## 2015-07-04 DIAGNOSIS — R740 Nonspecific elevation of levels of transaminase and lactic acid dehydrogenase [LDH]: Secondary | ICD-10-CM

## 2015-07-04 DIAGNOSIS — Z79899 Other long term (current) drug therapy: Secondary | ICD-10-CM

## 2015-07-04 DIAGNOSIS — I481 Persistent atrial fibrillation: Secondary | ICD-10-CM

## 2015-07-04 DIAGNOSIS — M170 Bilateral primary osteoarthritis of knee: Secondary | ICD-10-CM | POA: Diagnosis present

## 2015-07-04 DIAGNOSIS — T8189XA Other complications of procedures, not elsewhere classified, initial encounter: Secondary | ICD-10-CM | POA: Diagnosis present

## 2015-07-04 DIAGNOSIS — R21 Rash and other nonspecific skin eruption: Secondary | ICD-10-CM | POA: Diagnosis present

## 2015-07-04 DIAGNOSIS — Z9109 Other allergy status, other than to drugs and biological substances: Secondary | ICD-10-CM

## 2015-07-04 DIAGNOSIS — Z7901 Long term (current) use of anticoagulants: Secondary | ICD-10-CM

## 2015-07-04 DIAGNOSIS — E872 Acidosis: Secondary | ICD-10-CM | POA: Diagnosis present

## 2015-07-04 DIAGNOSIS — T148XXA Other injury of unspecified body region, initial encounter: Secondary | ICD-10-CM

## 2015-07-04 DIAGNOSIS — R6520 Severe sepsis without septic shock: Secondary | ICD-10-CM

## 2015-07-04 DIAGNOSIS — Z9889 Other specified postprocedural states: Secondary | ICD-10-CM

## 2015-07-04 DIAGNOSIS — R0600 Dyspnea, unspecified: Secondary | ICD-10-CM

## 2015-07-04 DIAGNOSIS — I959 Hypotension, unspecified: Secondary | ICD-10-CM

## 2015-07-04 DIAGNOSIS — A419 Sepsis, unspecified organism: Secondary | ICD-10-CM | POA: Diagnosis present

## 2015-07-04 DIAGNOSIS — I77810 Thoracic aortic ectasia: Secondary | ICD-10-CM

## 2015-07-04 DIAGNOSIS — B999 Unspecified infectious disease: Secondary | ICD-10-CM

## 2015-07-04 DIAGNOSIS — I509 Heart failure, unspecified: Secondary | ICD-10-CM

## 2015-07-04 DIAGNOSIS — T8130XD Disruption of wound, unspecified, subsequent encounter: Secondary | ICD-10-CM

## 2015-07-04 DIAGNOSIS — T8189XD Other complications of procedures, not elsewhere classified, subsequent encounter: Secondary | ICD-10-CM | POA: Diagnosis not present

## 2015-07-04 DIAGNOSIS — S31109D Unspecified open wound of abdominal wall, unspecified quadrant without penetration into peritoneal cavity, subsequent encounter: Secondary | ICD-10-CM | POA: Diagnosis not present

## 2015-07-04 DIAGNOSIS — T814XXA Infection following a procedure, initial encounter: Secondary | ICD-10-CM | POA: Diagnosis present

## 2015-07-04 DIAGNOSIS — M549 Dorsalgia, unspecified: Secondary | ICD-10-CM | POA: Diagnosis present

## 2015-07-04 DIAGNOSIS — I5023 Acute on chronic systolic (congestive) heart failure: Secondary | ICD-10-CM | POA: Diagnosis not present

## 2015-07-04 LAB — URINALYSIS, ROUTINE W REFLEX MICROSCOPIC
Glucose, UA: NEGATIVE mg/dL
Hgb urine dipstick: NEGATIVE
Ketones, ur: 15 mg/dL — AB
LEUKOCYTES UA: NEGATIVE
Nitrite: NEGATIVE
PH: 5.5 (ref 5.0–8.0)
PROTEIN: NEGATIVE mg/dL
Specific Gravity, Urine: 1.025 (ref 1.005–1.030)
Urobilinogen, UA: 1 mg/dL (ref 0.0–1.0)

## 2015-07-04 LAB — I-STAT CG4 LACTIC ACID, ED
LACTIC ACID, VENOUS: 2.23 mmol/L — AB (ref 0.5–2.0)
LACTIC ACID, VENOUS: 2.77 mmol/L — AB (ref 0.5–2.0)

## 2015-07-04 LAB — CBC WITH DIFFERENTIAL/PLATELET
Basophils Absolute: 0 10*3/uL (ref 0.0–0.1)
Basophils Relative: 0 % (ref 0–1)
EOS PCT: 1 % (ref 0–5)
Eosinophils Absolute: 0.1 10*3/uL (ref 0.0–0.7)
HCT: 43.4 % (ref 39.0–52.0)
HEMOGLOBIN: 13.8 g/dL (ref 13.0–17.0)
LYMPHS PCT: 3 % — AB (ref 12–46)
Lymphs Abs: 0.3 10*3/uL — ABNORMAL LOW (ref 0.7–4.0)
MCH: 26.7 pg (ref 26.0–34.0)
MCHC: 31.8 g/dL (ref 30.0–36.0)
MCV: 84.1 fL (ref 78.0–100.0)
Monocytes Absolute: 0.2 10*3/uL (ref 0.1–1.0)
Monocytes Relative: 2 % — ABNORMAL LOW (ref 3–12)
NEUTROS PCT: 94 % — AB (ref 43–77)
Neutro Abs: 8.7 10*3/uL — ABNORMAL HIGH (ref 1.7–7.7)
PLATELETS: 147 10*3/uL — AB (ref 150–400)
RBC: 5.16 MIL/uL (ref 4.22–5.81)
RDW: 14.8 % (ref 11.5–15.5)
WBC: 9.2 10*3/uL (ref 4.0–10.5)

## 2015-07-04 LAB — COMPREHENSIVE METABOLIC PANEL
ALT: 12 U/L — AB (ref 17–63)
ANION GAP: 9 (ref 5–15)
AST: 19 U/L (ref 15–41)
Albumin: 3.5 g/dL (ref 3.5–5.0)
Alkaline Phosphatase: 43 U/L (ref 38–126)
BILIRUBIN TOTAL: 1.1 mg/dL (ref 0.3–1.2)
BUN: 25 mg/dL — AB (ref 6–20)
CO2: 24 mmol/L (ref 22–32)
CREATININE: 1.32 mg/dL — AB (ref 0.61–1.24)
Calcium: 8.4 mg/dL — ABNORMAL LOW (ref 8.9–10.3)
Chloride: 105 mmol/L (ref 101–111)
GFR calc Af Amer: >60 mL/min (ref >60)
GFR calc non Af Amer: 56 mL/min — ABNORMAL LOW (ref >60)
GLUCOSE: 103 mg/dL — AB (ref 65–99)
POTASSIUM: 3.6 mmol/L (ref 3.5–5.1)
Sodium: 138 mmol/L (ref 135–145)
Total Protein: 6.8 g/dL (ref 6.5–8.1)

## 2015-07-04 LAB — PROTIME-INR
INR: 1.72 — ABNORMAL HIGH (ref 0.00–1.49)
PROTHROMBIN TIME: 20.2 s — AB (ref 11.6–15.2)

## 2015-07-04 LAB — TROPONIN I

## 2015-07-04 LAB — BRAIN NATRIURETIC PEPTIDE: B NATRIURETIC PEPTIDE 5: 123 pg/mL — AB (ref 0.0–100.0)

## 2015-07-04 LAB — MRSA PCR SCREENING: MRSA by PCR: NEGATIVE

## 2015-07-04 LAB — LACTIC ACID, PLASMA: LACTIC ACID, VENOUS: 2.5 mmol/L — AB (ref 0.5–2.0)

## 2015-07-04 MED ORDER — SODIUM CHLORIDE 0.9 % IV SOLN
1000.0000 mL | INTRAVENOUS | Status: DC
  Administered 2015-07-04: 1000 mL via INTRAVENOUS

## 2015-07-04 MED ORDER — IOHEXOL 300 MG/ML  SOLN: 25.0000 mL | INTRAMUSCULAR | Status: AC

## 2015-07-04 MED ORDER — SODIUM CHLORIDE 0.9 % IJ SOLN
3.0000 mL | Freq: Two times a day (BID) | INTRAMUSCULAR | Status: DC
  Administered 2015-07-04 – 2015-07-06 (×3): 3 mL via INTRAVENOUS

## 2015-07-04 MED ORDER — LORATADINE 10 MG PO TABS
10.0000 mg | ORAL_TABLET | Freq: Every day | ORAL | Status: DC
  Administered 2015-07-04 – 2015-07-06 (×3): 10 mg via ORAL
  Filled 2015-07-04 (×3): qty 1

## 2015-07-04 MED ORDER — PIPERACILLIN-TAZOBACTAM 3.375 G IVPB
3.3750 g | Freq: Three times a day (TID) | INTRAVENOUS | Status: DC
  Administered 2015-07-04 – 2015-07-06 (×6): 3.375 g via INTRAVENOUS
  Filled 2015-07-04 (×10): qty 50

## 2015-07-04 MED ORDER — RIVAROXABAN 20 MG PO TABS: 20.0000 mg | ORAL_TABLET | Freq: Every day | ORAL | Status: DC

## 2015-07-04 MED ORDER — SODIUM CHLORIDE 0.9 % IV BOLUS (SEPSIS)
1000.0000 mL | Freq: Once | INTRAVENOUS | Status: AC
  Administered 2015-07-04: 1000 mL via INTRAVENOUS

## 2015-07-04 MED ORDER — IOHEXOL 350 MG/ML SOLN
100.0000 mL | Freq: Once | INTRAVENOUS | Status: AC | PRN
  Administered 2015-07-04: 100 mL via INTRAVENOUS

## 2015-07-04 MED ORDER — DILTIAZEM HCL 60 MG PO TABS
60.0000 mg | ORAL_TABLET | Freq: Four times a day (QID) | ORAL | Status: DC
  Administered 2015-07-04 – 2015-07-05 (×4): 60 mg via ORAL
  Filled 2015-07-04 (×8): qty 1

## 2015-07-04 MED ORDER — VANCOMYCIN HCL 10 G IV SOLR
2500.0000 mg | Freq: Once | INTRAVENOUS | Status: AC
  Administered 2015-07-04: 2500 mg via INTRAVENOUS
  Filled 2015-07-04: qty 2500

## 2015-07-04 MED ORDER — ETOMIDATE 2 MG/ML IV SOLN: 30.0000 mg | Freq: Once | INTRAVENOUS | Status: DC

## 2015-07-04 MED ORDER — LORAZEPAM 0.5 MG PO TABS
0.5000 mg | ORAL_TABLET | Freq: Once | ORAL | Status: AC
  Administered 2015-07-04: 0.5 mg via ORAL
  Filled 2015-07-04: qty 1

## 2015-07-04 MED ORDER — ACETAMINOPHEN 325 MG PO TABS
650.0000 mg | ORAL_TABLET | Freq: Once | ORAL | Status: AC
  Administered 2015-07-04: 650 mg via ORAL
  Filled 2015-07-04: qty 2

## 2015-07-04 MED ORDER — SODIUM CHLORIDE 0.9 % IJ SOLN
3.0000 mL | Freq: Two times a day (BID) | INTRAMUSCULAR | Status: DC
  Administered 2015-07-04: 3 mL via INTRAVENOUS

## 2015-07-04 MED ORDER — PIPERACILLIN-TAZOBACTAM 3.375 G IVPB
3.3750 g | Freq: Once | INTRAVENOUS | Status: AC
  Administered 2015-07-04: 3.375 g via INTRAVENOUS
  Filled 2015-07-04: qty 50

## 2015-07-04 MED ORDER — VANCOMYCIN HCL IN DEXTROSE 1-5 GM/200ML-% IV SOLN
INTRAVENOUS | Status: AC
  Filled 2015-07-04: qty 200

## 2015-07-04 MED ORDER — ETOMIDATE 2 MG/ML IV SOLN
INTRAVENOUS | Status: AC
  Filled 2015-07-04: qty 10

## 2015-07-04 MED ORDER — RIVAROXABAN 20 MG PO TABS
20.0000 mg | ORAL_TABLET | Freq: Every day | ORAL | Status: DC
  Administered 2015-07-04 – 2015-07-06 (×3): 20 mg via ORAL
  Filled 2015-07-04 (×3): qty 1

## 2015-07-04 MED ORDER — PHENYLEPHRINE HCL 10 MG/ML IJ SOLN
INTRAMUSCULAR | Status: AC
  Filled 2015-07-04: qty 1

## 2015-07-04 MED ORDER — ACETAMINOPHEN 325 MG PO TABS
650.0000 mg | ORAL_TABLET | Freq: Four times a day (QID) | ORAL | Status: DC | PRN
  Administered 2015-07-04: 650 mg via ORAL
  Filled 2015-07-04: qty 2

## 2015-07-04 MED ORDER — VANCOMYCIN HCL 10 G IV SOLR
1500.0000 mg | Freq: Two times a day (BID) | INTRAVENOUS | Status: DC
  Administered 2015-07-04 – 2015-07-06 (×4): 1500 mg via INTRAVENOUS
  Filled 2015-07-04 (×6): qty 1500

## 2015-07-04 MED ORDER — PHENYLEPHRINE HCL 10 MG/ML IJ SOLN
0.0000 ug/min | INTRAMUSCULAR | Status: DC
  Filled 2015-07-04: qty 1

## 2015-07-04 MED ORDER — DILTIAZEM HCL 100 MG IV SOLR
INTRAVENOUS | Status: AC
  Filled 2015-07-04: qty 100

## 2015-07-04 MED ORDER — ETOMIDATE 2 MG/ML IV SOLN
INTRAVENOUS | Status: AC
Start: 2015-07-04 — End: 2015-07-04
  Filled 2015-07-04: qty 10

## 2015-07-04 NOTE — Consult Note (Signed)
Reason for Consult:chronic abdominal  wound Referring Physician: Virgina Jock MD  Jeremy Rush is an 64 y.o. male.  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  He had onlay repair of VH by Dr Annamaria Boots in 1999.  Seen in 2009 for chronic mesh infection and sees Dr Lucia Gaskins every 6 months for debridement in the office.  He has morbid obesity and Dr Lucia Gaskins discussed losing some weight so that a more formal debridement could be done.  He has gained 9 more lbs.  He comes in today with SOB and feeling poor.  He has had some chills no documented fever.  He has yellow drainage from chronic wound. Sore around the site.   Past Medical History  Diagnosis Date  . Morbid obesity   . Hypertension   . DJD (degenerative joint disease)     Knees  . Atrial fibrillation 2014    Initial diagnosis in 12/2012  . Hyperlipidemia     Past Surgical History  Procedure Laterality Date  . Ventral hernia repair  1990s-2000    X4  . Abdominal debridement  2009    Dr. Alphonsa Overall, infected mesh/abscess  . Wrist surgery      Right    History reviewed. No pertinent family history.  Social History:  reports that he has never smoked. He has never used smokeless tobacco. He reports that he does not drink alcohol or use illicit drugs.  Allergies:  Allergies  Allergen Reactions  . Codeine Other (See Comments)    Mood swing  . Morphine And Related Other (See Comments)    Mood swing    Medications: I have reviewed the patient's current medications.  Results for orders placed or performed during the hospital encounter of 07/04/15 (from the past 48 hour(s))  Comprehensive metabolic panel     Status: Abnormal   Collection Time: 07/04/15  2:40 AM  Result Value Ref Range   Sodium 138 135 - 145 mmol/L   Potassium 3.6 3.5 - 5.1 mmol/L   Chloride 105 101 - 111 mmol/L   CO2 24 22 - 32 mmol/L   Glucose, Bld 103 (H) 65 - 99 mg/dL   BUN 25 (H) 6 - 20 mg/dL   Creatinine, Ser 1.32 (H) 0.61 - 1.24 mg/dL   Calcium 8.4 (L) 8.9 - 10.3 mg/dL   Total Protein 6.8 6.5 - 8.1 g/dL   Albumin 3.5 3.5 - 5.0 g/dL   AST 19 15 - 41 U/L   ALT 12 (L) 17 - 63 U/L   Alkaline Phosphatase 43 38 - 126 U/L   Total Bilirubin 1.1 0.3 - 1.2 mg/dL   GFR calc non Af Amer 56 (L) >60 mL/min   GFR calc Af Amer >60 >60 mL/min    Comment: (NOTE) The eGFR has been calculated using the CKD EPI equation. This calculation has not been validated in all clinical situations. eGFR's persistently <60 mL/min signify possible Chronic Kidney Disease.    Anion gap 9 5 - 15  CBC WITH DIFFERENTIAL     Status: Abnormal   Collection Time: 07/04/15  2:40 AM  Result Value Ref Range   WBC 9.2 4.0 - 10.5 K/uL   RBC 5.16 4.22 - 5.81 MIL/uL   Hemoglobin 13.8 13.0 - 17.0 g/dL   HCT 43.4 39.0 - 52.0 %   MCV 84.1 78.0 - 100.0 fL   MCH 26.7 26.0 - 34.0 pg   MCHC 31.8  30.0 - 36.0 g/dL   RDW 14.8 11.5 - 15.5 %   Platelets 147 (L) 150 - 400 K/uL   Neutrophils Relative % 94 (H) 43 - 77 %   Neutro Abs 8.7 (H) 1.7 - 7.7 K/uL   Lymphocytes Relative 3 (L) 12 - 46 %   Lymphs Abs 0.3 (L) 0.7 - 4.0 K/uL   Monocytes Relative 2 (L) 3 - 12 %   Monocytes Absolute 0.2 0.1 - 1.0 K/uL   Eosinophils Relative 1 0 - 5 %   Eosinophils Absolute 0.1 0.0 - 0.7 K/uL   Basophils Relative 0 0 - 1 %   Basophils Absolute 0.0 0.0 - 0.1 K/uL  Blood Culture (routine x 2)     Status: None (Preliminary result)   Collection Time: 07/04/15  2:40 AM  Result Value Ref Range   Specimen Description LEFT ANTECUBITAL    Special Requests BOTTLES DRAWN AEROBIC ONLY 6CC DRAWN BY RN    Culture NO GROWTH < 12 HOURS    Report Status PENDING   Troponin I     Status: None   Collection Time: 07/04/15  2:40 AM  Result Value Ref Range   Troponin I <0.03 <0.031 ng/mL    Comment:        NO INDICATION OF MYOCARDIAL INJURY.   Brain natriuretic peptide     Status: Abnormal   Collection Time: 07/04/15  2:40 AM  Result Value Ref Range     B Natriuretic Peptide 123.0 (H) 0.0 - 100.0 pg/mL  Protime-INR     Status: Abnormal   Collection Time: 07/04/15  2:40 AM  Result Value Ref Range   Prothrombin Time 20.2 (H) 11.6 - 15.2 seconds   INR 1.72 (H) 0.00 - 1.49  Blood Culture (routine x 2)     Status: None (Preliminary result)   Collection Time: 07/04/15  2:45 AM  Result Value Ref Range   Specimen Description BLOOD RIGHT HAND    Special Requests BOTTLES DRAWN AEROBIC AND ANAEROBIC 6CC    Culture NO GROWTH < 12 HOURS    Report Status PENDING   I-Stat CG4 Lactic Acid, ED  (not at  [facility])     Status: Abnormal   Collection Time: 07/04/15  2:49 AM  Result Value Ref Range   Lactic Acid, Venous 2.23 (HH) 0.5 - 2.0 mmol/L   Comment NOTIFIED PHYSICIAN   Urinalysis, Routine w reflex microscopic (not at [facility])     Status: Abnormal   Collection Time: 07/04/15  7:00 AM  Result Value Ref Range   Color, Urine AMBER (A) YELLOW    Comment: BIOCHEMICALS MAY BE AFFECTED BY COLOR   APPearance CLEAR CLEAR   Specific Gravity, Urine 1.025 1.005 - 1.030   pH 5.5 5.0 - 8.0   Glucose, UA NEGATIVE NEGATIVE mg/dL   Hgb urine dipstick NEGATIVE NEGATIVE   Bilirubin Urine SMALL (A) NEGATIVE   Ketones, ur 15 (A) NEGATIVE mg/dL   Protein, ur NEGATIVE NEGATIVE mg/dL   Urobilinogen, UA 1.0 0.0 - 1.0 mg/dL   Nitrite NEGATIVE NEGATIVE   Leukocytes, UA NEGATIVE NEGATIVE    Comment: MICROSCOPIC NOT DONE ON URINES WITH NEGATIVE PROTEIN, BLOOD, LEUKOCYTES, NITRITE, OR GLUCOSE <1000 mg/dL.  I-Stat CG4 Lactic Acid, ED  (not at  [facility])     Status: Abnormal   Collection Time: 07/04/15  7:46 AM  Result Value Ref Range   Lactic Acid, Venous 2.77 (HH) 0.5 - 2.0 mmol/L   Comment NOTIFIED PHYSICIAN     Dg  Chest Port 1 View  07/04/2015   CLINICAL DATA:  Shortness of breath.  EXAM: PORTABLE CHEST - 1 VIEW  COMPARISON:  One-view chest x-ray 11/10/2014.  FINDINGS: The heart is enlarged. Mild edema is present. No focal airspace consolidation is present.  IMPRESSION:  Cardiomegaly and mild edema.   Electronically Signed   By: San Morelle M.D.   On: 07/04/2015 03:03   Ct Angio Chest Aorta W/cm &/or Wo/cm  07/04/2015   CLINICAL DATA:  Acute onset of generalized body aches and weakness. Chills. Initial encounter.  EXAM: CT ANGIOGRAPHY CHEST WITH CONTRAST  TECHNIQUE: Multidetector CT imaging of the chest was performed using the standard protocol during bolus administration of intravenous contrast. Multiplanar CT image reconstructions and MIPs were obtained to evaluate the vascular anatomy.  CONTRAST:  114m OMNIPAQUE IOHEXOL 350 MG/ML SOLN  COMPARISON:  Chest radiograph performed earlier today at 2:46 a.m., and CT of the chest performed 02/06/2015  FINDINGS: The ascending thoracic aorta measures 5.0 cm in AP dimension. This is stable from the prior study and does not appear unusually prominent for the patient's size. No aortic dissection is seen.  There is no evidence of central pulmonary embolus.  Minimal bilateral atelectasis is noted. The lungs are otherwise clear. There is no evidence of significant focal consolidation, pleural effusion or pneumothorax. No masses are identified; no abnormal focal contrast enhancement is seen.  The mediastinum is unremarkable in appearance. No mediastinal lymphadenopathy is seen. No pericardial effusion is identified. The great vessels are grossly unremarkable in appearance. No axillary lymphadenopathy is seen. The visualized portions of the thyroid gland are unremarkable in appearance.  The visualized portions of the liver and spleen are unremarkable. The visualized portions of the pancreas, gallbladder, stomach, adrenal glands and kidneys are within normal limits.  No acute osseous abnormalities are seen.  Review of the MIP images confirms the above findings.  IMPRESSION: 1. No evidence of aortic dissection. 2. No evidence of central pulmonary embolus. 3. Ascending thoracic aorta measures 5.0 cm in AP dimension. This is stable from  the prior study and does not appear unusually prominent for the patient's size. 4. Minimal bilateral atelectasis noted.  Lungs otherwise clear.   Electronically Signed   By: JGarald BaldingM.D.   On: 07/04/2015 05:34    Review of Systems  Constitutional: Positive for chills and malaise/fatigue.  Respiratory: Positive for shortness of breath.   Cardiovascular: Negative for chest pain.  Gastrointestinal: Negative.   Skin: Negative.   Neurological: Negative.   Psychiatric/Behavioral: Negative.    Blood pressure 116/78, pulse 95, temperature 97.4 F (36.3 C), temperature source Oral, resp. rate 37, height _0  (1.727 m), weight 185.657 kg (409 lb 4.8 oz), SpO2 96 %. Physical Exam  Constitutional: He is oriented to person, place, and time.  Eyes: Pupils are equal, round, and reactive to light. No scleral icterus.  Neck: Normal range of motion.  Respiratory: Tachypnea noted. He has wheezes.  GI: He exhibits no distension and no fluid wave. There is no tenderness. No hernia.    Neurological: He is alert and oriented to person, place, and time.  Skin: He is diaphoretic.    Assessment/Plan: Chronic abdominal wound from hernia repiar 1999 and chronic mesh that is debrided by Dr NLucia Gaskinsin the office every 6 months Patient Active Problem List   Diagnosis Date Noted  . A-fib 07/04/2015  . Thoracic aortic aneurysm 02/19/2015  . CHF (congestive heart failure) 05/08/2013  . Chronic anticoagulation 01/17/2013  . Hypertension   .  Atrial fibrillation   . Wound infection, chronic anterior abdominal abscess. 09/22/2011  . Morbid obesity 09/22/2011    Recommend antibiotics Need CT A/P when able Cover with dry dressing Will follow   Tayjon Halladay A. 07/04/2015, 12:43 PM

## 2015-07-04 NOTE — ED Notes (Signed)
Pt taken to CT and returned back to room; pt repositioned in bed and hooked back to monitors

## 2015-07-04 NOTE — Progress Notes (Signed)
Lab called to Md of Lactic acid 2.5. Down from 2.77.  Pt restless.  Given prn tylenol earlier per pt's request.  No prn sleep med ordered.  Will continue to monitor. Karena Addison T

## 2015-07-04 NOTE — ED Notes (Signed)
Dr. Rhunette Croft in with pt at this time explaining cardioversion procedure; pt asking questions and hesitant to sign consent, pt signed consent

## 2015-07-04 NOTE — ED Notes (Signed)
Report given to Robyne Peers at [facility] ED

## 2015-07-04 NOTE — Progress Notes (Addendum)
ANTIBIOTIC CONSULT NOTE-Initial Consult  Pharmacy Consult for Vancomycin & Zosyn Indication: Sepsis  Allergies  Allergen Reactions  . Codeine Other (See Comments)    Mood swing  . Morphine And Related Other (See Comments)    Mood swing    Patient Measurements: Height:  (172.7 cm) Weight: (!) 390 lb (176.903 kg) IBW/kg (Calculated) : 68.4 kg   Vital Signs: Temp: 100.7 F (38.2 C) (08/13 0201) Temp Source: Oral (08/13 0201) BP: 91/64 mmHg (08/13 0300) Pulse Rate: 176 (08/13 0300)  Labs:  Recent Labs  07/04/15 0240  WBC 9.2  HGB 13.8  PLT 147*    CrCl cannot be calculated (Patient has no serum creatinine result on file.).     Microbiology: Recent Results (from the past 720 hour(s))  Blood Culture (routine x 2)     Status: None (Preliminary result)   Collection Time: 07/04/15  2:40 AM  Result Value Ref Range Status   Specimen Description LEFT ANTECUBITAL  Final   Special Requests BOTTLES DRAWN AEROBIC ONLY 6CC DRAWN BY RN  Final   Culture PENDING  Incomplete   Report Status PENDING  Incomplete  Blood Culture (routine x 2)     Status: None (Preliminary result)   Collection Time: 07/04/15  2:45 AM  Result Value Ref Range Status   Specimen Description BLOOD RIGHT HAND  Final   Special Requests BOTTLES DRAWN AEROBIC AND ANAEROBIC 6CC  Final   Culture PENDING  Incomplete   Report Status PENDING  Incomplete    Medical History: Past Medical History  Diagnosis Date  . Morbid obesity   . Hypertension   . DJD (degenerative joint disease)     Knees  . Atrial fibrillation 2014    Initial diagnosis in 12/2012  . Hyperlipidemia     Medications:  Anti-infectives    Start     Dose/Rate Route Frequency Ordered Stop   07/04/15 0330  vancomycin (VANCOCIN) 2,500 mg in sodium chloride 0.9 % 500 mL IVPB     2,500 mg 250 mL/hr over 120 Minutes Intravenous  Once 07/04/15 0319     07/04/15 0330  piperacillin-tazobactam (ZOSYN) IVPB 3.375 g     3.375 g 12.5 mL/hr  over 240 Minutes Intravenous  Once 07/04/15 0319        Assessment: 64 yo morbidly obese male with suspected sepsis.  Labs pending  Goal of Therapy:  Vancomycin trough level 15-20 mcg/ml  Plan:  Preliminary review of pertinent patient information completed.  Protocol will be initiated with one-time doses of Vancomycin 2500 mg IV and Zosyn 3.375grams.  Jeani Hawking clinical pharmacist will complete review during morning rounds to assess patient and finalize treatment regimen.  Natasha Bence, [facility] 07/04/2015,3:22 AM  Addendum: Patient has now been transferred to [facility] for further workup. CMET has resulted and he does have slight renal insufficiency with sCr 1.32, CrCl ~ 5ml/min but overall stable. First doses given at [facility]  Plan: 1) Continue with vancomycin  IV q12 2) Continue with zosyn 3.375g IV q8 (4 hour infusion) 3) Follow renal function, cultures, LOT, level if needed   Thomasena Edis, BCPS 07/04/2015, 2:07 PM

## 2015-07-04 NOTE — ED Provider Notes (Addendum)
CSN: [id number]     Arrival date & time 07/04/15  0148 History   First MD Initiated Contact with Patient 07/04/15 0202     Chief Complaint  Patient presents with  . Generalized Body Aches     (Consider location/radiation/quality/duration/timing/severity/associated sxs/prior Treatment) HPI Comments: Pt comes in with cc of weakness. At 9 pm pt started feeling that his legs were getting heavy, he felt clammy. Pt laid down and rested, but continued to feel weak and started having chills, so he called EMS. ROS is + for some sinus congestion, mild dry cough, + shortness of breath. Pt denies nausea, emesis, chest pains, headaches, abdominal pain, uti like symptoms, no rash. No hx of PE, DVT. Pt is morbidly obese, but active.  Pt has hx of atrial fibrillation, history of aortic root enlargement, hypertension, with reduced RV systolic fx, thoracic aorta aneurysm.  ROS 10 Systems reviewed and are negative for acute change except as noted in the HPI.      The history is provided by the patient and medical records.    Past Medical History  Diagnosis Date  . Morbid obesity   . Hypertension   . DJD (degenerative joint disease)     Knees  . Atrial fibrillation 2014    Initial diagnosis in 12/2012  . Hyperlipidemia    Past Surgical History  Procedure Laterality Date  . Ventral hernia repair  1990s-2000    X4  . Abdominal debridement  2009    Dr. Ovidio Kin, infected mesh/abscess  . Wrist surgery      Right   History reviewed. No pertinent family history. Social History  Substance Use Topics  . Smoking status: Never Smoker   . Smokeless tobacco: Never Used  . Alcohol Use: No    Review of Systems  Constitutional: Positive for fever, chills and activity change.  Gastrointestinal: Positive for nausea and abdominal pain.  Skin: Positive for rash.  All other systems reviewed and are negative.     Allergies  Codeine and Morphine and related  Home Medications   Prior to  Admission medications   Medication Sig Start Date End Date Taking? Authorizing Provider  acetaminophen (TYLENOL) 500 MG tablet Take 500 mg by mouth every 6 (six) hours as needed for pain.   Yes Historical Provider, MD  diltiazem (CARDIZEM LA) 240 MG 24 hr tablet Take 1 tablet (240 mg total) by mouth daily. TAKE 1 TABLET BY MOUTH EVERY DAY 02/05/15  Yes Laqueta Linden, MD  furosemide (LASIX) 20 MG tablet Take 1 tablet daily 02/25/15  Yes Laqueta Linden, MD  lisinopril (PRINIVIL,ZESTRIL) 10 MG tablet TAKE 1 TABLET (10 MG TOTAL) BY MOUTH DAILY. 06/22/15  Yes Jodelle Gross, NP  loratadine (CLARITIN) 10 MG tablet TAKE 1 TABLET BY MOUTH DAILY 01/12/15  Yes Jodelle Gross, NP  rivaroxaban (XARELTO) 20 MG TABS tablet TAKE 1 TABLET (20 MG TOTAL) BY MOUTH DAILY. 01/13/15  Yes Laqueta Linden, MD   BP 110/70 mmHg  Pulse 53  Temp(Src) 97.4 F (36.3 C) (Oral)  Resp 29  Ht 5\' 8"  (1.727 m)  Wt 390 lb (176.903 kg)  BMI 59.31 kg/m2  SpO2 100% Physical Exam  Constitutional: He is oriented to person, place, and time. He appears well-developed.  HENT:  Head: Normocephalic and atraumatic.  Eyes: Conjunctivae and EOM are normal. Pupils are equal, round, and reactive to light.  Neck: Normal range of motion. Neck supple.  Cardiovascular: Intact distal pulses.   Tachycardia, irregularly irregular  Pulmonary/Chest: Effort normal and breath sounds normal. No respiratory distress.  Poor exam due to poor body habitus, but no wheezing appreciated  Abdominal: Soft. Bowel sounds are normal. He exhibits no distension. There is tenderness. There is no rebound and no guarding.  RUQ tenderness. Pt has a large erythematous plaque on his abdomen with a surgical wound site in between that is covered up and has some foul smell to it. No purulence appreciated.  Musculoskeletal: He exhibits edema.  1+ pitting edema bilaterally  Neurological: He is alert and oriented to person, place, and time.  Skin: Skin is  warm.  Nursing note and vitals reviewed.   ED Course  CARDIOVERSION Date/Time: 07/04/2015 5:19 AM Performed by: Derwood Kaplan Authorized by: Derwood Kaplan Consent: The procedure was performed in an emergent situation. Written consent obtained. Risks and benefits: risks, benefits and alternatives were discussed Consent given by: patient Patient understanding: patient states understanding of the procedure being performed Patient consent: the patient's understanding of the procedure matches consent given Procedure consent: procedure consent matches procedure scheduled Relevant documents: relevant documents present and verified Imaging studies: imaging studies available Patient identity confirmed: arm band Time out: Immediately prior to procedure a "time out" was called to verify the correct patient, procedure, equipment, support staff and site/side marked as required. Patient sedated: yes Sedation type: moderate (conscious) sedation Sedatives: etomidate Sedation start date/time: 07/04/2015 5:17 AM Sedation end date/time: 07/04/2015 5:50 AM Cardioversion basis: emergent Pre-procedure rhythm: atrial fibrillation Patient position: patient was placed in a supine position Chest area: chest area exposed Electrodes: pads Electrodes placed: anterior-posterior Number of attempts: 2 Attempt 1 mode: synchronous Attempt 1 shock (in Joules): 200 Attempt 1 outcome: no change in rhythm Attempt 2 mode: synchronous Attempt 2 shock (in Joules): 200 Attempt 2 outcome: no change in rhythm Post-procedure rhythm: atrial fibrillation Complications: no complications Patient tolerance: Patient tolerated the procedure well with no immediate complications Comments: Pt aox3, moving all 4 post procedure, no neurologic complains.   Procedural sedation Performed by: Derwood Kaplan Consent: Verbal consent obtained. Risks and benefits: risks, benefits and alternatives were discussed Required items:  required blood products, implants, devices, and special equipment available Patient identity confirmed: arm band and provided demographic data Time out: Immediately prior to procedure a "time out" was called to verify the correct patient, procedure, equipment, support staff and site/side marked as required.  Sedation type: moderate (conscious) sedation NPO time confirmed and considedered  Sedatives: ETOMIDATE  Physician Time at Bedside: 33 minutes  Vitals: Vital signs were monitored during sedation. Cardiac Monitor, pulse oximeter Patient tolerance: Patient tolerated the procedure well with no immediate complications. Comments: Pt with uneventful recovered. Returned to pre-procedural sedation baseline    (including critical care time)  Labs Review Labs Reviewed  COMPREHENSIVE METABOLIC PANEL - Abnormal; Notable for the following:    Glucose, Bld 103 (*)    BUN 25 (*)    Creatinine, Ser 1.32 (*)    Calcium 8.4 (*)    ALT 12 (*)    GFR calc non Af Amer 56 (*)    All other components within normal limits  CBC WITH DIFFERENTIAL/PLATELET - Abnormal; Notable for the following:    Platelets 147 (*)    Neutrophils Relative % 94 (*)    Neutro Abs 8.7 (*)    Lymphocytes Relative 3 (*)    Lymphs Abs 0.3 (*)    Monocytes Relative 2 (*)    All other components within normal limits  BRAIN NATRIURETIC PEPTIDE - Abnormal; Notable  for the following:    B Natriuretic Peptide 123.0 (*)    All other components within normal limits  PROTIME-INR - Abnormal; Notable for the following:    Prothrombin Time 20.2 (*)    INR 1.72 (*)    All other components within normal limits  I-STAT CG4 LACTIC ACID, ED - Abnormal; Notable for the following:    Lactic Acid, Venous 2.23 (*)    All other components within normal limits  CULTURE, BLOOD (ROUTINE X 2)  CULTURE, BLOOD (ROUTINE X 2)  URINE CULTURE  TROPONIN I  URINALYSIS, ROUTINE W REFLEX MICROSCOPIC (NOT AT [facility])  CALCITONIN  I-STAT CG4 LACTIC  ACID, ED    Imaging Review Dg Chest Port 1 View  07/04/2015   CLINICAL DATA:  Shortness of breath.  EXAM: PORTABLE CHEST - 1 VIEW  COMPARISON:  One-view chest x-ray 11/10/2014.  FINDINGS: The heart is enlarged. Mild edema is present. No focal airspace consolidation is present.  IMPRESSION: Cardiomegaly and mild edema.   Electronically Signed   By: Marin Roberts M.D.   On: 07/04/2015 03:03   Ct Angio Chest Aorta W/cm &/or Wo/cm  07/04/2015   CLINICAL DATA:  Acute onset of generalized body aches and weakness. Chills. Initial encounter.  EXAM: CT ANGIOGRAPHY CHEST WITH CONTRAST  TECHNIQUE: Multidetector CT imaging of the chest was performed using the standard protocol during bolus administration of intravenous contrast. Multiplanar CT image reconstructions and MIPs were obtained to evaluate the vascular anatomy.  CONTRAST:  OMNIPAQUE IOHEXOL 350 MG/ML SOLN  COMPARISON:  Chest radiograph performed earlier today at 2:46 a.m., and CT of the chest performed 02/06/2015  FINDINGS: The ascending thoracic aorta measures 5.0 cm in AP dimension. This is stable from the prior study and does not appear unusually prominent for the patient's size. No aortic dissection is seen.  There is no evidence of central pulmonary embolus.  Minimal bilateral atelectasis is noted. The lungs are otherwise clear. There is no evidence of significant focal consolidation, pleural effusion or pneumothorax. No masses are identified; no abnormal focal contrast enhancement is seen.  The mediastinum is unremarkable in appearance. No mediastinal lymphadenopathy is seen. No pericardial effusion is identified. The great vessels are grossly unremarkable in appearance. No axillary lymphadenopathy is seen. The visualized portions of the thyroid gland are unremarkable in appearance.  The visualized portions of the liver and spleen are unremarkable. The visualized portions of the pancreas, gallbladder, stomach, adrenal glands and kidneys are  within normal limits.  No acute osseous abnormalities are seen.  Review of the MIP images confirms the above findings.  IMPRESSION: 1. No evidence of aortic dissection. 2. No evidence of central pulmonary embolus. 3. Ascending thoracic aorta measures 5.0 cm in AP dimension. This is stable from the prior study and does not appear unusually prominent for the patient's size. 4. Minimal bilateral atelectasis noted.  Lungs otherwise clear.   Electronically Signed   By: Roanna Raider M.D.   On: 07/04/2015 05:34   I, Rhunette Croft, Zephan Beauchaine, personally reviewed and evaluated these images and lab results as part of my medical decision-making.   EKG Interpretation   Date/Time:  Saturday July 04 2015 02:28:16 EDT Ventricular Rate:  190 PR Interval:  108 QRS Duration: 106 QT Interval:  292 QTC Calculation: 519 R Axis:   119 Text Interpretation:  Atrial fibrillation with RVR Supraventricular  tachycardia Probable right ventricular hypertrophy Confirmed by Rhunette Croft,  MD, Salote Weidmann ([id number]) on 07/04/2015 2:32:28 AM     EKG Interpretation  Date/Time:  Saturday July 04 2015 05:25:09 EDT Ventricular Rate:  124 PR Interval:  108 QRS Duration: 113 QT Interval:  326 QTC Calculation: 468 R Axis:   93 Text Interpretation:  Atrial fibrillation Incomplete right bundle branch block Low voltage, extremity and precordial leads Confirmed by Rhunette Croft, MD, Janey Genta [phone]) on 07/04/2015 5:47:38 AM       CRITICAL CARE Performed by: Derwood Kaplan   Total critical care time: 90 minutes  Critical care time was exclusive of separately billable procedures and treating other patients.  Critical care was necessary to treat or prevent imminent or life-threatening deterioration.  Critical care was time spent personally by me on the following activities: development of treatment plan with patient and/or surrogate as well as nursing, discussions with consultants, evaluation of patient's response to treatment, examination of  patient, obtaining history from patient or surrogate, ordering and performing treatments and interventions, ordering and review of laboratory studies, ordering and review of radiographic studies, pulse oximetry and re-evaluation of patient's condition.     MDM   Final diagnoses:  Atrial fibrillation with RVR  Severe sepsis     Morbidly obese pt comes in with cc of generalized weakness. He is noted to be febrile, tachycardic, slightly tachypneic with no resp distress or hypoxic resp failure. Hx not revealing of specific source of infection - but he is tachypneic and has the abdominal lesion - which he reports is unchanged. He had RUQ tenderness as well - though he has no pain over there at rest.  Pt is in afib with RVR. Given the fever, it is quite possible that he decompensated due to infection sepsis. He has CHF - EF 45%. We will start hydration, give antipyretic, and reassess. Cardiac pads placed. Holding diltiazem for now, until he gets adequate hydration. PE is also on the ddx - but given he takes xarelto, it is lower on the differential. He has thoracic aorta aneurysm. Pulse is equal, no cold extremity, no chest pains.  Will start diltiazem after some fluid resuscitation - his BP is very thready currently, 95-102 SBP.  Fevers: Pt has 3/4 SIRs at arrival and is in resp distress and decompensated afib - so likely in severe sepsis. Will get sepsis workup started. Likely to need CT scan of the abdomen.  :45 am - s/p 1 liters + fluid. BP has dropped to 80s SBP. PT is still aox3. No room for diltiazem. Pt is reluctant to get electric cardioversion, will have detailed conversation. No room for propofol for the cardioversion. Cards consulted. Will also start phenylephrine drip now.   am: Pt still hesitant with the electric cardioversion, so we started phenylephrine, switched the BP cuff, and i spoke with Cardiologist, Dr. Sullivan Lone to see if there were any other alternatives she recommends,  and if we proceed with electric cardioversion and it fails due to underlying cause - what options we had. Besides digoxin she doesn't think any of the other meds would be helpful, but she thinks electric cardioversion would be the best option, given the fluid resuscitations hasn't helped. CT angio ordered - to ensure the aneurysm is stable and to r/o PE. Fluids continued.  :00: Pt agrees to electric cardioversion if the BP doesn't respond with ivf. He is not appearing floridly septic, and so CT PE was done (he is in rvr, labored breathing w/ clear cxr), and i accompanied pt for a part of it to ensure he was stable. They were unable to get CT abd due to body  habitus. The cuff were not fitting him well, so we have applied BP cuff to his wrist - and the BP are better.  Pt signed the consent for the procedure. The risk, benefits and alternatives discussed. Pt aware that he is at risk of strokes, embolic event and cardiac and respiratory arrest. He is requesting medicine for the procedure and anxiety - we shall use etomidate - as it wont interfere with the BP much, and will sedate him to the point where the procedure can be done safely.  @5 :35: Pt given etomidate 10 mg initially, with no effects - so another 10 mg given. Airway equipment available. etco2 placed. Adequate sedation achieved with etomidate and cardioversion attempted x 2. After the 1st attempt HR improved to 120-130s, but he was still in afib. After waiting a couple of minutes, we got the 2nd attempt at synchronized cardioversion with 200 J again - and again there was no conversion to sinus - HR remains in the 110-120s rate. BP improved to 107/65. Spoke with Dr. Avel Peace, Cardiology again - she requests ER to ER transfer, Cards can take over once patient gets to the ER. Estevan Ryder, EDP informed and accepting at [facility].  110/70  128  With 100% O2 sats at EMS arrival. Pt is aox3, moving all 4, no neuro complains.  Derwood Kaplan, MD 07/04/15  [id number]  Derwood Kaplan, MD 07/04/15 [id number]  Derwood Kaplan, MD 07/04/15 [phone]

## 2015-07-04 NOTE — H&P (Signed)
Date: 07/04/2015               Patient Name:  Jeremy Rush MRN: [id number]  DOB: 1951-02-09 Age / Sex: 64 y.o., male   PCP: No Pcp Per Patient         Medical Service: Internal Medicine Teaching Service         Attending Physician: Dr. Doneen Poisson, MD    First Contact: Carolynn Comment MS4 Pager: [phone]  Second Contact: Dr. Evelena Peat Pager: [phone]       After Hours (After 5p/  First Contact Pager: [phone]  weekends / holidays): Second Contact Pager: [phone]   Chief Complaint: feeling unwell last night with F/C  History of Present Illness: Mr. 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  He said symptoms started suddenly last night and he was unable to sleep.  He denies dyspnea, PND, orthopnea or chest pain and reports compliance with all medications.  He has a chronic non-healing abdominal wound since mesh hernia repair in 1999.  He says the wound usually drains blood, clear fluid and sometimes white discharge.  He changes the dressing twice per day and follows up with surgery for debridement every 6 months.    When he arrived at [facility] this AM he was in Afib with RVR and febrile to 100.27F.  Diltiazem was started but stopped due to hypotension.  Electrical cardioversion was attempted x 2 but did not convert to NSR; it did decrease the rate.  He was subsequently transferred to [facility].  Upon arrival he was hypotensive and still in Afib with RVR with rates into the 170s.  He had an elevated lactic acid 2.23 and soft BPs and broad spectrum abx were started given concern for infection/SIRS.   Meds: Current Facility-Administered Medications  Medication Dose Route Frequency Provider Last Rate Last Dose  . 0.9 %  sodium chloride infusion  1,000 mL Intravenous Continuous Derwood Kaplan, MD 125 mL/hr at  07/04/15 0302 1,000 mL at 07/04/15 0302  . dextrose 5 % with diltiazem (CARDIZEM) ADS Med           . etomidate (AMIDATE) 2 MG/ML injection           . etomidate (AMIDATE) 2 MG/ML injection           . etomidate (AMIDATE) injection 30 mg  30 mg Intravenous Once Ankit Nanavati, MD      . phenylephrine (NEO-SYNEPHRINE) 10 mg in dextrose 5 % 250 mL (0.04 mg/mL) infusion  0-400 mcg/min Intravenous Titrated Derwood Kaplan, MD   Stopped at 07/04/15 0540   Current Outpatient Prescriptions  Medication Sig Dispense Refill  . acetaminophen (TYLENOL) 500 MG tablet Take 500 mg by mouth every 6 (six) hours as needed for pain.    Marland Kitchen diltiazem (CARDIZEM LA) 240 MG 24 hr tablet Take 1 tablet (240 mg total) by mouth daily. TAKE 1 TABLET BY MOUTH EVERY DAY 30 tablet 6  . furosemide (LASIX) 20 MG tablet Take 1 tablet daily 30 tablet 6  . lisinopril (PRINIVIL,ZESTRIL) 10 MG tablet TAKE 1 TABLET (10 MG TOTAL) BY MOUTH DAILY. 90 tablet 2  . loratadine (CLARITIN) 10 MG tablet TAKE 1 TABLET BY MOUTH DAILY 30 tablet 3  . rivaroxaban (XARELTO) 20 MG TABS tablet TAKE 1 TABLET (20  MG TOTAL) BY MOUTH DAILY. 30 tablet 6    Allergies: Allergies as of 07/04/2015 - Review Complete 07/04/2015  Allergen Reaction Noted  . Codeine Other (See Comments) 09/21/2011  . Morphine and related Other (See Comments) 09/21/2011   Past Medical History  Diagnosis Date  . Morbid obesity   . Hypertension   . DJD (degenerative joint disease)     Knees  . Atrial fibrillation 2014    Initial diagnosis in 12/2012  . Hyperlipidemia    Past Surgical History  Procedure Laterality Date  . Ventral hernia repair  1990s-2000    X4  . Abdominal debridement  2009    Dr. Ovidio Kin, infected mesh/abscess  . Wrist surgery      Right   History reviewed. No pertinent family history. Social History   Social History  . Marital Status: Single    Spouse Name: N/A  . Number of Children: N/A  . Years of Education: N/A   Occupational  History  . Disabled    Social History Main Topics  . Smoking status: Never Smoker   . Smokeless tobacco: Never Used  . Alcohol Use: No  . Drug Use: No  . Sexual Activity: Not on file   Other Topics Concern  . Not on file   Social History Narrative   Lives alone with family nearby    Review of Systems: General: Denies weight loss; + chills Cards:  Denies CP, PND, orthopnea Pulmonology: Denies DOE, cough GI:  Denies N/V, abdominal pain, diarrhea GU:  Denies dysuria, polyuria, dark urnie Neuro: Denies weakness  Physical Exam: Blood pressure 95/41, pulse 89, temperature 97.4 F (36.3 C), temperature source Oral, resp. rate 21, height 5\' 8"  (1.727 m), weight 409 lb 4.8 oz (185.657 kg), SpO2 96 %. General: resting in bed in NAD, laying flat on back HEENT: PERRL, EOMI, no scleral icterus, no neck pain, normal neck ROM Cardiac: irregularly irregular, no rubs, murmurs or gallops Pulm:  difficult lung exam due to body habitus, few wheezes but no rales appreciated, no accessory muscle use or respiratory distress Abd: large abdominal girth, soft, nondistended, nontender, BS present, 9x4cm erythematous wound with small opening with small amount of drainage, there was large amount of light green drainage on the gauze covering the wound, directly above the wound was a large red plaque like rash Ext: warm and well perfused, 1+ pedal edema (R>L), venous stasis skin changes Neuro: alert and oriented X3, cranial nerves II-XII grossly intact, MAE spontaneously able to ambulate independently  Lab results: Basic Metabolic Panel:  Recent Labs  16/10/96 0240  NA 138  K 3.6  CL 105  CO2 24  GLUCOSE 103*  BUN 25*  CREATININE 1.32*  CALCIUM 8.4*   Liver Function Tests:  Recent Labs  07/04/15 0240  AST 19  ALT 12*  ALKPHOS 43  BILITOT 1.1  PROT 6.8  ALBUMIN 3.5   CBC:  Recent Labs  07/04/15 0240  WBC 9.2  NEUTROABS 8.7*  HGB 13.8  HCT 43.4  MCV 84.1  PLT 147*   Cardiac  Enzymes:  Recent Labs  07/04/15 0240  TROPONINI <0.03   BNP:  Coagulation:  Recent Labs  07/04/15 0240  LABPROT 20.2*  INR 1.72*   Urine Drug Screen:   Urinalysis:  Recent Labs  07/04/15 0700  COLORURINE AMBER*  LABSPEC 1.025  PHURINE 5.5  GLUCOSEU NEGATIVE  HGBUR NEGATIVE  BILIRUBINUR SMALL*  KETONESUR 15*  PROTEINUR NEGATIVE  UROBILINOGEN 1.0  NITRITE NEGATIVE  LEUKOCYTESUR  NEGATIVE    Imaging results:  Dg Chest Port 1 View  07/04/2015   CLINICAL DATA:  Shortness of breath.  EXAM: PORTABLE CHEST - 1 VIEW  COMPARISON:  One-view chest x-ray 11/10/2014.  FINDINGS: The heart is enlarged. Mild edema is present. No focal airspace consolidation is present.  IMPRESSION: Cardiomegaly and mild edema.   Electronically Signed   By: Marin Roberts M.D.   On: 07/04/2015 03:03   Ct Angio Chest Aorta W/cm &/or Wo/cm  07/04/2015   CLINICAL DATA:  Acute onset of generalized body aches and weakness. Chills. Initial encounter.  EXAM: CT ANGIOGRAPHY CHEST WITH CONTRAST  TECHNIQUE: Multidetector CT imaging of the chest was performed using the standard protocol during bolus administration of intravenous contrast. Multiplanar CT image reconstructions and MIPs were obtained to evaluate the vascular anatomy.  CONTRAST:  OMNIPAQUE IOHEXOL 350 MG/ML SOLN  COMPARISON:  Chest radiograph performed earlier today at 2:46 a.m., and CT of the chest performed 02/06/2015  FINDINGS: The ascending thoracic aorta measures 5.0 cm in AP dimension. This is stable from the prior study and does not appear unusually prominent for the patient's size. No aortic dissection is seen.  There is no evidence of central pulmonary embolus.  Minimal bilateral atelectasis is noted. The lungs are otherwise clear. There is no evidence of significant focal consolidation, pleural effusion or pneumothorax. No masses are identified; no abnormal focal contrast enhancement is seen.  The mediastinum is unremarkable in  appearance. No mediastinal lymphadenopathy is seen. No pericardial effusion is identified. The great vessels are grossly unremarkable in appearance. No axillary lymphadenopathy is seen. The visualized portions of the thyroid gland are unremarkable in appearance.  The visualized portions of the liver and spleen are unremarkable. The visualized portions of the pancreas, gallbladder, stomach, adrenal glands and kidneys are within normal limits.  No acute osseous abnormalities are seen.  Review of the MIP images confirms the above findings.  IMPRESSION: 1. No evidence of aortic dissection. 2. No evidence of central pulmonary embolus. 3. Ascending thoracic aorta measures 5.0 cm in AP dimension. This is stable from the prior study and does not appear unusually prominent for the patient's size. 4. Minimal bilateral atelectasis noted.  Lungs otherwise clear.   Electronically Signed   By: Roanna Raider M.D.   On: 07/04/2015 05:34    Other results: EKG: atrial fibrillation with RVR, rate 141, prolonged QT.  Assessment & Plan by Problem: SIRS:  No leukocytosis but he has a mild temp elevation, tachycardic (Afib RVR) and elevated lactic acid with abdominal mesh infection as possible source.  CXR and UA not c/w infection.  Surgery was consulted given hx of non-healing wound and chronic mesh infection.  Appreciate surgery recommendations. - admit to SDU - continue fluids overnight (careful to watch for pulm edema but no HF symptoms currently and most recent EF 45-50%) - blood and urine cultures pending - continue vanc/zosyn  - trend WBC/fever curve/lactic acid - CT A/P w/o contrast ordered to assess for abscess (patient already received contrast for CTA chest today and radiology recommended avoiding excess IV contrast today and using oral contrast only); however, the patient has refused to go for CT because he has had bad experienced in the CT scanner; we offered anti-anxiety med but he still refuses; will reassess  in the AM  A-fib:  S/p electrical cardioversion x 2 today.  Reports compliance with Xarelto and Cardizem.  RVR likely due to infection.  HR have improved to 80-90s. Cardiology recommendations  appreciated. - continue po dilt q6h (held off on gtt since rates came down and BP had been soft); hopefully rates continue to be controlled with treatment of infection; can consider amio if needed - continue Xarelto for A/C (CHADSVaSc is 2)  RV systolic dysfunction:  No HF symptoms.  CXR reads mild edema, but patient is laying flat on bed, denies orthopnea, PND. - holding home Lasix since BP low-normal and patient is getting fluids; continue IV fluids with careful monitoring of respiratory status   Dispo: Disposition is deferred at this time, awaiting improvement of current medical problems. Anticipated discharge in approximately 2-3 day(s).   The patient does not have a current PCP (No Pcp Per Patient) and does not need an [facility] hospital follow-up appointment after discharge.  The patient does not know have transportation limitations that hinder transportation to clinic appointments.  Signed: Yolanda Manges, DO 07/04/2015, 11:19 AM

## 2015-07-04 NOTE — ED Notes (Signed)
Pt brought in by [facility] for c/o generalized body aches and weakness that started earlier this evening; pt states he has been having chills

## 2015-07-04 NOTE — ED Notes (Signed)
Admitting MD at bedside.

## 2015-07-04 NOTE — ED Notes (Signed)
Pt brought to ED by EMS from [facility] ED for admission for sepsis and A fib evaluation, pt denies any CP or SOB at this time.

## 2015-07-04 NOTE — ED Notes (Signed)
Pt administered etomidate  at 0520 and cardioverted with 200 joules with no success in decrease in heartrate; pt given  etomidate at 0522 and cardioverted with 200 joules and heartrate decreased to 110's to 120's; non-rebreather mask applied and pt maintaining sats in the upper 90's to 100% O2

## 2015-07-04 NOTE — ED Notes (Signed)
Defibrillator pads placed on pt; seizure pad placed behind pt for comfort

## 2015-07-04 NOTE — ED Notes (Signed)
Pt assisted to bathroom and back to bed; pulse ox walking back to bed is 93% on RA, HR 190's; pt has very labored breathing after walk back from bathroom, Dr. Rhunette Croft notified

## 2015-07-04 NOTE — Consult Note (Signed)
Admit date: 07/04/2015 Referring Physician  Dr. Shyrl Numbers Primary Physician No PCP Per Patient Primary Cardiologist  Dr. Purvis Sheffield Reason for Consultation  AFIB with RVR  HPI: 64 year old male with morbid obesity, aortic root enlargement, essential hypertension, normal left ventricular systolic function with reduced RV systolic function on recent echocardiogram who was transferred from [facility] with atrial fibrillation, rapid ventricular response and possible sepsis.  He complained of generalized weakness and was noted to be febrile, tachycardic, slightly tachypnea, abdominal lesion which he reports unchanged with right upper quadrant tenderness as well. Last night he states that he just could not get to sleep. When asked to further clarify this, he states that something was just not right. Denies any recent diarrhea.  Diltiazem as well as fluid resuscitation was started and his blood pressures were 90s to 100 systolic. Blood pressure then began to drop and after long discussion, he underwent cardioversion which was unsuccessful however decreased his overall heart rate.  CT scan was attempted but they were unable to get because of body habitus.  He was started on vancomycin and Zosyn in the emergency room.  CHADS VASC Score of 3.    PMH:   Past Medical History  Diagnosis Date  . Morbid obesity   . Hypertension   . DJD (degenerative joint disease)     Knees  . Atrial fibrillation 2014    Initial diagnosis in 12/2012  . Hyperlipidemia     PSH:   Past Surgical History  Procedure Laterality Date  . Ventral hernia repair  1990s-2000    X4  . Abdominal debridement  2009    Dr. Ovidio Kin, infected mesh/abscess  . Wrist surgery      Right   Allergies:  Codeine and Morphine and related Prior to Admit Meds:   Prior to Admission medications   Medication Sig Start Date End Date Taking? Authorizing Provider  acetaminophen (TYLENOL) 500 MG tablet Take 500 mg by mouth  every 6 (six) hours as needed for pain.   Yes Historical Provider, MD  diltiazem (CARDIZEM LA) 240 MG 24 hr tablet Take 1 tablet (240 mg total) by mouth daily. TAKE 1 TABLET BY MOUTH EVERY DAY 02/05/15  Yes Laqueta Linden, MD  furosemide (LASIX) 20 MG tablet Take 1 tablet daily 02/25/15  Yes Laqueta Linden, MD  lisinopril (PRINIVIL,ZESTRIL) 10 MG tablet TAKE 1 TABLET (10 MG TOTAL) BY MOUTH DAILY. 06/22/15  Yes Jodelle Gross, NP  loratadine (CLARITIN) 10 MG tablet TAKE 1 TABLET BY MOUTH DAILY 01/12/15  Yes Jodelle Gross, NP  rivaroxaban (XARELTO) 20 MG TABS tablet TAKE 1 TABLET (20 MG TOTAL) BY MOUTH DAILY. 01/13/15  Yes Laqueta Linden, MD   Fam HX:   History reviewed. No pertinent family history. Social HX:    Social History   Social History  . Marital Status: Single    Spouse Name: N/A  . Number of Children: N/A  . Years of Education: N/A   Occupational History  . Disabled    Social History Main Topics  . Smoking status: Never Smoker   . Smokeless tobacco: Never Used  . Alcohol Use: No  . Drug Use: No  . Sexual Activity: Not on file   Other Topics Concern  . Not on file   Social History Narrative   Lives alone with family nearby     ROS:  All 11 ROS were addressed and are negative except what is stated in the HPI  Physical Exam: Blood pressure 83/58, pulse 89, temperature 97.4 F (36.3 C), temperature source Oral, resp. rate 24, height 5\' 8"  (1.727 m), weight 409 lb 4.8 oz (185.657 kg), SpO2 96 %.   General: Well developed, well nourished, in no acute distress, sitting up, disheveled Head: Eyes PERRLA, No xanthomas.   Normal cephalic and atramatic  Lungs:   Clear bilaterally to auscultation and percussion. Normal respiratory effort. No wheezes, no rales. Heart:   Irregularly irregular, mildly tachycardic (110s to 120s) Pulses are 2+ & equal. No murmur, rubs, gallops.  No carotid bruit. No JVD.  No abdominal bruits.  Abdomen: Bowel sounds are positive,  abdomen soft without masses. No hepatosplenomegaly. Morbidly obese, difficult to fully examine. Right upper quadrant tenderness was appreciated before. Large erythematous plaque on his abdomen with surgical wound site covered up, foul-smelling. Msk:  Back normal. Normal strength and tone for age. Extremities:  No clubbing, cyanosis or 2+ chronic appearing lower extremity edema.  DP +1 Neuro: Alert and oriented X 3, non-focal, MAE x 4 GU: Deferred Rectal: Deferred Psych:  Good affect, responds appropriately      Labs: Lab Results  Component Value Date   WBC 9.2 07/04/2015   HGB 13.8 07/04/2015   HCT 43.4 07/04/2015   MCV 84.1 07/04/2015   PLT 147* 07/04/2015     Recent Labs Lab 07/04/15 0240  NA 138  K 3.6  CL 105  CO2 24  BUN 25*  CREATININE 1.32*  CALCIUM 8.4*  PROT 6.8  BILITOT 1.1  ALKPHOS 43  ALT 12*  AST 19  GLUCOSE 103*    Recent Labs  07/04/15 0240  TROPONINI <0.03   Lab Results  Component Value Date   CHOL  01/14/2008    115        ATP III CLASSIFICATION:  <200     mg/dL   Desirable  161-096  mg/dL   Borderline High  >=045    mg/dL   High   HDL 21* 40/98/1191   LDLCALC  01/14/2008    86        Total Cholesterol/HDL:CHD Risk Coronary Heart Disease Risk Table                     Men   Women  1/2 Average Risk   3.4   3.3   TRIG 42 01/14/2008   No results found for: DDIMER   Radiology:  Dg Chest Port 1 View  07/04/2015   CLINICAL DATA:  Shortness of breath.  EXAM: PORTABLE CHEST - 1 VIEW  COMPARISON:  One-view chest x-ray 11/10/2014.  FINDINGS: The heart is enlarged. Mild edema is present. No focal airspace consolidation is present.  IMPRESSION: Cardiomegaly and mild edema.   Electronically Signed   By: Marin Roberts M.D.   On: 07/04/2015 03:03   Ct Angio Chest Aorta W/cm &/or Wo/cm  07/04/2015   CLINICAL DATA:  Acute onset of generalized body aches and weakness. Chills. Initial encounter.  EXAM: CT ANGIOGRAPHY CHEST WITH CONTRAST   TECHNIQUE: Multidetector CT imaging of the chest was performed using the standard protocol during bolus administration of intravenous contrast. Multiplanar CT image reconstructions and MIPs were obtained to evaluate the vascular anatomy.  CONTRAST:  OMNIPAQUE IOHEXOL 350 MG/ML SOLN  COMPARISON:  Chest radiograph performed earlier today at 2:46 a.m., and CT of the chest performed 02/06/2015  FINDINGS: The ascending thoracic aorta measures 5.0 cm in AP dimension. This is stable from the prior study and does  not appear unusually prominent for the patient's size. No aortic dissection is seen.  There is no evidence of central pulmonary embolus.  Minimal bilateral atelectasis is noted. The lungs are otherwise clear. There is no evidence of significant focal consolidation, pleural effusion or pneumothorax. No masses are identified; no abnormal focal contrast enhancement is seen.  The mediastinum is unremarkable in appearance. No mediastinal lymphadenopathy is seen. No pericardial effusion is identified. The great vessels are grossly unremarkable in appearance. No axillary lymphadenopathy is seen. The visualized portions of the thyroid gland are unremarkable in appearance.  The visualized portions of the liver and spleen are unremarkable. The visualized portions of the pancreas, gallbladder, stomach, adrenal glands and kidneys are within normal limits.  No acute osseous abnormalities are seen.  Review of the MIP images confirms the above findings.  IMPRESSION: 1. No evidence of aortic dissection. 2. No evidence of central pulmonary embolus. 3. Ascending thoracic aorta measures 5.0 cm in AP dimension. This is stable from the prior study and does not appear unusually prominent for the patient's size. 4. Minimal bilateral atelectasis noted.  Lungs otherwise clear.   Electronically Signed   By: Roanna Raider M.D.   On: 07/04/2015 05:34   Personally viewed.  EKG:  Atrial fibrillation with rapid ventricular response,  multiple EKGs reviewed, nonspecific ST-T wave changes Personally viewed.   Echocardiogram: 01/17/13 - Left ventricle: The cavity size was mildly dilated. There was mild concentric hypertrophy. Systolic function was variable based upon the R-R interval in AF. At the slowest heart rates, the estimated ejection fraction wasnormal or near normal without segmental wall motion abnormalities. - Aortic valve: Mildly to moderately calcified annulus. Mildly thickened, mildly calcified leaflets. - Ascending aorta: The ascending aorta was mildly to moderately dilated. - Mitral valve: Calcified annulus. - Left atrium: The atrium was mildly to moderately dilated. - Right ventricle: The cavity size was normal. Wall thickness was mildly increased. Systolic function was mildly to moderately reduced. - Atrial septum: No defect or patent foramen ovale was identified.   ASSESSMENT/PLAN:    64 year old male with morbid obesity, abdominal wound, right upper quadrant pain, fever, hypotension, atrial fibrillation with rapid ventricular response, chronic anticoagulation.  1. Atrial fibrillation with rapid ventricular response  - Currently heart rate is in the 110s to 120s range, fairly stable. Previously in the 170s reported. Failed cardioversion attempt 2. This is likely secondary to not only his underlying comorbidities such as morbid obesity but also secondary to underlying presumed infection given his fever which will precipitate increased heart rates with atrial fibrillation. Furthermore, he has been in chronic atrial fibrillation likely for quite some time. Office visit note from 02/19/15 demonstrated atrial fibrillation. Chronicity of atrial fibrillation also determines success rate of cardioversion.  - For now, he is sitting up, mentating and conversant with blood pressures in the 90s (question reliability given his body habitus and arm cuff). If heart rate becomes more of an issue, one  could consider amiodarone for rate control however for now, would focus on treatment of sepsis.  2. Sepsis  - Hypotension, lactate is elevated. Fluid resuscitation has been taking place. Has normal left ventricular ejection fraction, should be able to tolerate fluid resuscitation.  - Vancomycin and Zosyn have been started in emergency department. Further treatment and management per primary team.  3. Chronic anticoagulation  - Continue Xarelto  4. Morbid obesity  - Encourage weight loss  5. Lactic acidosis  - Lactate was elevated, hypoperfusion, continue with resuscitation  6. Dilated  aortic root  - Stable, 5 cm. Rescheduled prior cardiothoracic surgery appointment.  - No evidence of pulmonary embolism on CT scan  We will follow along with you. Multiple comorbidities, morbid obesity, abdominal wound, fever. As these comorbidities or improved, his atrial fibrillation rate control will improve as well.  Donato Schultz, MD  07/04/2015  7:53 AM

## 2015-07-04 NOTE — ED Provider Notes (Signed)
Patient received in transfer from [facility] ED.  Briefly, 64 y.o. M who arrived at [facility] in Afib RVR, he does have hx of AFIB.  Electric cardioversion attempted x2 without success.  Cardiologist, Dr. Avel Peace, recommended ER to ER transfer, cardiology to take over care once arrived in [facility] ER.  Results for orders placed or performed during the hospital encounter of 07/04/15  Blood Culture (routine x 2)  Result Value Ref Range   Specimen Description LEFT ANTECUBITAL    Special Requests BOTTLES DRAWN AEROBIC ONLY 6CC DRAWN BY RN    Culture NO GROWTH < 12 HOURS    Report Status PENDING   Blood Culture (routine x 2)  Result Value Ref Range   Specimen Description BLOOD RIGHT HAND    Special Requests BOTTLES DRAWN AEROBIC AND ANAEROBIC 6CC    Culture NO GROWTH < 12 HOURS    Report Status PENDING   Comprehensive metabolic panel  Result Value Ref Range   Sodium 138 135 - 145 mmol/L   Potassium 3.6 3.5 - 5.1 mmol/L   Chloride 105 101 - 111 mmol/L   CO2 24 22 - 32 mmol/L   Glucose, Bld 103 (H) 65 - 99 mg/dL   BUN 25 (H) 6 - 20 mg/dL   Creatinine, Ser 4.09 (H) 0.61 - 1.24 mg/dL   Calcium 8.4 (L) 8.9 - 10.3 mg/dL   Total Protein 6.8 6.5 - 8.1 g/dL   Albumin 3.5 3.5 - 5.0 g/dL   AST 19 15 - 41 U/L   ALT 12 (L) 17 - 63 U/L   Alkaline Phosphatase 43 38 - 126 U/L   Total Bilirubin 1.1 0.3 - 1.2 mg/dL   GFR calc non Af Amer 56 (L) >60 mL/min   GFR calc Af Amer >60 >60 mL/min   Anion gap 9 5 - 15  CBC WITH DIFFERENTIAL  Result Value Ref Range   WBC 9.2 4.0 - 10.5 K/uL   RBC 5.16 4.22 - 5.81 MIL/uL   Hemoglobin 13.8 13.0 - 17.0 g/dL   HCT 81.1 91.4 - 78.2 %   MCV 84.1 78.0 - 100.0 fL   MCH 26.7 26.0 - 34.0 pg   MCHC 31.8 30.0 - 36.0 g/dL   RDW 95.6 21.3 - 08.6 %   Platelets 147 (L) 150 - 400 K/uL   Neutrophils Relative % 94 (H) 43 - 77 %   Neutro Abs 8.7 (H) 1.7 - 7.7 K/uL   Lymphocytes Relative 3 (L) 12 - 46 %   Lymphs Abs 0.3 (L) 0.7 - 4.0 K/uL   Monocytes Relative 2 (L) 3 - 12 %   Monocytes  Absolute 0.2 0.1 - 1.0 K/uL   Eosinophils Relative 1 0 - 5 %   Eosinophils Absolute 0.1 0.0 - 0.7 K/uL   Basophils Relative 0 0 - 1 %   Basophils Absolute 0.0 0.0 - 0.1 K/uL  Urinalysis, Routine w reflex microscopic (not at [facility])  Result Value Ref Range   Color, Urine AMBER (A) YELLOW   APPearance CLEAR CLEAR   Specific Gravity, Urine 1.025 1.005 - 1.030   pH 5.5 5.0 - 8.0   Glucose, UA NEGATIVE NEGATIVE mg/dL   Hgb urine dipstick NEGATIVE NEGATIVE   Bilirubin Urine SMALL (A) NEGATIVE   Ketones, ur 15 (A) NEGATIVE mg/dL   Protein, ur NEGATIVE NEGATIVE mg/dL   Urobilinogen, UA 1.0 0.0 - 1.0 mg/dL   Nitrite NEGATIVE NEGATIVE   Leukocytes, UA NEGATIVE NEGATIVE  Troponin I  Result Value Ref Range  Troponin I <0.03 <0.031 ng/mL  Brain natriuretic peptide  Result Value Ref Range   B Natriuretic Peptide 123.0 (H) 0.0 - 100.0 pg/mL  Protime-INR  Result Value Ref Range   Prothrombin Time 20.2 (H) 11.6 - 15.2 seconds   INR 1.72 (H) 0.00 - 1.49  I-Stat CG4 Lactic Acid, ED  (not at  [facility])  Result Value Ref Range   Lactic Acid, Venous 2.23 (HH) 0.5 - 2.0 mmol/L   Comment NOTIFIED PHYSICIAN   I-Stat CG4 Lactic Acid, ED  (not at  [facility])  Result Value Ref Range   Lactic Acid, Venous 2.77 (HH) 0.5 - 2.0 mmol/L   Comment NOTIFIED PHYSICIAN    Dg Chest Port 1 View  07/04/2015   CLINICAL DATA:  Shortness of breath.  EXAM: PORTABLE CHEST - 1 VIEW  COMPARISON:  One-view chest x-ray 11/10/2014.  FINDINGS: The heart is enlarged. Mild edema is present. No focal airspace consolidation is present.  IMPRESSION: Cardiomegaly and mild edema.   Electronically Signed   By: Marin Roberts M.D.   On: 07/04/2015 03:03   Ct Angio Chest Aorta W/cm &/or Wo/cm  07/04/2015   CLINICAL DATA:  Acute onset of generalized body aches and weakness. Chills. Initial encounter.  EXAM: CT ANGIOGRAPHY CHEST WITH CONTRAST  TECHNIQUE: Multidetector CT imaging of the chest was performed using the standard protocol during  bolus administration of intravenous contrast. Multiplanar CT image reconstructions and MIPs were obtained to evaluate the vascular anatomy.  CONTRAST:  OMNIPAQUE IOHEXOL 350 MG/ML SOLN  COMPARISON:  Chest radiograph performed earlier today at 2:46 a.m., and CT of the chest performed 02/06/2015  FINDINGS: The ascending thoracic aorta measures 5.0 cm in AP dimension. This is stable from the prior study and does not appear unusually prominent for the patient's size. No aortic dissection is seen.  There is no evidence of central pulmonary embolus.  Minimal bilateral atelectasis is noted. The lungs are otherwise clear. There is no evidence of significant focal consolidation, pleural effusion or pneumothorax. No masses are identified; no abnormal focal contrast enhancement is seen.  The mediastinum is unremarkable in appearance. No mediastinal lymphadenopathy is seen. No pericardial effusion is identified. The great vessels are grossly unremarkable in appearance. No axillary lymphadenopathy is seen. The visualized portions of the thyroid gland are unremarkable in appearance.  The visualized portions of the liver and spleen are unremarkable. The visualized portions of the pancreas, gallbladder, stomach, adrenal glands and kidneys are within normal limits.  No acute osseous abnormalities are seen.  Review of the MIP images confirms the above findings.  IMPRESSION: 1. No evidence of aortic dissection. 2. No evidence of central pulmonary embolus. 3. Ascending thoracic aorta measures 5.0 cm in AP dimension. This is stable from the prior study and does not appear unusually prominent for the patient's size. 4. Minimal bilateral atelectasis noted.  Lungs otherwise clear.   Electronically Signed   By: Roanna Raider M.D.   On: 07/04/2015 05:34     6:51 AM Patient arrived in ER, awake, alert.  Appears stable.  HR 120's- 130's at this time.  BP stable.  Will make cardiology aware of patient arrival in ED.  7:10  AM Discussed case with Dr. Sullivan Lone of cardiology, coming to see patient now.  8:04 AM Cardiology, Dr. Anne Fu, has evaluated patient.  Prefers medical admission given ongoing sepsis.  Will manage from cardiac standpoint.   Repeat lactic acid 2.77 at this time.  Patient has already been given vanc/zosyn prior to  arrival.  Blood and urine cultures pending.  Case discussed with IM teaching service, Dr. Andrey Campanile-- will admit to step-down.  BP remains soft but patient appears very stable-- up to use bedside commode, tolerated PO well.  Given additional liter of fluid, will monitor this closely given his CHF status. Temp admission orders placed.  Garlon Hatchet, PA-C 07/04/15 1344  Dione Booze, MD 07/04/15 [phone]

## 2015-07-04 NOTE — H&P (Signed)
Date: 07/04/2015               Patient Name:  Jeremy Rush  DOB: 09-20-1951 Age / Sex: 64 y.o., male   PCP: No Pcp Per Patient         Medical Service: Internal Medicine Teaching Service, B2 [facility] March         Attending Physician: Dr. Doneen Poisson, MD    First Contact: Carolynn Comment, MS4 Pager: [phone]  Second Contact: Dr. Andrey Campanile Pager: [phone]       After Hours (After 5p/  First Contact Pager: [phone]  weekends / holidays): Second Contact Pager: [phone]   Chief Complaint:  "Feeling sick and couldn't sleep"  History of Present Illness: Jeremy Rush is a 64 y.o. male with a h/o of CHF, HTN, A-fib on anticoagulation, and non-healing abdominal wound who presents with complaints of subjective fevers, HA, and feeling sick. He reports that his symptoms started last night around 9pm and worsened through the night preventing him from sleeping. He endorses some chills and sweats, but did not check his temperature. He denies CP/SOB. He also complains of some nausea, but denies vomiting. He denies diarrhea/constipation and his last BM was in the ER which was normal. He reports frequent urination which is usual due to his lasix, but denies dysuria or changes in his urine color/smell. He denies any new chest palpitations or feeling like his heart is racing, but reports that his heart rhythm is chronically abnormal. He took all of his home medications yesterday (lisinopril, lasix, diltiazem, xarelto) but did not take anything else since the onset of his symptoms.  He originally presented to OSH and was found to be in a-fib with RVR. He was also found to be febrile to 38.2 C with soft BPs in the 100s/70s. He has a chronically non-healing abdominal wound from a 2002 ventral hernia repair by Dr. Ezzard Standing at [facility]. He reports that the wound is chronically draining and endorses a small amount of bleeding daily, but denies any pain or abdominal tenderness. He was  transferred to [facility] for evaluation and management of sepsis and a-fib w/ RVR.  Upon transfer, he was found to be hypotensive down to 52/39 with HR from 150-175, but remained asymptomatic and alert. He was treated with IVF. Cardiology was consulted due to instability and cardioversion was attempted twice without successful rhythm conversion. However, following cardioversion, his rate responded to the 90s. His initial work-up revealed a lactic acid of 2.23 which trended up to 2.77, but did not show evidence of leukocytosis (WBC 9.2). Urinalysis was non-contributory. Urine cx and blood cx were sent for analysis. A CT was obtained which showed no evidence of intra-pulmonary process or PE.  Meds: Medications Prior to Admission  Medication Sig Dispense Refill  . acetaminophen (TYLENOL) 500 MG tablet Take 500 mg by mouth every 6 (six) hours as needed for pain.    Marland Kitchen diltiazem (CARDIZEM LA) 240 MG 24 hr tablet Take 1 tablet (240 mg total) by mouth daily. TAKE 1 TABLET BY MOUTH EVERY DAY 30 tablet 6  . furosemide (LASIX) 20 MG tablet Take 1 tablet daily 30 tablet 6  . lisinopril (PRINIVIL,ZESTRIL) 10 MG tablet TAKE 1 TABLET (10 MG TOTAL) BY MOUTH DAILY. 90 tablet 2  . loratadine (CLARITIN) 10 MG tablet TAKE 1 TABLET BY MOUTH DAILY 30 tablet 3  . rivaroxaban (XARELTO) 20 MG TABS tablet TAKE 1 TABLET (20 MG TOTAL) BY MOUTH DAILY. 30  tablet 6   Allergies: Allergies as of 07/04/2015 - Review Complete 07/04/2015  Allergen Reaction Noted  . Codeine Other (See Comments) 09/21/2011  . Morphine and related Other (See Comments) 09/21/2011   Past Medical History  Diagnosis Date  . Morbid obesity   . Hypertension   . DJD (degenerative joint disease)     Knees  . Atrial fibrillation 2014    Initial diagnosis in 12/2012  . Hyperlipidemia    Past Surgical History  Procedure Laterality Date  . Ventral hernia repair  1990s-2000    X4  . Abdominal debridement  2009    Dr. Ovidio Kin, infected mesh/abscess  .  Wrist surgery      Right   History reviewed. No pertinent family history. Social History   Social History  . Marital Status: Single    Spouse Name: N/A  . Number of Children: N/A  . Years of Education: N/A   Occupational History  . Disabled    Social History Main Topics  . Smoking status: Never Smoker   . Smokeless tobacco: Never Used  . Alcohol Use: No  . Drug Use: No  . Sexual Activity: Not on file   Other Topics Concern  . Not on file   Social History Narrative   Lives alone with family nearby    Review of Systems: Pertinent items are noted in HPI.  Physical Exam: Blood pressure 116/78, pulse 95, temperature 97.4 F (36.3 C), temperature source Oral, resp. rate 37, height 5\' 8"  (1.727 m), weight 185.657 kg (409 lb 4.8 oz), SpO2 96 %. BP 116/78 mmHg  Pulse 95  Temp(Src) 97.4 F (36.3 C) (Oral)  Resp 37  Ht 5\' 8"  (1.727 m)  Wt 185.657 kg (409 lb 4.8 oz)  BMI 62.25 kg/m2  SpO2 96%  General Appearance:    Alert, cooperative, no distress, appears stated age  Head:    Normocephalic, without obvious abnormality, atraumatic  Eyes:    PERRL, conjunctiva/corneas clear, EOM's grossly intact       Throat:   Dry mucous membranes  Neck:   Supple, symmetrical, trachea midline, no adenopathy;       thyroid:  No enlargement/tenderness/nodules; no JVD  Back:     Symmetric, no curvature, ROM normal, no CVA tenderness  Lungs:     Diffuse inspiratory wheezes throughout, poor air movement, no rales/rhonchi  Chest wall:    No tenderness or deformity  Heart:    Irregularly irregular rate and rhythm, pulse rate wnl, S1 and S2 normal, no murmur, rub or gallop  Abdomen:     Morbidly obese, non-tender, bowel sounds active,   no masses, no organomegaly, no rebound/guarding, 9x4 cm erythematous abdominal wound draining purulent discharge from a pinpoint tract, the base of which could not be visualized, the wound was non-tender to palpation, adjacent ~6x6 cm erythematous plague with hard  scaley papules which was non-pruritic, non-tender  Extremities:   Dark venous stasis changes in BLE, tenderness and 1+ pitting edema LLE / 2+ pitting edema RLE, calves without focal tenderness  Pulses:   2+ and symmetric all extremities  Skin:   Skin color, texture, turgor normal  Neurologic:   CNII-XII intact. Normal strength, sensation and reflexes      Throughout. Negative menigeal sxs    Labs:  Basic Metabolic Panel:  Recent Labs Lab 07/04/15 0240  NA 138  K 3.6  CL 105  CO2 24  GLUCOSE 103*  BUN 25*  CREATININE 1.32*  CALCIUM 8.4*  Liver Function Tests:  Recent Labs Lab 07/04/15 0240  AST 19  ALT 12*  ALKPHOS 43  BILITOT 1.1  PROT 6.8  ALBUMIN 3.5   CBC:  Recent Labs Lab 07/04/15 0240  WBC 9.2  NEUTROABS 8.7*  HGB 13.8  HCT 43.4  MCV 84.1  PLT 147*    Cardiac Enzymes:  Recent Labs Lab 07/04/15 0240  TROPONINI <0.03    BNP: Recent Labs     07/04/15  0240  BNP  123.0*   Microbiology: Results for orders placed or performed during the hospital encounter of 07/04/15  Blood Culture (routine x 2)     Status: None (Preliminary result)   Collection Time: 07/04/15  2:40 AM  Result Value Ref Range Status   Specimen Description LEFT ANTECUBITAL  Final   Special Requests BOTTLES DRAWN AEROBIC ONLY 6CC DRAWN BY RN  Final   Culture NO GROWTH < 12 HOURS  Final   Report Status PENDING  Incomplete  Blood Culture (routine x 2)     Status: None (Preliminary result)   Collection Time: 07/04/15  2:45 AM  Result Value Ref Range Status   Specimen Description BLOOD RIGHT HAND  Final   Special Requests BOTTLES DRAWN AEROBIC AND ANAEROBIC 6CC  Final   Culture NO GROWTH < 12 HOURS  Final   Report Status PENDING  Incomplete    Coagulation Studies:  Recent Labs  07/04/15 0240  LABPROT 20.2*  INR 1.72*     Other results: EKG: atrial fibrillation, rate 141, prolonged QT interval.  Imaging: Dg Chest Port 1 View  07/04/2015   CLINICAL DATA:   Shortness of breath.  EXAM: PORTABLE CHEST - 1 VIEW  COMPARISON:  One-view chest x-ray 11/10/2014.  FINDINGS: The heart is enlarged. Mild edema is present. No focal airspace consolidation is present.  IMPRESSION: Cardiomegaly and mild edema.   Electronically Signed   By: Marin Roberts M.D.   On: 07/04/2015 03:03   Ct Angio Chest Aorta W/cm &/or Wo/cm  07/04/2015   CLINICAL DATA:  Acute onset of generalized body aches and weakness. Chills. Initial encounter.  EXAM: CT ANGIOGRAPHY CHEST WITH CONTRAST  TECHNIQUE: Multidetector CT imaging of the chest was performed using the standard protocol during bolus administration of intravenous contrast. Multiplanar CT image reconstructions and MIPs were obtained to evaluate the vascular anatomy.  CONTRAST:  OMNIPAQUE IOHEXOL 350 MG/ML SOLN  COMPARISON:  Chest radiograph performed earlier today at 2:46 a.m., and CT of the chest performed 02/06/2015  FINDINGS: The ascending thoracic aorta measures 5.0 cm in AP dimension. This is stable from the prior study and does not appear unusually prominent for the patient's size. No aortic dissection is seen.  There is no evidence of central pulmonary embolus.  Minimal bilateral atelectasis is noted. The lungs are otherwise clear. There is no evidence of significant focal consolidation, pleural effusion or pneumothorax. No masses are identified; no abnormal focal contrast enhancement is seen.  The mediastinum is unremarkable in appearance. No mediastinal lymphadenopathy is seen. No pericardial effusion is identified. The great vessels are grossly unremarkable in appearance. No axillary lymphadenopathy is seen. The visualized portions of the thyroid gland are unremarkable in appearance.  The visualized portions of the liver and spleen are unremarkable. The visualized portions of the pancreas, gallbladder, stomach, adrenal glands and kidneys are within normal limits.  No acute osseous abnormalities are seen.  Review of the MIP  images confirms the above findings.  IMPRESSION: 1. No evidence of aortic dissection. 2.  No evidence of central pulmonary embolus. 3. Ascending thoracic aorta measures 5.0 cm in AP dimension. This is stable from the prior study and does not appear unusually prominent for the patient's size. 4. Minimal bilateral atelectasis noted.  Lungs otherwise clear.   Electronically Signed   By: Roanna Raider M.D.   On: 07/04/2015 05:34      Assessment & Plan by Problem: Principal Problem:   A-fib Active Problems:   Wound infection, chronic anterior abdominal abscess.   CHF (congestive heart failure)   Mr. THAISON KOLODZIEJSKI is a 64 y.o. male with h/o of CHF, HTN, A-fib on anticoagulation, and non-healing abdominal wound who presents for evaluation of A-fib with RVR and possible sepsis.  1) A-fib with RVR: Currently stable with HR 90s. Recently unstable BPs with uncontrolled HR in the 170s. Likely chronic a-fib, since at least March 2016 (documented on cardiology clinic note). s/p cardioversion x2 without rhythm conversion, but rate control achieved. Has been on anticoagulation with Xarelto for years. CHA2DS2-VASc score 2. Recent instability likely secondary to acute infection. Will continue rate control with diltiazem and supportive therapy while treating infection. Plan for reassessment, with possible transition to amiodarone if necessary. -- Admit to step-down on tele -- Cardiology consulted, appreciate recs:  - Continue dilt, consider amio  - Will follow and reassess -- Continue diltizem PO  q6h -- Continue IVF, NS @ 125cc/hr -- Continue Xarelto  qD  2) Sepsis: Elevated lactate at 2.77. Febrile at 38.2. Exacerbation of a-fib w/ RVR. Unhealing abd wound 2/2 hernia repair in 2002 with yellow/purulent drainage, seen by Dr. Ezzard Standing for frequent debridement of chronic mesh infection. qSOFA score "high risk" for sepsis. PE r/o on CT, w/o evidence of pulmonary process, UA without signs of UTI, no  meningeal signs. No leukocytosis with WBC at 9.2. Likely source abdominal wound. -- Surgery consulted, awaiting recs  - Recommend CT Abd/Pelvis  - Cover wound with dry dressing -- Continue empiric tx with Vanc/Zosyn per pharmacy -- IVF as above -- f/u BCx -- f/u UCx  3) CHF: BNP 123. Last EF 45-50%. Currently stable without signs of fluid overload. Will monitor carefully given a-fib w/ RVR. Will likely tolerate IVF. -- Hold lasix given hypotension  4) HTN: BPs low currently given acute illness. Question measurement accurary given obese body habitus and forearm cuff. -- Hold ACE-I for AKI  5) AKI: SCr 1.3, baseline likely 1.1. Likely secondary to acute hypotension/hypoperfusion. -- IVF as above  6) Abdominal skin rash: Reportedly chronic. Erythematous plaque like rash with scaley papules. Non-tender, non-pruritic. Possible related to non-healing abdominal wound given proximity. Will continue to monitor for now.  7) Back pain: Chronic, paraspinal back pain, worsened with lying supine. No bruising, or rash. -- APAP PRN pain   DVT PPX - Xarelto  CODE STATUS - Full  CONSULTS PLACED - Cariology, Surgery  DISPO - Admit to Internal Medicine Teaching Service, B2 [facility], Inpatient Team  This is a Psychologist, occupational Note.  The care of the patient was discussed with Dr. Andrey Campanile and the assessment and plan was formulated with their assistance.  Please see their note for official documentation of the patient encounter.   Signed: Burna Cash, MS4 Pager: [phone] 07/04/2015, 1:21 PM

## 2015-07-05 DIAGNOSIS — I482 Chronic atrial fibrillation: Principal | ICD-10-CM

## 2015-07-05 DIAGNOSIS — R6510 Systemic inflammatory response syndrome (SIRS) of non-infectious origin without acute organ dysfunction: Secondary | ICD-10-CM

## 2015-07-05 LAB — BASIC METABOLIC PANEL
ANION GAP: 9 (ref 5–15)
Anion gap: 9 (ref 5–15)
BUN: 17 mg/dL (ref 6–20)
BUN: 16 mg/dL (ref 6–20)
CALCIUM: 7.9 mg/dL — AB (ref 8.9–10.3)
CO2: 17 mmol/L — AB (ref 22–32)
CO2: 23 mmol/L (ref 22–32)
CREATININE: 1.18 mg/dL (ref 0.61–1.24)
Calcium: 8.3 mg/dL — ABNORMAL LOW (ref 8.9–10.3)
Chloride: 110 mmol/L (ref 101–111)
Chloride: 105 mmol/L (ref 101–111)
Creatinine, Ser: 1.2 mg/dL (ref 0.61–1.24)
GLUCOSE: 80 mg/dL (ref 65–99)
Glucose, Bld: 97 mg/dL (ref 65–99)
Potassium: 5.6 mmol/L — ABNORMAL HIGH (ref 3.5–5.1)
Potassium: 4 mmol/L (ref 3.5–5.1)
SODIUM: 137 mmol/L (ref 135–145)
Sodium: 136 mmol/L (ref 135–145)

## 2015-07-05 LAB — CBC
HEMATOCRIT: 42.5 % (ref 39.0–52.0)
Hemoglobin: 13.9 g/dL (ref 13.0–17.0)
MCH: 26.9 pg (ref 26.0–34.0)
MCHC: 32.7 g/dL (ref 30.0–36.0)
MCV: 82.2 fL (ref 78.0–100.0)
Platelets: 93 10*3/uL — ABNORMAL LOW (ref 150–400)
RBC: 5.17 MIL/uL (ref 4.22–5.81)
RDW: 15.9 % — AB (ref 11.5–15.5)
WBC: 10 10*3/uL (ref 4.0–10.5)

## 2015-07-05 LAB — URINE CULTURE: Culture: NO GROWTH

## 2015-07-05 LAB — LACTIC ACID, PLASMA: LACTIC ACID, VENOUS: 1.2 mmol/L (ref 0.5–2.0)

## 2015-07-05 LAB — CALCITONIN: Calcitonin: 36.8 pg/mL — ABNORMAL HIGH (ref 0.0–8.4)

## 2015-07-05 MED ORDER — DILTIAZEM HCL 90 MG PO TABS
90.0000 mg | ORAL_TABLET | Freq: Three times a day (TID) | ORAL | Status: DC
  Administered 2015-07-05 – 2015-07-06 (×5): 90 mg via ORAL
  Filled 2015-07-05 (×2): qty 1
  Filled 2015-07-05: qty 3
  Filled 2015-07-05 (×4): qty 1

## 2015-07-05 MED ORDER — LORAZEPAM 1 MG PO TABS
1.0000 mg | ORAL_TABLET | Freq: Once | ORAL | Status: AC
  Administered 2015-07-05: 1 mg via ORAL
  Filled 2015-07-05: qty 1

## 2015-07-05 NOTE — Progress Notes (Signed)
Per pt's request lab will come back to draw am labs at 0600.  Will continue to monitor. Karena Addison T

## 2015-07-05 NOTE — Progress Notes (Signed)
Subjective: Up in chair  Objective: Vital signs in last 24 hours: Temp:  [97.8 F (36.6 C)-99.4 F (37.4 C)] 98.3 F (36.8 C) (08/14 0700) Pulse Rate:  [65-102] 92 (08/14 0700) Resp:  [20-37] 23 (08/14 0700) BP: (94-144)/(37-100) 135/94 mmHg (08/14 0700) SpO2:  [93 %-100 %] 98 % (08/14 0700) Weight:  [188.2 kg (414 lb 14.5 oz)] 188.2 kg (414 lb 14.5 oz) (08/14 0500) Last BM Date: 07/05/15  Intake/Output from previous day: 08/13 0701 - 08/14 0700 In: 2373.3 [P.O.:240; I.V.:483.3; IV Piggyback:1650] Out: 1275 [Urine:1275] Intake/Output this shift: Total I/O In: 120 [P.O.:120] Out: -   General appearance: cooperative Cardio: irregularly irregular rhythm and 130s GI: soft, chronic draining wound above umbilicus with tan output  Lab Results:   Recent Labs  07/04/15 0240  WBC 9.2  HGB 13.8  HCT 43.4  PLT 147*   BMET  Recent Labs  07/04/15 0240  NA 138  K 3.6  CL 105  CO2 24  GLUCOSE 103*  BUN 25*  CREATININE 1.32*  CALCIUM 8.4*   PT/INR  Recent Labs  07/04/15 0240  LABPROT 20.2*  INR 1.72*   ABG No results for input(s): PHART, HCO3 in the last 72 hours.  Invalid input(s): PCO2, PO2  Studies/Results: Dg Chest Port 1 View  07/04/2015   CLINICAL DATA:  Shortness of breath.  EXAM: PORTABLE CHEST - 1 VIEW  COMPARISON:  One-view chest x-ray 11/10/2014.  FINDINGS: The heart is enlarged. Mild edema is present. No focal airspace consolidation is present.  IMPRESSION: Cardiomegaly and mild edema.   Electronically Signed   By: Marin Roberts M.D.   On: 07/04/2015 03:03   Ct Angio Chest Aorta W/cm &/or Wo/cm  07/04/2015   CLINICAL DATA:  Acute onset of generalized body aches and weakness. Chills. Initial encounter.  EXAM: CT ANGIOGRAPHY CHEST WITH CONTRAST  TECHNIQUE: Multidetector CT imaging of the chest was performed using the standard protocol during bolus administration of intravenous contrast. Multiplanar CT image reconstructions and MIPs were  obtained to evaluate the vascular anatomy.  CONTRAST:  OMNIPAQUE IOHEXOL 350 MG/ML SOLN  COMPARISON:  Chest radiograph performed earlier today at 2:46 a.m., and CT of the chest performed 02/06/2015  FINDINGS: The ascending thoracic aorta measures 5.0 cm in AP dimension. This is stable from the prior study and does not appear unusually prominent for the patient's size. No aortic dissection is seen.  There is no evidence of central pulmonary embolus.  Minimal bilateral atelectasis is noted. The lungs are otherwise clear. There is no evidence of significant focal consolidation, pleural effusion or pneumothorax. No masses are identified; no abnormal focal contrast enhancement is seen.  The mediastinum is unremarkable in appearance. No mediastinal lymphadenopathy is seen. No pericardial effusion is identified. The great vessels are grossly unremarkable in appearance. No axillary lymphadenopathy is seen. The visualized portions of the thyroid gland are unremarkable in appearance.  The visualized portions of the liver and spleen are unremarkable. The visualized portions of the pancreas, gallbladder, stomach, adrenal glands and kidneys are within normal limits.  No acute osseous abnormalities are seen.  Review of the MIP images confirms the above findings.  IMPRESSION: 1. No evidence of aortic dissection. 2. No evidence of central pulmonary embolus. 3. Ascending thoracic aorta measures 5.0 cm in AP dimension. This is stable from the prior study and does not appear unusually prominent for the patient's size. 4. Minimal bilateral atelectasis noted.  Lungs otherwise clear.   Electronically Signed   By: Leotis Shames  Chang M.D.   On: 07/04/2015 05:34    Anti-infectives: Anti-infectives    Start     Dose/Rate Route Frequency Ordered Stop   07/04/15 1600  vancomycin (VANCOCIN) 1,500 mg in sodium chloride 0.9 % 500 mL IVPB     1,500 mg 250 mL/hr over 120 Minutes Intravenous Every 12 hours 07/04/15 1402     07/04/15 1500   piperacillin-tazobactam (ZOSYN) IVPB 3.375 g     3.375 g 12.5 mL/hr over 240 Minutes Intravenous 3 times per day 07/04/15 1400     07/04/15 0330  vancomycin (VANCOCIN) 2,500 mg in sodium chloride 0.9 % 500 mL IVPB     2,500 mg 250 mL/hr over 120 Minutes Intravenous  Once 07/04/15 0319 07/04/15 0708   07/04/15 0330  piperacillin-tazobactam (ZOSYN) IVPB 3.375 g     3.375 g 12.5 mL/hr over 240 Minutes Intravenous  Once 07/04/15 0319 07/04/15 0430      Assessment/Plan: 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 We will F/U  LOS: 1 day    Jeremy Rush E 07/05/2015

## 2015-07-05 NOTE — Progress Notes (Signed)
Subjective: Jeremy Rush was seen and examined this AM.  He reports feeling a little better but still tired.  He slept sitting up in bedside chair last night but says he normally sleeps in his bed at home.  He denies orthopnea or PND but says the hospital bed was uncomfortable.  He is currently breathing normally with good SpO2 on RA.  Objective: Vital signs in last 24 hours: Filed Vitals:   07/05/15 0800 07/05/15 0900 07/05/15 1000 07/05/15 1130  BP:    126/93  Pulse:      Temp:    97.3 F (36.3 C)  TempSrc:    Oral  Resp: 25 29 30 27   Height:      Weight:      SpO2: 98% 94% 94%    Weight change: 24 lb 14.5 oz (11.297 kg)  Intake/Output Summary (Last 24 hours) at 07/05/15 1149 Last data filed at 07/05/15 4098  Gross per 24 hour  Intake 2613.33 ml  Output   1075 ml  Net 1538.33 ml   General: sitting up in chair HEENT: /AT Cardiac: irregularly irregular, no rubs, murmurs or gallops Pulm: difficult lung exam due to body habitus, no wheezes/rales/rhonchi, no accessory muscle use or respiratory distress Abd: large abdominal girth, soft, nondistended, nontender, BS present, bandage c/d/i and removed to inspect the 9x4cm erythematous wound with small opening with small amount of light green drainage, directly above the wound is a large red plaque like rash Ext: warm and well perfused, 2+ pedal edema (R>L), venous stasis skin changes Neuro: alert and oriented X3, responding appropriately, MAE spontaneously able to ambulate independently  Lab Results: Basic Metabolic Panel:  Recent Labs Lab 07/04/15 0240 07/05/15 0755  NA 138 136  K 3.6 5.6*  CL 105 110  CO2 24 17*  GLUCOSE 103* 80  BUN 25* 17  CREATININE 1.32* 1.18  CALCIUM 8.4* 7.9*   CBC:  Recent Labs Lab 07/04/15 0240 07/05/15 0755  WBC 9.2 10.0  NEUTROABS 8.7*  --   HGB 13.8 13.9  HCT 43.4 42.5  MCV 84.1 82.2  PLT 147* 93*   Medications: I have reviewed the patient's current medications. Scheduled  Meds: . diltiazem  90 mg Oral 3 times per day  . loratadine  10 mg Oral Daily  . piperacillin-tazobactam (ZOSYN)  IV  3.375 g Intravenous 3 times per day  . rivaroxaban  20 mg Oral Q supper  . sodium chloride  3 mL Intravenous Q12H  . 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  Elevated lactic acid was also concerning but may have also been due to poor perfusion in the setting of  Afib w/RVR).  Documented hypotension is questionable due to patient's large arm and BP was taken on the forearm in the ED so may be falsely low.  Surgery has evaluated the patient given hx of non-healing wound and chronic mesh infection and we appreciate recommendations.  The patient is still deciding if he will be willing to get the CT A/P. This AM he feels a little better but still fatigued.  He is hemodynamically stable and eating.  WBC still WNL but increased slightly from 9.2-->10.0.  Lactic acid now normal at 1.2. - d/c IV fluids since he is hemodynamically stable, eating well, now reporting increased leg swelling (  but no dyspnea or crackles on exam) - continue IV abx for now and monitor for clinical improvement, follow cultures - Aquacell added for wound care - CT A/P if and when patient agrees - if he continues to do well this afternoon, will plan to transfer to the floor with telemetry (recent cardioversion, prolonged QT) - will check to be sure he has the right size bed as he may need a bariatric bed to accommodate his size  Atrial fibrillation:  Initially in RVR but rates improved after electrical cardioversion.  Appreciate cardiology recommendations and intervention. - continue Xarelto and Cardizem (dose increased today to 90 q6h); titrate Cardizem as needed - continue cardiac monitoring  RV systolic  dysfunction:  Legs are little more swollen this AM after fluids yesterday and overnight.   - d/c IV fluids - continue to hold home Lasix today; reassess to restart tomorrow - monitoring respiratory status, weight, I&Os  Dispo: Disposition is deferred at this time, awaiting improvement of current medical problems.  Anticipated discharge in approximately 2-3 day(s).   The patient does not have a current PCP (No Pcp Per Patient) and does not need an [facility] hospital follow-up appointment after discharge.  The patient does not know have transportation limitations that hinder transportation to clinic appointments.  .Services Needed at time of discharge: Y = Yes, Blank = No PT:   OT:   RN:   Equipment:   Other:     LOS: 1 day   Francesca Oman, DO 07/05/2015, 11:49 AM

## 2015-07-05 NOTE — Progress Notes (Signed)
    SUBJECTIVE:  No acute SOB.  No chest pain   PHYSICAL EXAM Filed Vitals:   07/05/15 0100 07/05/15 0300 07/05/15 0500 07/05/15 0514  BP: 112/51 135/75  134/80  Pulse:      Temp: 97.8 F (36.6 C) 98.4 F (36.9 C)    TempSrc: Oral Axillary    Resp: 31 23    Height:      Weight:   414 lb 14.5 oz (188.2 kg)   SpO2: 96% 99%     General:  No distress Lungs:  Clear Heart:  Irregular Abdomen:  Positive bowel sounds, no rebound no guarding, obese Extremities:  Mild edema   LABS: Lab Results  Component Value Date   TROPONINI <0.03 07/04/2015   Results for orders placed or performed during the hospital encounter of 07/04/15 (from the past 24 hour(s))  MRSA PCR Screening     Status: None   Collection Time: 07/04/15  1:54 PM  Result Value Ref Range   MRSA by PCR NEGATIVE NEGATIVE  Lactic acid, plasma     Status: Abnormal   Collection Time: 07/04/15 10:05 PM  Result Value Ref Range   Lactic Acid, Venous 2.5 (HH) 0.5 - 2.0 mmol/L  Lactic acid, plasma     Status: None   Collection Time: 07/05/15 12:09 AM  Result Value Ref Range   Lactic Acid, Venous 1.2 0.5 - 2.0 mmol/L    Intake/Output Summary (Last 24 hours) at 07/05/15 0804 Last data filed at 07/05/15 0601  Gross per 24 hour  Intake 2373.33 ml  Output   1075 ml  Net 1298.33 ml    ASSESSMENT AND PLAN:  ATRIAL FIB:  Mr. NAINOA WOLDT has a CHA2DS2 - VASc score of 3 with a risk of stroke of 3.2%.  Continue Xarelto.  Rate is labile.  I will increase the IR Cardizem.  Probably be able to switch to CD in the AM.   Rollene Rotunda 07/05/2015 8:04 AM

## 2015-07-05 NOTE — Evaluation (Signed)
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 He said symptoms started suddenly last night and he was unable to sleep.Febrile in ED sepsis workup underway  Clinical Impression  Pt admitted with above diagnosis. Pt currently with functional limitations due to the deficits listed below (see PT Problem List).  Pt will benefit from skilled PT to increase their independence and safety with mobility to allow discharge to the venue listed below.    HR labile, incr to 138 observed highest with activity; 104 at end of session in chair; No dizziness with activity; O2 sats 94% with the majority of the session conducted on Room Air    Follow Up Recommendations Home health PT    Equipment Recommendations  Other (comment) (to be determined)    Recommendations for Other Services OT consult     Precautions / Restrictions Precautions Precaution Comments: watch HR with activity Restrictions Weight Bearing Restrictions: No      Mobility  Bed Mobility                  Transfers Overall transfer level: Needs assistance Equipment used: 1 person hand held assist Transfers: Sit to/from Stand Sit to Stand: Mod assist         General transfer comment: Light mod assist to power up and for steadiness  Ambulation/Gait Ambulation/Gait assistance: Min assist;+2 safety/equipment Ambulation Distance (Feet): 18 Feet (to and from sink) Assistive device: 1 person hand held assist Gait Pattern/deviations: Wide base of support Gait velocity: slowed Gait velocity interpretation: Below normal speed for age/gender General Gait Details: Benefits from min  assist for steadiness; Noted HR incr to 138 observed highest with amb and upright activity  Stairs            Wheelchair Mobility    Modified Rankin (Stroke Patients Only)       Balance                                             Pertinent Vitals/Pain Pain Assessment: No/denies pain    Home Living Family/patient expects to be discharged to:: Private residence Living Arrangements: Other relatives (Sister) Available Help at Discharge: Other (Comment) (Sister can't provide assist) Type of Home: House Home Access: Stairs to enter   Secretary/administrator of Steps: 1 Home Layout: One level        Prior Function Level of Independence: Independent               Hand Dominance        Extremity/Trunk Assessment   Upper Extremity Assessment: Generalized weakness           Lower Extremity Assessment: Generalized weakness (Noted uses UEs for support when on our feet)         Communication   Communication: No difficulties  Cognition Arousal/Alertness: Awake/alert Behavior During Therapy: WFL for tasks assessed/performed Overall Cognitive Status: Within Functional Limits for tasks assessed                      General Comments  Exercises        Assessment/Plan    PT Assessment Patient needs continued PT services  PT Diagnosis Difficulty walking;Generalized weakness   PT Problem List Decreased strength;Decreased range of motion;Decreased activity tolerance;Decreased balance;Decreased mobility;Decreased knowledge of use of DME;Cardiopulmonary status limiting activity  PT Treatment Interventions DME instruction;Gait training;Stair training;Functional mobility training;Therapeutic activities;Therapeutic exercise;Patient/family education   PT Goals (Current goals can be found in the Care Plan section) Acute Rehab PT Goals Patient Stated Goal: Hopes to feel better soon PT Goal Formulation: With patient Time For Goal  Achievement: 07/19/15 Potential to Achieve Goals: Good    Frequency Min 3X/week   Barriers to discharge Decreased caregiver support Must be independent    Co-evaluation               End of Session   Activity Tolerance: Patient tolerated treatment well Patient left: in chair;with call bell/phone within reach Nurse Communication: Mobility status         Time: 4098-1191 PT Time Calculation (min) (ACUTE ONLY): 20 min   Charges:   PT Evaluation $Initial PT Evaluation Tier I: 1 Procedure     PT G CodesOlen Rush 07/05/2015, 9:24 AM  Jeremy Rush, PT  Acute Rehabilitation Services Pager [phone] Office [phone]

## 2015-07-05 NOTE — Progress Notes (Signed)
Md notified of pt's restlessness and unable to sleep.  The 0.5 mg Ativan given earlier with no relief.  New orders received. Will continue to monitor. Karena Addison T

## 2015-07-05 NOTE — Progress Notes (Signed)
Pt arrived from [facility] via w/c to floor. Oriented to room.  Call bell at the bedside.  Instructed to call for assistance.  Verbalized understanding.   Family members at bedside.  Will continue to monitor.  Amanda Pea, RN,.

## 2015-07-06 DIAGNOSIS — I5023 Acute on chronic systolic (congestive) heart failure: Secondary | ICD-10-CM

## 2015-07-06 DIAGNOSIS — I4891 Unspecified atrial fibrillation: Secondary | ICD-10-CM

## 2015-07-06 LAB — CBC WITH DIFFERENTIAL/PLATELET
Basophils Absolute: 0 10*3/uL (ref 0.0–0.1)
Basophils Relative: 0 % (ref 0–1)
EOS ABS: 0.1 10*3/uL (ref 0.0–0.7)
Eosinophils Relative: 1 % (ref 0–5)
HEMATOCRIT: 41.5 % (ref 39.0–52.0)
HEMOGLOBIN: 13.5 g/dL (ref 13.0–17.0)
LYMPHS ABS: 0.6 10*3/uL — AB (ref 0.7–4.0)
LYMPHS PCT: 7 % — AB (ref 12–46)
MCH: 27.2 pg (ref 26.0–34.0)
MCHC: 32.5 g/dL (ref 30.0–36.0)
MCV: 83.5 fL (ref 78.0–100.0)
Monocytes Absolute: 0.9 10*3/uL (ref 0.1–1.0)
Monocytes Relative: 10 % (ref 3–12)
NEUTROS ABS: 7.1 10*3/uL (ref 1.7–7.7)
NEUTROS PCT: 82 % — AB (ref 43–77)
Platelets: 113 10*3/uL — ABNORMAL LOW (ref 150–400)
RBC: 4.97 MIL/uL (ref 4.22–5.81)
RDW: 15.6 % — ABNORMAL HIGH (ref 11.5–15.5)
WBC: 8.7 10*3/uL (ref 4.0–10.5)

## 2015-07-06 MED ORDER — DILTIAZEM HCL ER COATED BEADS 300 MG PO TB24: 300.0000 mg | ORAL_TABLET | Freq: Every day | ORAL | Status: DC

## 2015-07-06 MED ORDER — FUROSEMIDE 20 MG PO TABS: 20.0000 mg | ORAL_TABLET | Freq: Every day | ORAL | Status: DC

## 2015-07-06 MED ORDER — LORAZEPAM 1 MG PO TABS
1.0000 mg | ORAL_TABLET | Freq: Once | ORAL | Status: AC
  Administered 2015-07-06: 1 mg via ORAL
  Filled 2015-07-06: qty 1

## 2015-07-06 MED ORDER — PROMETHAZINE HCL 12.5 MG PO TABS
12.5000 mg | ORAL_TABLET | Freq: Once | ORAL | Status: AC
  Administered 2015-07-06: 12.5 mg via ORAL
  Filled 2015-07-06: qty 1

## 2015-07-06 MED ORDER — FUROSEMIDE 10 MG/ML IJ SOLN
20.0000 mg | Freq: Once | INTRAMUSCULAR | Status: AC
  Administered 2015-07-06: 20 mg via INTRAVENOUS

## 2015-07-06 NOTE — Discharge Summary (Signed)
Name: Jeremy Rush MRN: [id number] DOB: 05-06-1951 64 y.o. PCP: No Pcp Per Patient  Date of Admission: 07/04/2015  1:48 AM Date of Discharge: 07/06/2015 Attending Physician: Oval Linsey, MD  Discharge Diagnosis: 1. Atrial fibrillation with Rapid Ventricular Rate 2. Chronic, non-healing abdominal wound    Discharge Medications:   Medication List    TAKE these medications        acetaminophen 500 MG tablet  Commonly known as:  TYLENOL  Take 500 mg by mouth every 6 (six) hours as needed for pain.     diltiazem 300 MG 24 hr tablet  Commonly known as:  CARDIZEM LA  Take 1 tablet (300 mg total) by mouth daily.     furosemide 20 MG tablet  Commonly known as:  LASIX  Take 1 tablet daily     lisinopril 10 MG tablet  Commonly known as:  PRINIVIL,ZESTRIL  TAKE 1 TABLET (10 MG TOTAL) BY MOUTH DAILY.     loratadine 10 MG tablet  Commonly known as:  CLARITIN  TAKE 1 TABLET BY MOUTH DAILY     rivaroxaban 20 MG Tabs tablet  Commonly known as:  XARELTO  TAKE 1 TABLET (20 MG TOTAL) BY MOUTH DAILY.        Disposition and follow-up:   Jeremy Rush was discharged from [facility] in Stable condition.  At the hospital follow up visit please address:  1.  A-fib, reassess adequate rate control on new dose of Cardizem LA 346m 2.  CHF, reassess fluid status on 251mPO Lasix daily 3.  Chronic abdominal wound managment  4.  Labs / imaging needed at time of follow-up: None  5.  Pending labs/ test needing follow-up: Blood and Urine cultures  Follow-up Appointments: Follow-up Information    [facility].   Specialty:  General Surgery   Why:  For wound re-check   Contact information:   [phone]     Follow up with KaJory SimsNP On 07/13/2015.   Specialties:  Nurse Practitioner, Radiology, Cardiology   Why:  @ 1:10pm, for hospital fiollow-up.   Contact  information:   [phone]       Discharge Instructions: You were hospitalized due to an exacerbation of your irregular heart rhythm. We have treated you with medication in order to bring your heart rate down, which appears to have been successful. At home, we want you to continue to take all of your medications as before. The only change is that we have prescribed a higher dose of the Cardizem LA (long-acting diltiazem) at 30046m tablet every day.  You should follow-up with your Cardiologist in order to continue to monitor your heart rate and rhythm. We have scheduled this appointment for you in 1 week. You should also keep your appointment with Dr. NewLucia Gaskins monitor the wound in your abdomen.   Consultations:   Cardiology General Surgery  Procedures Performed:  Dg Chest Port 1 View  07/04/2015   CLINICAL DATA:  Shortness of breath.  EXAM: PORTABLE CHEST - 1 VIEW  COMPARISON:  One-view chest x-ray 11/10/2014.  FINDINGS: The heart is enlarged. Mild edema is present. No focal airspace consolidation is present.  IMPRESSION: Cardiomegaly and mild edema.   Electronically Signed   By: ChrSan MorelleD.   On: 07/04/2015 03:03   Ct Angio Chest Aorta W/cm &/or  Wo/cm  07/04/2015   CLINICAL DATA:  Acute onset of generalized body aches and weakness. Chills. Initial encounter.  EXAM: CT ANGIOGRAPHY CHEST WITH CONTRAST  TECHNIQUE: Multidetector CT imaging of the chest was performed using the standard protocol during bolus administration of intravenous contrast. Multiplanar CT image reconstructions and MIPs were obtained to evaluate the vascular anatomy.  CONTRAST:  149m OMNIPAQUE IOHEXOL 350 MG/ML SOLN  COMPARISON:  Chest radiograph performed earlier today at 2:46 a.m., and CT of the chest performed 02/06/2015  FINDINGS: The ascending thoracic aorta measures 5.0 cm in AP dimension. This is stable from the prior study and does not appear unusually prominent for  the patient's size. No aortic dissection is seen.  There is no evidence of central pulmonary embolus.  Minimal bilateral atelectasis is noted. The lungs are otherwise clear. There is no evidence of significant focal consolidation, pleural effusion or pneumothorax. No masses are identified; no abnormal focal contrast enhancement is seen.  The mediastinum is unremarkable in appearance. No mediastinal lymphadenopathy is seen. No pericardial effusion is identified. The great vessels are grossly unremarkable in appearance. No axillary lymphadenopathy is seen. The visualized portions of the thyroid gland are unremarkable in appearance.  The visualized portions of the liver and spleen are unremarkable. The visualized portions of the pancreas, gallbladder, stomach, adrenal glands and kidneys are within normal limits.  No acute osseous abnormalities are seen.  Review of the MIP images confirms the above findings.  IMPRESSION: 1. No evidence of aortic dissection. 2. No evidence of central pulmonary embolus. 3. Ascending thoracic aorta measures 5.0 cm in AP dimension. This is stable from the prior study and does not appear unusually prominent for the patient's size. 4. Minimal bilateral atelectasis noted.  Lungs otherwise clear.   Electronically Signed   By: JGarald BaldingM.D.   On: 07/04/2015 05:34   Admission HPI:  Jeremy Rush a 64y.o. male with a h/o of CHF, HTN, A-fib on anticoagulation, and non-healing abdominal wound who presents with complaints of subjective fevers, HA, and feeling sick. He reports that his symptoms started last night around 9pm and worsened through the night preventing him from sleeping. He endorses some chills and sweats, but did not check his temperature. He denies CP/SOB. He also complains of some nausea, but denies vomiting. He denies diarrhea/constipation and his last BM was in the ER which was normal. He reports frequent urination which is usual due to his lasix, but denies  dysuria or changes in his urine color/smell. He denies any new chest palpitations or feeling like his heart is racing, but reports that his heart rhythm is chronically abnormal. He took all of his home medications yesterday (lisinopril, lasix, diltiazem, xarelto) but did not take anything else since the onset of his symptoms.  He originally presented to OSH and was found to be in a-fib with RVR. He was also found to be febrile to 38.2 C with soft BPs in the 100s/70s. He has a chronically non-healing abdominal wound from [facility] 2002 [facility]. He reports that the wound is chronically draining and endorses a small amount of bleeding daily, but denies any pain or abdominal tenderness. [facility]/ [facility].  Upon transfer, he was found to be hypotensive down to 52/39 with HR from 150-175, but remained asymptomatic and alert. He was treated with IVF. Cardiology was consulted due to instability and  cardioversion was attempted twice without successful rhythm conversion. However, following cardioversion, his rate responded to the 90s. His initial work-up revealed a lactic acid of 2.23 which trended up to 2.77, but did not show evidence of leukocytosis (WBC 9.2). Urinalysis was non-contributory. Urine cx and blood cx were sent for analysis. A CT was obtained which showed no evidence of intra-pulmonary process or PE.  Hospital Course by problem list: A-fib with RVR Jeremy Rush initially presented with chronic atrial fibrillation which had destabilized with tachycardia to the 170s and hypotension. Cardiology was consulted and he was cardioverted twice in the ED without successful rhythm conversion. However, following cardioversion, his rate decreased to the 90s and his hypotension responded with IV fluids. His home dose of diltiazem LA $RemoveBefo'240mg'jtKkFfwrvtk$  was increased to $RemoveBefo'90mg'vWstkKMiIvS$  TID and he continued to have good rate control  with HR in the 90s, despite occasional episodes of asymptomatic tachycardia to the 150s. BP remained stable throughout the rest of his hospitalization. IV fluids were discontinued on HD#3 due to increasing lower extremity edema, and his diuretic therapy was restarted with a single dose of $Remov'20mg'FGNMVj$  IV Lasix followed by daily PO Lasix per his previous home regimen. The initial insult which triggered his rapid ventricular response remains unclear, but our work-up to date has not revealed any signs of systemic infection, other than his chronic, non-healing abdominal wound discussed below.   Cardizem was increased to $RemoveBefo'300mg'RCzWVeLFDfE$  at discharge given a few episodes of HR to the 170s earlier in the AM.  He was hemodynamically stable and without sign of infection on discharge day.  He will continue on Xarelto for A/C.  He will follow-up with cardiology on 08/22 for further management of Afib.  Chronic, non-healing abdominal wound Jeremy Rush underwent a ventral hernia repair in 1999 which has never healed. He has seen Dr. Lucia [facility]ins at [facility] every 6 months for serial debridements. On presentation this admission, he was febrile to 100.26F, with a lactic acidosis, and subjective chills and he met SIRS criteria. His wound was draining a yellowish fluid that he reports is normal.  He also denied any acute changes in pain, erythema, or discharge. Surgery was consulted and initially recommended and abdominal/pelvic CT scan which the patient refused due to claustrophobia. Surgery continued to follow the patient and by discharge, the patient's fever had resolved. It was felt that the lactic acidosis may be best explained by hypoperfusion secondary to decompensated a-fib with RVR. Given his improved clinical status, Surgery advised that his symptoms were consistent with a chronic infection and recommended that the patient have outpatient follow-up.  He will follow-up with surgery on 09/08.  CHF Jeremy Rush showed no signs of  decompensation of his heart failure on this admission. His last echo showed 45-50%, and it was felt that in the setting of his a-fib he would tolerate IV fluid rescusitation which was continued for 2 days. By HD#3, he demonstrated slightly increased LE edema and increased pulmonary wheezes, and he was restarted on his Lasix therapy with good diuresis.  He also maintained oxygen saturation while ambulating without oxygen supplement.  He will continue home po Lasix and follow-up with cardiology as above.   Discharge Vitals:   BP 116/78 mmHg  Pulse 94  Temp(Src) 98 F (36.7 C) (Oral)  Resp 22  Ht $R'5\' 8"'JF$  (1.727 m)  Wt 169.781 kg (374 lb 4.8 oz)  BMI 56.93 kg/m2  SpO2 94%  Discharge Labs:  Results for orders placed or performed during the hospital  encounter of 07/04/15 (from the past 24 hour(s))  Basic metabolic panel     Status: Abnormal   Collection Time: 07/05/15  1:35 PM  Result Value Ref Range   Sodium 137 135 - 145 mmol/L   Potassium 4.0 3.5 - 5.1 mmol/L   Chloride 105 101 - 111 mmol/L   CO2 23 22 - 32 mmol/L   Glucose, Bld 97 65 - 99 mg/dL   BUN 16 6 - 20 mg/dL   Creatinine, Ser 1.20 0.61 - 1.24 mg/dL   Calcium 8.3 (L) 8.9 - 10.3 mg/dL   GFR calc non Af Amer >60 >60 mL/min   GFR calc Af Amer >60 >60 mL/min   Anion gap 9 5 - 15  CBC with Differential/Platelet     Status: Abnormal   Collection Time: 07/06/15  3:40 AM  Result Value Ref Range   WBC 8.7 4.0 - 10.5 K/uL   RBC 4.97 4.22 - 5.81 MIL/uL   Hemoglobin 13.5 13.0 - 17.0 g/dL   HCT 41.5 39.0 - 52.0 %   MCV 83.5 78.0 - 100.0 fL   MCH 27.2 26.0 - 34.0 pg   MCHC 32.5 30.0 - 36.0 g/dL   RDW 15.6 (H) 11.5 - 15.5 %   Platelets 113 (L) 150 - 400 K/uL   Neutrophils Relative % 82 (H) 43 - 77 %   Neutro Abs 7.1 1.7 - 7.7 K/uL   Lymphocytes Relative 7 (L) 12 - 46 %   Lymphs Abs 0.6 (L) 0.7 - 4.0 K/uL   Monocytes Relative 10 3 - 12 %   Monocytes Absolute 0.9 0.1 - 1.0 K/uL   Eosinophils Relative 1 0 - 5 %   Eosinophils Absolute  0.1 0.0 - 0.7 K/uL   Basophils Relative 0 0 - 1 %   Basophils Absolute 0.0 0.0 - 0.1 K/uL    Signed: Duwaine Maxin, DO  07/06/2015, 10:52 AM    Services Ordered on Discharge: None Equipment Ordered on Discharge: None

## 2015-07-06 NOTE — Progress Notes (Signed)
At 1515 introduced self to pt as in coming nurse 3-7p.  Call bell at reach.  Instructed to call for assistance.  Verbalized understanidng.  Amanda Pea, Charity fundraiser.

## 2015-07-06 NOTE — Progress Notes (Signed)
Subjective: No chest pain no SOB  Objective: Vital signs in last 24 hours: Temp:  [97.3 F (36.3 C)-98.5 F (36.9 C)] 98 F (36.7 C) (08/15 0520) Pulse Rate:  [66-94] 94 (08/15 0520) Resp:  [22-30] 22 (08/15 0520) BP: (116-148)/(70-109) 116/78 mmHg (08/15 0520) SpO2:  [94 %-98 %] 94 % (08/15 0520) Weight:  [374 lb 4.8 oz (169.781 kg)-414 lb 6.4 oz (187.971 kg)] 374 lb 4.8 oz (169.781 kg) (08/15 0520) Weight change: -8.1 oz (-0.229 kg) Last BM Date: 07/05/15 Intake/Output from previous day: 08/14 0701 - 08/15 0700 In: 2247.9 [P.O.:600; I.V.:497.9; IV Piggyback:1150] Out: 625 [Urine:625] Intake/Output this shift:    PE: General:Pleasant affect, NAD Skin:Warm and dry, brisk capillary refill HEENT:normocephalic, sclera clear, mucus membranes moist Heart:irreg irreg without murmur, gallup, rub or click Lungs:clear without rales, rhonchi, or wheezes ZOX:WRUE, non tender, + BS, do not palpate liver spleen or masses Ext:2+ lower ext edema, 2+ radial pulses Neuro:alert and oriented X 3, MAE, follows commands, + facial symmetry  Tele:  A fib mostly rate controlled, occ high rate episodes that are brief.  Lab Results:  Recent Labs  07/05/15 0755 07/06/15 0340  WBC 10.0 8.7  HGB 13.9 13.5  HCT 42.5 41.5  PLT 93* 113*   BMET  Recent Labs  07/05/15 0755 07/05/15 1335  NA 136 137  K 5.6* 4.0  CL 110 105  CO2 17* 23  GLUCOSE 80 97  BUN 17 16  CREATININE 1.18 1.20  CALCIUM 7.9* 8.3*    Recent Labs  07/04/15 0240  TROPONINI <0.03    Lab Results  Component Value Date   CHOL  01/14/2008    115        ATP III CLASSIFICATION:  <200     mg/dL   Desirable  454-098  mg/dL   Borderline High  >=119    mg/dL   High   HDL 21* 14/78/2956   LDLCALC  01/14/2008    86        Total Cholesterol/HDL:CHD Risk Coronary Heart Disease Risk Table                     Men   Women  1/2 Average Risk   3.4   3.3   TRIG 42 01/14/2008   CHOLHDL 5.5 01/14/2008   No  results found for: HGBA1C   Lab Results  Component Value Date   TSH 1.918 01/16/2013    Hepatic Function Panel  Recent Labs  07/04/15 0240  PROT 6.8  ALBUMIN 3.5  AST 19  ALT 12*  ALKPHOS 43  BILITOT 1.1   No results for input(s): CHOL in the last 72 hours. No results for input(s): PROTIME in the last 72 hours.     Studies/Results: No results found.  Medications: I have reviewed the patient's current medications. Scheduled Meds: . diltiazem  90 mg Oral 3 times per day  . loratadine  10 mg Oral Daily  . piperacillin-tazobactam (ZOSYN)  IV  3.375 g Intravenous 3 times per day  . rivaroxaban  20 mg Oral Q supper  . sodium chloride  3 mL Intravenous Q12H  . vancomycin  1,500 mg Intravenous Q12H   Continuous Infusions:  PRN Meds:.acetaminophen  Assessment/Plan:   64 year old male with morbid obesity, aortic root enlargement, essential hypertension, normal left ventricular systolic function with reduced RV systolic function on recent echocardiogram who was transferred from [facility] with his chronic atrial fibrillation, now  with rapid ventricular response and possible sepsis  Also with morbid obesity, abdominal wound, right upper quadrant pain, fever, hypotension, atrial fibrillation with rapid ventricular response, chronic anticoagulation.  Principal Problem:   SIRS (systemic inflammatory response syndrome) on ABX Active Problems:   Wound infection, chronic anterior abdominal abscess.   Morbid obesity   Hypertension- was hypotensive on admit.   Atrial fibrillation-now on po dilt 90 mg 3 times a day.  At home was on dilt 240 mg -he has failed DCCV twice in the past.  Chronic a fib with RVR do to illness on admit.  ?BP improved increase his dilt to 240.     Chronic anticoagulation- xarelto    Hx CHF (congestive heart failure) with lower ext edema yesterday. Increasing edema though wt is down not sure how accurate --- his I&O is positive almost 3 L . May need to  resume his lasix.     LOS: 2 days   Time spent with pt. : 15 minutes. [facility]  Nurse Practitioner Certified Pager [phone] or after 5pm and on weekends call [phone] 07/06/2015, 8:03 AM   Patient examined chart reviewed Agree with assessment above. Telemetry review shows rate controlled Afib. Morbidly obese with LE edema.  Resume home dose of lasix 20 mg  Continue cardizem 90 tid for now Continue anticoagulation with xarelto  Charlton Haws

## 2015-07-06 NOTE — Progress Notes (Signed)
Subjective: Jeremy Rush was seen and examined this AM.  He is feeling a little better, ate breakfast, but says he still feels fatigued.  When I returned later to round with the team, he seemed more improved, laughing and joking with the team.    Objective: Vital signs in last 24 hours: Filed Vitals:   07/05/15 1900 07/05/15 1929 07/06/15 0139 07/06/15 0520  BP: 148/109 142/82 141/99 116/78  Pulse: 82  66 94  Temp: 98 F (36.7 C)  98.1 F (36.7 C) 98 F (36.7 C)  TempSrc: Oral  Oral Oral  Resp: Height:  (1.727 m)     Weight: 414 lb 6.4 oz (187.971 kg)   374 lb 4.8 oz (169.781 kg)  SpO2: 98%  96% 94%   Weight change: -8.1 oz (-0.229 kg)  Intake/Output Summary (Last 24 hours) at 07/06/15 0737 Last data filed at 07/06/15 0524  Gross per 24 hour  Intake 2247.92 ml  Output    625 ml  Net 1622.92 ml   General: sitting up in chair in NAD HEENT: [place]/AT Cardiac: irregularly irregular, no rubs, murmurs or gallops Pulm: difficult lung exam due to body habitus, + mild wheezes, no rales or rhonchi, no accessory muscle use or respiratory distress Abd: large abdominal girth, soft, nondistended, nontender, BS present, bandage c/d/i and removed to inspect the 9x4cm erythematous wound with small opening with small amount of light green drainage, directly above the wound is a large red plaque like rash Ext: warm and well perfused, 2+ pedal edema, venous stasis skin changes Neuro: alert and oriented X3, responding appropriately, MAE spontaneously  Lab Results: Basic Metabolic Panel:  Recent Labs Lab 07/05/15 0755 07/05/15 1335  NA 136 137  K 5.6* 4.0  CL 110 105  CO2 17* 23  GLUCOSE 80 97  BUN 17 16  CREATININE 1.18 1.20  CALCIUM 7.9* 8.3*   CBC:  Recent Labs Lab 07/04/15 0240 07/05/15 0755 07/06/15 0340  WBC 9.2 10.0 8.7  NEUTROABS 8.7*  --  7.1  HGB 13.8 13.9 13.5  HCT 43.4 42.5 41.5  MCV 84.1 82.2 83.5  PLT 147* 93* 113*   Medications: I have  reviewed the patient's current medications. Scheduled Meds: . diltiazem  90 mg Oral 3 times per day  . loratadine  10 mg Oral Daily  . piperacillin-tazobactam (ZOSYN)  IV  3.375 g Intravenous 3 times per day  . rivaroxaban  20 mg Oral Q supper  . sodium chloride  3 mL Intravenous Q12H  . vancomycin  1,500 mg Intravenous Q12H   Continuous Infusions: none PRN Meds:.acetaminophen   Assessment/Plan: SIRS in the setting of chronic abdominal wound from mesh hernia repair:  He is hemodynamically stable but had some episodes of tachy into the 170s on telemetry.  Surgery feels this is chronic, no acute infection, advised outpatient follow-up.  Blood cx/Urine cx NGTD. - d/c IV abx - [employer] for wound care - ambulate w/ RN and check SpO2 w and w/o oxygen - if he is doing well this afternoon, will plan to d/c home; otherwise, reassess in AM - he has a surgery appt scheduled next month (will see if we can move it up)  Atrial fibrillation:  Rates better this AM but had some episodes of RVR into the 170s on telemetry overnight.  Slept sitting up but denies dyspnea.  Appreciate cardiology recommendations and intervention.   - continue Xarelto and Cardizem - will increase Cardizem to  at  d/c; if he stays tonight will add 30mg  (for total of 300mg  today) - continue cardiac monitoring  RV systolic dysfunction:  Legs more swollen and has some wheezing on exam.  Slept sitting up but denies dyspnea.  Up 2.7L this admission due to fluids at admission.  Recorded weight is inconsistent. - IV lasix 20mg  x 1 - monitoring respiratory status, weight, I&Os  Dispo: Disposition is deferred at this time, awaiting improvement of current medical problems.  Anticipated discharge in approximately 0-1 day(s).   The patient does not have a current PCP (No Pcp Per Patient) and does not need an [facility] hospital follow-up appointment after discharge.  The patient does not know have transportation limitations that hinder  transportation to clinic appointments.  .Services Needed at time of discharge: Y = Yes, Blank = No PT:   OT:   RN:   Equipment:   Other:     LOS: 2 days   Yolanda Manges, DO 07/06/2015, 7:37 AM

## 2015-07-06 NOTE — Progress Notes (Signed)
UR completed 

## 2015-07-06 NOTE — Progress Notes (Signed)
Occupational Therapy Evaluation Patient Details Name: Jeremy Rush MRN: [id number] DOB: [birth year] Today's Date: 07/06/2015    History of Present Illness Mr. 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 He said symptoms started suddenly last night and he was unable to sleep.Febrile in ED sepsis workup underway   Clinical Impression   PTA, pt independent with ADL and mobility. Pt presents with deficits noted below and will benefit form acute OT services to maximize safety and independence with ADL and functional mobility for ADL to facilitate safe D/C home. Will assess use of AE for LB ADL and educate on energy conservation next visit.     Follow Up Recommendations  No OT follow up;Supervision - Intermittent    Equipment Recommendations  None recommended by OT    Recommendations for Other Services       Precautions / Restrictions Precautions Precaution Comments: watch HR with activity Restrictions Weight Bearing Restrictions: No      Mobility Bed Mobility               General bed mobility comments: Pt OOB in chair  Transfers Overall transfer level: Needs assistance Equipment used: 1 person hand held assist Transfers: Sit to/from Stand Sit to Stand: Supervision         General transfer comment: no assistnace required    Balance Overall balance assessment: Needs assistance   Sitting balance-Leahy Scale: Good       Standing balance-Leahy Scale: Fair Standing balance comment: using furniture walking. No hx of falls.                            ADL Overall ADL's : Needs assistance/impaired                                     Functional mobility during ADLs: Supervision/safety General ADL Comments: Overall set up for ADL. Discussed  available AE for ADL and posible use of shower chair. Pt staes he is not interested ina showerchair at this time. Feel pt would benefit from seeing AE to determine if would assist him. Also would benefit from energy conservation.     Vision     Perception     Praxis      Pertinent Vitals/Pain Pain Assessment: No/denies pain     Hand Dominance     Extremity/Trunk Assessment Upper Extremity Assessment Upper Extremity Assessment: Generalized weakness   Lower Extremity Assessment Lower Extremity Assessment: Generalized weakness   Cervical / Trunk Assessment Cervical / Trunk Assessment: Normal   Communication Communication Communication: No difficulties   Cognition Arousal/Alertness: Awake/alert Behavior During Therapy: WFL for tasks assessed/performed Overall Cognitive Status: Within Functional Limits for tasks assessed                     General Comments       Exercises       Shoulder Instructions      Home Living Family/patient expects to be discharged to:: Private residence Living Arrangements: Other relatives (Sister) Available Help at Discharge: Other (Comment) (Sister can't provide assist) Type of Home: House Home Access: Stairs to enter;Ramped entrance Entrance Stairs-Number of Steps: 1   Home Layout:  One level     Bathroom Shower/Tub: Producer, television/film/video: Standard Bathroom Accessibility: Yes How Accessible: Accessible via walker Home Equipment: None          Prior Functioning/Environment Level of Independence: Independent             OT Diagnosis: Generalized weakness   OT Problem List: Decreased strength;Decreased activity tolerance;Decreased knowledge of use of DME or AE;Cardiopulmonary status limiting activity;Obesity;Increased edema   OT Treatment/Interventions: Self-care/ADL training;Energy conservation;DME and/or AE instruction;Patient/family education;Therapeutic activities    OT Goals(Current goals can be  found in the care plan section) Acute Rehab OT Goals Patient Stated Goal: Hopes to feel better soon OT Goal Formulation: With patient Time For Goal Achievement: 07/20/15 Potential to Achieve Goals: Good ADL Goals Pt Will Perform Lower Body Bathing: with modified independence;sit to/from stand Pt Will Perform Lower Body Dressing: with modified independence;sit to/from stand Pt Will Transfer to Toilet: with modified independence;ambulating Pt Will Perform Toileting - Clothing Manipulation and hygiene: with modified independence;sit to/from stand Additional ADL Goal #1: Pt will verbalize use of 3 energy conservation techniques for ADL  OT Frequency: Min 2X/week   Barriers to D/C:            Co-evaluation              End of Session Nurse Communication: Mobility status  Activity Tolerance: Patient tolerated treatment well Patient left: in chair;with call bell/phone within reach   Time: 1610-9604 OT Time Calculation (min): 16 min Charges:  OT General Charges $OT Visit: 1 Procedure OT Evaluation $Initial OT Evaluation Tier I: 1 Procedure G-Codes:    Jeremy Rush,Jeremy Rush 07-27-15, 11:14 AM   Jeremy Rush, OTR/L  [phone] 07-27-15

## 2015-07-06 NOTE — Progress Notes (Signed)
Patient ID: Jeremy Rush, male   DOB: [birth year], 64 y.o.   MRN: [id number]  This is a chronic wound issue.  No indications for acute intervention.  The patient should follow-up with Dr. Ovidio Kin after discharge.  We will sign off.  Wilmon Arms. Corliss Skains, MD, [facility]  General/ Trauma Surgery  07/06/2015 9:09 AM

## 2015-07-06 NOTE — Discharge Instructions (Addendum)
You were hospitalized due to an exacerbation of your irregular heart rhythm. We have treated you with medication in order to bring your heart rate down, which appears to have been successful. At home, we want you to continue to take all of your medications as before. The only change is that we have prescribed a higher dose of the Cardizem LA (long-acting diltiazem) at 300mg  1 tablet every day.  Start taking this medication on 07/07/2015.  You should follow-up with your Cardiologist in order to continue to monitor your heart rate and rhythm. We have scheduled this appointment for you in 1 week. You should also keep your appointment with Dr. Ezzard Standing to monitor the wound in your abdomen.   Atrial Fibrillation Atrial fibrillation is a type of irregular heart rhythm (arrhythmia). During atrial fibrillation, the upper chambers of the heart (atria) quiver continuously in a chaotic pattern. This causes an irregular and often rapid heart rate.  Atrial fibrillation is the result of the heart becoming overloaded with disorganized signals that tell it to beat. These signals are normally released one at a time by a part of the right atrium called the sinoatrial node. They then travel from the atria to the lower chambers of the heart (ventricles), causing the atria and ventricles to contract and pump blood as they pass. In atrial fibrillation, parts of the atria outside of the sinoatrial node also release these signals. This results in two problems. First, the atria receive so many signals that they do not have time to fully contract. Second, the ventricles, which can only receive one signal at a time, beat irregularly and out of rhythm with the atria.  There are three types of atrial fibrillation:   Paroxysmal. Paroxysmal atrial fibrillation starts suddenly and stops on its own within a week.  Persistent. Persistent atrial fibrillation lasts for more than a week. It may stop on its own or with treatment.  Permanent.  Permanent atrial fibrillation does not go away. Episodes of atrial fibrillation may lead to permanent atrial fibrillation. Atrial fibrillation can prevent your heart from pumping blood normally. It increases your risk of stroke and can lead to heart failure.  CAUSES   Heart conditions, including a heart attack, heart failure, coronary artery disease, and heart valve conditions.   Inflammation of the sac that surrounds the heart (pericarditis).  Blockage of an artery in the lungs (pulmonary embolism).  Pneumonia or other infections.  Chronic lung disease.  Thyroid problems, especially if the thyroid is overactive (hyperthyroidism).  Caffeine, excessive alcohol use, and use of some illegal drugs.   Use of some medicines, including certain decongestants and diet pills.  Heart surgery.   Birth defects.  Sometimes, no cause can be found. When this happens, the atrial fibrillation is called lone atrial fibrillation. The risk of complications from atrial fibrillation increases if you have lone atrial fibrillation and you are age 63 years or older. RISK FACTORS  Heart failure.  Coronary artery disease.  Diabetes mellitus.   High blood pressure (hypertension).   Obesity.   Other arrhythmias.   Increased age. SIGNS AND SYMPTOMS   A feeling that your heart is beating rapidly or irregularly.   A feeling of discomfort or pain in your chest.   Shortness of breath.   Sudden light-headedness or weakness.   Getting tired easily when exercising.   Urinating more often than normal (mainly when atrial fibrillation first begins).  In paroxysmal atrial fibrillation, symptoms may start and suddenly stop. DIAGNOSIS  Your health  care provider may be able to detect atrial fibrillation when taking your pulse. Your health care provider may have you take a test called an ambulatory electrocardiogram (ECG). An ECG records your heartbeat patterns over a 24-hour period. You may  also have other tests, such as:  Transthoracic echocardiogram (TTE). During echocardiography, sound waves are used to evaluate how blood flows through your heart.  Transesophageal echocardiogram (TEE).  Stress test. There is more than one type of stress test. If a stress test is needed, ask your health care provider about which type is best for you.  Chest X-ray exam.  Blood tests.  Computed tomography (CT). TREATMENT  Treatment may include:  Treating any underlying conditions. For example, if you have an overactive thyroid, treating the condition may correct atrial fibrillation.  Taking medicine. Medicines may be given to control a rapid heart rate or to prevent blood clots, heart failure, or a stroke.  Having a procedure to correct the rhythm of the heart:  Electrical cardioversion. During electrical cardioversion, a controlled, low-energy shock is delivered to the heart through your skin. If you have chest pain, very low blood pressure, or sudden heart failure, this procedure may need to be done as an emergency.  Catheter ablation. During this procedure, heart tissues that send the signals that cause atrial fibrillation are destroyed.  Surgical ablation. During this surgery, thin lines of heart tissue that carry the abnormal signals are destroyed. This procedure can either be an open-heart surgery or a minimally invasive surgery. With the minimally invasive surgery, small cuts are made to access the heart instead of a large opening.  Pulmonary venous isolation. During this surgery, tissue around the veins that carry blood from the lungs (pulmonary veins) is destroyed. This tissue is thought to carry the abnormal signals. HOME CARE INSTRUCTIONS   Take medicines only as directed by your health care provider. Some medicines can make atrial fibrillation worse or recur.  If blood thinners were prescribed by your health care provider, take them exactly as directed. Too much  blood-thinning medicine can cause bleeding. If you take too little, you will not have the needed protection against stroke and other problems.  Perform blood tests at home if directed by your health care provider. Perform blood tests exactly as directed.  Quit smoking if you smoke.  Do not drink alcohol.  Do not drink caffeinated beverages such as coffee, soda, and some teas. You may drink decaffeinated coffee, soda, or tea.   Maintain a healthy weight.Do not use diet pills unless your health care provider approves. They may make heart problems worse.   Follow diet instructions as directed by your health care provider.  Exercise regularly as directed by your health care provider.  Keep all follow-up visits as directed by your health care provider. This is important. PREVENTION  The following substances can cause atrial fibrillation to recur:   Caffeinated beverages.  Alcohol.  Certain medicines, especially those used for breathing problems.  Certain herbs and herbal medicines, such as those containing ephedra or ginseng.  Illegal drugs, such as cocaine and amphetamines. Sometimes medicines are given to prevent atrial fibrillation from recurring. Proper treatment of any underlying condition is also important in helping prevent recurrence.  SEEK MEDICAL CARE IF:  You notice a change in the rate, rhythm, or strength of your heartbeat.  You suddenly begin urinating more frequently.  You tire more easily when exerting yourself or exercising. SEEK IMMEDIATE MEDICAL CARE IF:   You have chest pain, abdominal  pain, sweating, or weakness.  You feel nauseous.  You have shortness of breath.  You suddenly have swollen feet and ankles.  You feel dizzy.  Your face or limbs feel numb or weak.  You have a change in your vision or speech. MAKE SURE YOU:   Understand these instructions.  Will watch your condition.  Will get help right away if you are not doing well or get  worse. Document Released: 11/07/2005 Document Revised: 03/24/2014 Document Reviewed: 12/18/2012 [facility] Patient Information 2015 [place], [place]. This information is not intended to replace advice given to you by your health care provider. Make sure you discuss any questions you have with your health care provider.

## 2015-07-06 NOTE — Progress Notes (Signed)
Subjective:    Currently, the patient is still feeling generally unwell. He reports that he had one episode of emesis last night, and has decreased appetite and nausea, in spite of being able to eat his pancakes this morning. He notes that he slept in his chair last night because his bed is not comfortable, but states that he only slept for several hours. He also endorses some SOB with activity when he gets up to go to the bathroom. He denies any abdominal pain or other specific complaints.  Interval Events: WBC down to 8.7 from 10.0.   Objective:    Vital Signs:   Temp:  [97.3 F (36.3 C)-98.5 F (36.9 C)] 98 F (36.7 C) (08/15 0520) Pulse Rate:  [66-94] 94 (08/15 0520) Resp:  [22-30] 22 (08/15 0520) BP: (116-148)/(70-109) 116/78 mmHg (08/15 0520) SpO2:  [94 %-98 %] 94 % (08/15 0520) Weight:  [169.781 kg (374 lb 4.8 oz)-187.971 kg (414 lb 6.4 oz)] 169.781 kg (374 lb 4.8 oz) (08/15 0520) Last BM Date: 07/05/15  24-hour weight change: Weight change: -0.229 kg (-8.1 oz)  Intake/Output:   Intake/Output Summary (Last 24 hours) at 07/06/15 0848 Last data filed at 07/06/15 0524  Gross per 24 hour  Intake 2247.92 ml  Output    375 ml  Net 1872.92 ml      Physical Exam: General: Vital signs reviewed and noted. Well-developed, well-nourished, in no acute distress; alert, appropriate and cooperative throughout examination.  Lungs:  Normal respiratory effort. Clear to auscultation BL without crackles or wheezes.  Heart: RRR. S1 and S2 normal without gallop, murmur, or rubs.  Abdomen:  Morbidly obese, non-tender, bowel sounds active, no masses, no organomegaly, no rebound/guarding, 9x4 cm erythematous abdominal wound draining purulent discharge from a pinpoint tract, the base of which could not be visualized, the wound was non-tender to palpation, adjacent ~6x6 cm erythematous plague with hard scaley papules which was non-pruritic, non-tender  Extremities: Dark venous stasis changes  in BLE, tenderness and 2+ pitting edema BLE, calves without focal tenderness     Labs:  Basic Metabolic Panel:  Recent Labs Lab 07/04/15 0240 07/05/15 0755 07/05/15 1335  NA 138 136 137  K 3.6 5.6* 4.0  CL 105 110 105  CO2 24 17* 23  GLUCOSE 103* 80 97  BUN 25* 17 16  CREATININE 1.32* 1.18 1.20  CALCIUM 8.4* 7.9* 8.3*    Liver Function Tests:  Recent Labs Lab 07/04/15 0240  AST 19  ALT 12*  ALKPHOS 43  BILITOT 1.1  PROT 6.8  ALBUMIN 3.5   CBC:  Recent Labs Lab 07/04/15 0240 07/05/15 0755 07/06/15 0340  WBC 9.2 10.0 8.7  NEUTROABS 8.7*  --  7.1  HGB 13.8 13.9 13.5  HCT 43.4 42.5 41.5  MCV 84.1 82.2 83.5  PLT 147* 93* 113*    Cardiac Enzymes:  Recent Labs Lab 07/04/15 0240  TROPONINI <0.03    BNP: Recent Labs     07/04/15  0240  BNP  123.0*    Microbiology: Results for orders placed or performed during the hospital encounter of 07/04/15  Blood Culture (routine x 2)     Status: None (Preliminary result)   Collection Time: 07/04/15  2:40 AM  Result Value Ref Range Status   Specimen Description LEFT ANTECUBITAL  Final   Special Requests BOTTLES DRAWN AEROBIC ONLY 6CC DRAWN BY RN  Final   Culture NO GROWTH 1 DAY  Final   Report Status PENDING  Incomplete  Blood Culture (  routine x 2)     Status: None (Preliminary result)   Collection Time: 07/04/15  2:45 AM  Result Value Ref Range Status   Specimen Description BLOOD RIGHT HAND  Final   Special Requests BOTTLES DRAWN AEROBIC AND ANAEROBIC 6CC  Final   Culture NO GROWTH 1 DAY  Final   Report Status PENDING  Incomplete  Urine culture     Status: None   Collection Time: 07/04/15  7:00 AM  Result Value Ref Range Status   Specimen Description URINE, CLEAN CATCH  Final   Special Requests NONE  Final   Culture NO GROWTH 1 DAY  Final   Report Status 07/05/2015 FINAL  Final  MRSA PCR Screening     Status: None   Collection Time: 07/04/15  1:54 PM  Result Value Ref Range Status   MRSA by PCR  NEGATIVE NEGATIVE Final    Comment:        The GeneXpert MRSA Assay (FDA approved for NASAL specimens only), is one component of a comprehensive MRSA colonization surveillance program. It is not intended to diagnose MRSA infection nor to guide or monitor treatment for MRSA infections.     Coagulation Studies:  Recent Labs  07/04/15 0240  LABPROT 20.2*  INR 1.72*    Imaging: No results found.     Medications:    Infusions:    Scheduled Medications: . diltiazem  90 mg Oral 3 times per day  . loratadine  10 mg Oral Daily  . piperacillin-tazobactam (ZOSYN)  IV  3.375 g Intravenous 3 times per day  . rivaroxaban  20 mg Oral Q supper  . sodium chloride  3 mL Intravenous Q12H  . vancomycin  1,500 mg Intravenous Q12H    PRN Medications: acetaminophen   Assessment/ Plan:    Jeremy Rush is a 64 y.o. male with h/o of CHF, HTN, A-fib on anticoagulation, and non-healing abdominal wound who presents for evaluation of A-fib with RVR and possible sepsis.  1) A-fib with RVR: Currently stable with HR 90s. Recently unstable BPs with uncontrolled HR in the 170s. Likely chronic a-fib, since at least March 2016 (documented on cardiology clinic note). s/p cardioversion x2 without rhythm conversion, but rate control achieved. Has been on anticoagulation with Xarelto for years. CHA2DS2-VASc score 2. Recent instability likely secondary to acute infection. Will continue rate control with diltiazem and supportive therapy while treating infection. Plan for reassessment, with possible transition to amiodarone if necessary. -- plan for DC today -- Cardiology consulted, appreciate recs: - Continue dilt 90 TID - Restart Lasix 20mg  qD -- Discharge on Cardizem LA 300mg  -- Continue Xarelto 20mg  qD  2) SIRS: Now afebrile. Possibly 2/2 exacerbation of a-fib w/ RVR. Surgery is unconcerned about abdominal wound, advising this is a chronic infection not warranting  acute intervention. Will discontinue antibiotics and plan for outpatient follow-up. -- Surgery consulted, awaiting recs - non-acute, outpt f/u - sign off -- d/c abx -- f/u BCx and UCx - NGx2d  3) CHF: BNP 123. Last EF 45-50%. Currently stable with increase in cardiac wheeze and LE edema. -- IV Lasix 20mg  once -- Restart Lasix 20mg  PO at DC  4) HTN: BP stabilized. Restart home ACE-I tomorrow.  5) AKI: Resolved with IVF.  6) Abdominal skin rash: Reportedly chronic. Erythematous plaque like rash with scaley papules. Non-tender, non-pruritic. Possible related to non-healing abdominal wound given proximity. Will continue to monitor for now.  7) Back pain: Chronic, paraspinal back pain, worsened with lying supine.  No bruising, or rash. APAP PRN pain  FEN/GI - PO hydration - Reg diet   DVT PPX - Xarelto  CODE STATUS - Full  CONSULTS PLACED - Surg, Cards  DISPO - Discharge today given stabilization of a-fib rate.   Anticipated discharge in approximately 0 day(s).   The patient does have a current PCP (No PCP Per Patient) and does need an [facility] hospital follow-up appointment after discharge.    Is the [facility] hospital follow-up appointment a one-time only appointment? no.  Does the patient have transportation limitations that hinder transportation to clinic appointments? no   SERVICE NEEDED AT DISCHARGE - TO BE DETERMINED DURING HOSPITAL COURSE         Y = Yes, Blank = No PT:   OT:   RN:   Equipment:   Other:      Length of Stay: 2 day(s)   This is a Psychologist, occupational Note.  The care of the patient was discussed with Dr. Andrey Campanile and the assessment and plan formulated with their assistance.  Please see their attached note for official documentation of the daily encounter.  Burna Cash, MS4 Pager: [phone] (7AM-5PM) 07/06/2015, 8:48 AM

## 2015-07-06 NOTE — Progress Notes (Signed)
SATURATION QUALIFICATIONS: (This note is used to comply with regulatory documentation for home oxygen)  Patient Saturations on Room Air at Rest = 94%  Patient Saturations on Room Air while Ambulating = 94%  Patient Saturations on 2 Liters of oxygen while Ambulating = 91- 94%  Please briefly explain why patient needs home oxygen:

## 2015-07-06 NOTE — Consult Note (Addendum)
  WOC consult was requested for abd wound prior to surgical team assessment.  Dr Luisa Hart has assessed patient who is familiar to their team and recommended Aqaucel for dressing changes.  Please refer to [facility] team for further plan of care. Please re-consult if further assistance is needed.  Thank-you,  Cammie Mcgee MSN, RN, CWOCN, [place], CNS [phone]

## 2015-07-08 ENCOUNTER — Telehealth: Payer: Self-pay | Admitting: *Deleted

## 2015-07-08 ENCOUNTER — Telehealth: Payer: Self-pay | Admitting: Internal Medicine

## 2015-07-08 NOTE — Telephone Encounter (Signed)
Called from hospitalist on call regarding positive blood culture from [facility]. Reviewed blood culture results, appears patient had 1/2 blood cultures positive for gram negative rods (aerobic bottle). In the setting of a chronic abdominal wound and initial presentation of fever, mild hypotension, tachycardia, and lactic acidosis on 07/04/15, called patient to recommend he return to the ED for re-evaluation w/ repeat labs, blood cultures and possible antibiotics. Patient quite uninterested in readmission as he states he feels fine since his discharge from the hospital. Explained the serious nature of possible bacteremia, he understands, states he will consider coming back to the hospital for ED evaluation.   Lauris Chroman, MD PGY-3, Internal Medicine Pager: [phone]

## 2015-07-09 ENCOUNTER — Emergency Department (HOSPITAL_COMMUNITY)
Admission: EM | Admit: 2015-07-09 | Discharge: 2015-07-09 | Disposition: A | Discharge status: Home / Self Care | Payer: Medicaid Other | Attending: Emergency Medicine | Admitting: Emergency Medicine

## 2015-07-09 ENCOUNTER — Encounter (HOSPITAL_COMMUNITY): Payer: Self-pay | Admitting: Emergency Medicine

## 2015-07-09 DIAGNOSIS — Z8739 Personal history of other diseases of the musculoskeletal system and connective tissue: Secondary | ICD-10-CM | POA: Insufficient documentation

## 2015-07-09 DIAGNOSIS — R7989 Other specified abnormal findings of blood chemistry: Secondary | ICD-10-CM | POA: Diagnosis present

## 2015-07-09 DIAGNOSIS — I10 Essential (primary) hypertension: Secondary | ICD-10-CM | POA: Insufficient documentation

## 2015-07-09 DIAGNOSIS — Z79899 Other long term (current) drug therapy: Secondary | ICD-10-CM | POA: Insufficient documentation

## 2015-07-09 DIAGNOSIS — R7881 Bacteremia: Secondary | ICD-10-CM

## 2015-07-09 LAB — CULTURE, BLOOD (ROUTINE X 2): CULTURE: NO GROWTH

## 2015-07-09 NOTE — ED Notes (Signed)
Pt reports he was called by Dr. Les Pou office and told to have blood cultures drawn. Bacteria found in labs.

## 2015-07-09 NOTE — ED Provider Notes (Signed)
CSN: [id number]     Arrival date & time 07/09/15  1411 History   First MD Initiated Contact with Patient 07/09/15 1430     Chief Complaint  Patient presents with  . Abnormal Lab     (Consider location/radiation/quality/duration/timing/severity/associated sxs/prior Treatment) HPI  Recently admitted to the hospital secondary to A. fib with RVR. Had blood cultures drawn while in the hospital but came back 5 days later as positive for Escherichia coli in 1 out of 2 samples. Was called and told to come by the emergency department for further evaluation. On review of systems here patient without fever, pain anywhere, headache, lethargy, malaise and actually says he feels much much better than he did when he came into the hospital. Is not on any antibiotics. Has no urinary symptoms, neuro symptoms or GI symptoms. No shortness of breath, or cough that worsened baseline. He does have a wound on his abdomen however he states is not more painful and is not draining anything different than the normal nor is there any change in color around it and he changes the dressings twice a day without difficulty. No history of positive blood cultures.  Past Medical History  Diagnosis Date  . Morbid obesity   . Hypertension   . DJD (degenerative joint disease)     Knees  . Atrial fibrillation 2014    Initial diagnosis in 12/2012  . Hyperlipidemia    Past Surgical History  Procedure Laterality Date  . Ventral hernia repair  1990s-2000    X4  . Abdominal debridement  2009    Dr. Ovidio Kin, infected mesh/abscess  . Wrist surgery      Right   Family History  Problem Relation Age of Onset  . Family history unknown: Yes   Social History  Substance Use Topics  . Smoking status: Never Smoker   . Smokeless tobacco: Never Used  . Alcohol Use: No    Review of Systems  Constitutional: Negative for fever and chills.  Respiratory: Positive for shortness of breath (baseline). Negative for cough.    Cardiovascular: Negative for chest pain and palpitations.  Skin: Positive for wound (Anterior abdomen at baseline).  All other systems reviewed and are negative.     Allergies  Codeine and Morphine and related  Home Medications   Prior to Admission medications   Medication Sig Start Date End Date Taking? Authorizing Provider  diltiazem (CARDIZEM LA) 300 MG 24 hr tablet Take 1 tablet (300 mg total) by mouth daily. 07/06/15  Yes Yolanda Manges, DO  furosemide (LASIX) 20 MG tablet Take 1 tablet daily Patient taking differently: Take 20 mg by mouth daily. Take 1 tablet daily 02/25/15  Yes Laqueta Linden, MD  lisinopril (PRINIVIL,ZESTRIL) 10 MG tablet TAKE 1 TABLET (10 MG TOTAL) BY MOUTH DAILY. 06/22/15  Yes Jodelle Gross, NP  loratadine (CLARITIN) 10 MG tablet TAKE 1 TABLET BY MOUTH DAILY 01/12/15  Yes Jodelle Gross, NP  rivaroxaban (XARELTO) 20 MG TABS tablet TAKE 1 TABLET (20 MG TOTAL) BY MOUTH DAILY. 01/13/15  Yes Laqueta Linden, MD  acetaminophen (TYLENOL) 500 MG tablet Take 500 mg by mouth every 6 (six) hours as needed for pain.    Historical Provider, MD   BP 144/104 mmHg  Pulse 95  Temp(Src) 98.2 F (36.8 C) (Oral)  Resp 16  Ht 5\' 8"  (1.727 m)  Wt 408 lb (185.068 kg)  BMI 62.05 kg/m2  SpO2 94% Physical Exam  Constitutional: He is oriented to person,  place, and time. He appears well-developed and well-nourished.  HENT:  Head: Normocephalic and atraumatic.  Eyes: Conjunctivae and EOM are normal.  Neck: Normal range of motion. Neck supple.  Cardiovascular: Normal rate and regular rhythm.   Pulmonary/Chest: Effort normal. No respiratory distress.  Abdominal: Soft. There is no tenderness.  Small draining wound anterior abdomen with slight erythema around it likely secondary to adhesives. No odor, induration, obvious abscess.  Musculoskeletal: Normal range of motion. He exhibits no edema or tenderness.  Neurological: He is alert and oriented to person, place, and  time.  Skin: Skin is warm and dry.  Nursing note and vitals reviewed.   ED Course  Procedures (including critical care time) Labs Review Labs Reviewed  CULTURE, BLOOD (ROUTINE X 2)  CULTURE, BLOOD (ROUTINE X 2)    Imaging Review No results found. I have personally reviewed and evaluated these images and lab results as part of my medical decision-making.   EKG Interpretation None      MDM   Final diagnoses:  Positive blood culture    64 year old male came here because of a positive blood culture while at an inpatient. Patient without any evidence of systemic illness. We will redraw blood cultures. I don't think he needs antibiotics as an sure what I would be treating. I feel like this one out of 2 cultures that grew 5 days is possibly contaminant. Wound on his abdomen does not appear to be infected as it is at baseline per the patient. She states that he will come back if he starts having any fever, nausea, vomiting, malaise, decreased appetite, abdominal pain or other worrisome symptoms.  I have personally and contemperaneously reviewed labs and imaging and used in my decision making as above.   A medical screening exam was performed and I feel the patient has had an appropriate workup for their chief complaint at this time and likelihood of emergent condition existing is low. They have been counseled on decision, discharge, follow up and which symptoms necessitate immediate return to the emergency department. They or their family verbally stated understanding and agreement with plan and discharged in stable condition.      Marily Memos, MD 07/09/15 2204

## 2015-07-10 LAB — CULTURE, BLOOD (ROUTINE X 2)

## 2015-07-13 ENCOUNTER — Ambulatory Visit (INDEPENDENT_AMBULATORY_CARE_PROVIDER_SITE_OTHER): Payer: Medicaid Other | Admitting: Adult Health

## 2015-07-13 ENCOUNTER — Encounter: Payer: Self-pay | Admitting: Adult Health

## 2015-07-13 VITALS — BP 140/80 | HR 62 | Ht 68.0 in | Wt >= 6400 oz

## 2015-07-13 DIAGNOSIS — I714 Abdominal aortic aneurysm, without rupture, unspecified: Secondary | ICD-10-CM

## 2015-07-13 DIAGNOSIS — I481 Persistent atrial fibrillation: Secondary | ICD-10-CM | POA: Diagnosis not present

## 2015-07-13 DIAGNOSIS — I5032 Chronic diastolic (congestive) heart failure: Secondary | ICD-10-CM | POA: Diagnosis not present

## 2015-07-13 DIAGNOSIS — I4891 Unspecified atrial fibrillation: Secondary | ICD-10-CM

## 2015-07-13 DIAGNOSIS — I4819 Other persistent atrial fibrillation: Secondary | ICD-10-CM

## 2015-07-13 NOTE — Progress Notes (Signed)
Cardiology Office Note   Date:  07/13/2015   ID:  Jeremy Rush, DOB 06/13/1951, MRN [id number]  PCP:  No PCP Per Patient  Cardiologist:  Inis Sizer, NP   Chief Complaint  Patient presents with  . Atrial Fibrillation  . Hypertension      History of Present Illness: Jeremy Rush is a 64 y.o. male who presents for ongoing assessment and management of atrial fibrillation, history of aortic root enlargement, hypertension, with reduced RV systolic fx AAA per CT. Jeremy KitchenHe had CT of the Chest to evaluate known AAA. 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 He has failed two previous attempts at DCCV. CHADS VASC Score 2.      The patient was last seen in the office in March of 2016 at that time, was hemodynamically stable without any cardiac complaints. I  spoke at length with the patient using illustrations and heart model to explain AAA and need to see CVTS for evaluation for repair. He does not wish to go to CVTS at this time. He wants to wait until he sees [place] again to make a decision. I have explained to him the risks to include sudden death, and need for close follow up.    He was recently seen in the ER for abnormal labs demonstrating E-Coli which was believed to be contaminate. He had a chronic non-healing abdominal wound that did not appear infected. He was hospitalized on 07/06/2015 due to afib RVR and infected wound. Blood cultures are negative on follow up.     He was continued on Xarelto, ditlaizem was increased to 300 mg daily. It was found that CHF likely induced by rapid Afib,  He comes today without complaints of chest pain or DOE. He has gained some weight since discharge, but has been essentially inactive. Stating he is too tired.   Past Medical History  Diagnosis Date  . Morbid obesity   . Hypertension   . DJD (degenerative joint disease)     Knees  . Atrial fibrillation 2014    Initial  diagnosis in 12/2012  . Hyperlipidemia     Past Surgical History  Procedure Laterality Date  . Ventral hernia repair  1990s-2000    X4  . Abdominal debridement  2009    Dr. Ovidio Kin, infected mesh/abscess  . Wrist surgery      Right     Current Outpatient Prescriptions  Medication Sig Dispense Refill  . acetaminophen (TYLENOL) 500 MG tablet Take 500 mg by mouth every 6 (six) hours as needed for pain.    Jeremy Rush diltiazem (CARDIZEM LA) 300 MG 24 hr tablet Take 1 tablet (300 mg total) by mouth daily. 30 tablet 0  . furosemide (LASIX) 20 MG tablet Take 1 tablet daily (Patient taking differently: Take 20 mg by mouth daily. Take 1 tablet daily) 30 tablet 6  . lisinopril (PRINIVIL,ZESTRIL) 10 MG tablet TAKE 1 TABLET (10 MG TOTAL) BY MOUTH DAILY. 90 tablet 2  . loratadine (CLARITIN) 10 MG tablet TAKE 1 TABLET BY MOUTH DAILY 30 tablet 3  . rivaroxaban (XARELTO) 20 MG TABS tablet TAKE 1 TABLET (20 MG TOTAL) BY MOUTH DAILY. 30 tablet 6   No current facility-administered medications for this visit.    Allergies:   Codeine and Morphine and related    Social History:  The patient  reports that he has never smoked. He has never used smokeless tobacco. He reports that he  does not drink alcohol or use illicit drugs.   Family History:  The patient's Family history is unknown by patient.    ROS: .   All other systems are reviewed and negative.Unless otherwise mentioned in  H&P above.   PHYSICAL EXAM: VS:  BP 140/80 mmHg  Pulse 62  Ht 5\' 8"  (1.727 m)  Wt 401 lb 6.4 oz (182.074 kg)  BMI 61.05 kg/m2  SpO2 96% , BMI Body mass index is 61.05 kg/(m^2). GEN: Well nourished, well developed, in no acute distress HEENT: normal Neck: no JVD, carotid bruits, or masses Cardiac: IRRR; no murmurs, rubs, or gallops,no edema  Respiratory:  clear to auscultation bilaterally, normal work of breathing GI: soft, nontender, nondistended, + BS. Morbidly obese MS: no deformity or atrophy Some LE edema  bilaterally in the dependent position.  Skin: warm and dry, no rash Neuro:  Strength and sensation are intact Psych: euthymic mood, full affect   EKG:  The ekg ordered today demonstrates atrial fib with ICRBBB. Rate 85 bpm.    Recent Labs: 07/04/2015: ALT 12*; B Natriuretic Peptide 123.0* 07/05/2015: BUN 16; Creatinine, Ser 1.20; Potassium 4.0; Sodium 137 07/06/2015: Hemoglobin 13.5; Platelets 113*    Lipid Panel    Component Value Date/Time   CHOL  01/14/2008 0515    115        ATP III CLASSIFICATION:  <200     mg/dL   Desirable  161-096  mg/dL   Borderline High  >=045    mg/dL   High   TRIG 42 40/98/1191 0515   HDL 21* 01/14/2008 0515   CHOLHDL 5.5 01/14/2008 0515   VLDL 8 01/14/2008 0515   LDLCALC  01/14/2008 0515    86        Total Cholesterol/HDL:CHD Risk Coronary Heart Disease Risk Table                     Men   Women  1/2 Average Risk   3.4   3.3      Wt Readings from Last 3 Encounters:  07/13/15 401 lb 6.4 oz (182.074 kg)  07/09/15 408 lb (185.068 kg)  07/06/15 374 lb 4.8 oz (169.781 kg)      ASSESSMENT AND PLAN:  1. Atrial fibrillation: He has failed two attempts at DCCV. He continues on diltiazem and Xarelto. He complains of fatigue. He has been inactive due to fear of "doing something wrong."  I have encouraged him to walk daily as this will help him with his energy, wt loss and stamina. He is to walk 15 minutes each day and slowly work his way up to 30 minutes. He verbalizes understanding. I will not make any changes on his medications at this time  2. AAA: He refuses CVTS appt or intervention at this time. CVTS did call him to make appt, but he continues to want to wait to see them. He denies symptoms. I have encouraged him to reconsider.   3. Diastolic CHF. He has gained some weight but there is not overt evidence of decompensation. He will continue his current diuretics and adhere to low sodium diet.    Current medicines are reviewed at length with  the patient today.    Labs/ tests ordered today include: None No orders of the defined types were placed in this encounter.     Disposition:   FU with previously scheduled appt in Sept.   Signed, Joni Reining, NP  07/13/2015 1:00 PM    [facility] [address], [place], [place] [address] Phone: [phone]; Fax: [phone]

## 2015-07-13 NOTE — Patient Instructions (Signed)
Your physician recommends that you schedule a follow-up appointment in: in September as planned    Your physician recommends that you continue on your current medications as directed. Please refer to the Current Medication list given to you today.     Thank you for choosing [facility] !

## 2015-07-14 LAB — CULTURE, BLOOD (ROUTINE X 2)
CULTURE: NO GROWTH
Culture: NO GROWTH

## 2015-08-03 ENCOUNTER — Other Ambulatory Visit: Payer: Self-pay | Admitting: Internal Medicine

## 2015-08-04 ENCOUNTER — Telehealth: Payer: Self-pay | Admitting: Adult Health

## 2015-08-04 MED ORDER — DILTIAZEM HCL ER COATED BEADS 300 MG PO TB24: 300.0000 mg | ORAL_TABLET | Freq: Every day | ORAL | Status: DC

## 2015-08-04 NOTE — Telephone Encounter (Signed)
Pt states that his pharmacy has not received his Rx for his Cardizem yet

## 2015-08-18 ENCOUNTER — Ambulatory Visit: Payer: Medicaid Other | Admitting: Adult Health

## 2015-08-19 ENCOUNTER — Ambulatory Visit (INDEPENDENT_AMBULATORY_CARE_PROVIDER_SITE_OTHER): Payer: Medicaid Other | Admitting: Physician Assistant

## 2015-08-19 ENCOUNTER — Encounter: Payer: Self-pay | Admitting: Physician Assistant

## 2015-08-19 VITALS — BP 140/90 | HR 96 | Ht 68.0 in | Wt 394.8 lb

## 2015-08-19 DIAGNOSIS — I5032 Chronic diastolic (congestive) heart failure: Secondary | ICD-10-CM

## 2015-08-19 DIAGNOSIS — I481 Persistent atrial fibrillation: Secondary | ICD-10-CM | POA: Diagnosis not present

## 2015-08-19 DIAGNOSIS — I712 Thoracic aortic aneurysm, without rupture, unspecified: Secondary | ICD-10-CM

## 2015-08-19 DIAGNOSIS — I10 Essential (primary) hypertension: Secondary | ICD-10-CM | POA: Diagnosis not present

## 2015-08-19 DIAGNOSIS — I4819 Other persistent atrial fibrillation: Secondary | ICD-10-CM

## 2015-08-19 MED ORDER — LISINOPRIL 20 MG PO TABS
20.0000 mg | ORAL_TABLET | Freq: Every day | ORAL | Status: DC
Start: 2015-08-19 — End: 2015-09-22

## 2015-08-19 NOTE — Patient Instructions (Signed)
Your physician recommends that you schedule a follow-up appointment in:  1 Month with Dr. Purvis Sheffield.  Your physician has recommended you make the following change in your medication:   Increase Lisinopril to 20 mg Daily.  You have been referred to CVTS  Thank you for choosing [facility]!

## 2015-08-19 NOTE — Assessment & Plan Note (Signed)
Rate is controlled on increased diltiazem. Continue Xarelto.

## 2015-08-19 NOTE — Assessment & Plan Note (Signed)
Need tighter blood pressure control especially with his aneurysm. Increase lisinopril to 20 mg daily.

## 2015-08-19 NOTE — Progress Notes (Signed)
Cardiology Office Note   Date:  08/19/2015   ID:  Jeremy Rush, DOB Feb 08, 1951, MRN [id number]  PCP:  No primary care provider on file.  Cardiologist:  Dr. Purvis Sheffield  Chief Complaint: edema    History of Present Illness: Jeremy Rush is a 64 y.o. male who presents for follow up. He has history of chronic atrial fibrillation after 2 failed attempts at DCCV. He is maintained on diltiazem and Xarelto. He also has an ascending thoracic aortic aneurysm measuring up 5.0 cm in 01/2017. Was recommended he see CT surgery but he has refused. He also has hypertension and diastolic heart failure. He was hospitalized 07/06/15 with atrial fib RVR and infected wound. He also developed heart failure felt secondary to rapid atrial fibrillation. His diltiazem was increased to 300 mg daily.   Patient comes in today without cardiac complaints. He says his heart has not been racing. He has chronic edema that he thinks has gotten a little worse on the increased diltiazem. He still is refusing to go to CVT S to be evaluated for his aneurysm. His blood pressure is elevated today. He says 20 mg of Lasix causes him to urinate a lot.   Past Medical History  Diagnosis Date  . Morbid obesity   . Hypertension   . DJD (degenerative joint disease)     Knees  . Atrial fibrillation 2014    Initial diagnosis in 12/2012  . Hyperlipidemia     Past Surgical History  Procedure Laterality Date  . Ventral hernia repair  1990s-2000    X4  . Abdominal debridement  2009    Dr. Ovidio Kin, infected mesh/abscess  . Wrist surgery      Right     Current Outpatient Prescriptions  Medication Sig Dispense Refill  . acetaminophen (TYLENOL) 500 MG tablet Take 500 mg by mouth every 6 (six) hours as needed for pain.    Marland Kitchen diltiazem (CARDIZEM LA) 300 MG 24 hr tablet Take 1 tablet (300 mg total) by mouth daily. 30 tablet 3  . furosemide (LASIX) 20 MG tablet Take 1 tablet daily (Patient taking differently: Take 20 mg by mouth  daily. Take 1 tablet daily) 30 tablet 6  . lisinopril (PRINIVIL,ZESTRIL) 10 MG tablet TAKE 1 TABLET (10 MG TOTAL) BY MOUTH DAILY. 90 tablet 2  . loratadine (CLARITIN) 10 MG tablet TAKE 1 TABLET BY MOUTH DAILY 30 tablet 3  . rivaroxaban (XARELTO) 20 MG TABS tablet TAKE 1 TABLET (20 MG TOTAL) BY MOUTH DAILY. 30 tablet 6   No current facility-administered medications for this visit.    Allergies:   Codeine and Morphine and related    Social History:  The patient  reports that he has never smoked. He has never used smokeless tobacco. He reports that he does not drink alcohol or use illicit drugs.   Family History:  The patient's   Family history is unknown by patient.    ROS:  Please see the history of present illness.   Otherwise, review of systems are positive for none.   All other systems are reviewed and negative.    PHYSICAL EXAM: VS:  BP 140/90 mmHg  Pulse 96  Ht  (1.727 m)  Wt 394 lb 12.8 oz (179.08 kg)  BMI 60.04 kg/m2  SpO2 98% , BMI Body mass index is 60.04 kg/(m^2). GEN:  Obese in no acute distress Neck: no JVD, HJR, carotid bruits, or masses Cardiac:  Irregular irregular, 1/6 systolic murmur at the left sternal  border, no gallop, rubs, thrill or heave,  Respiratory:  clear to auscultation bilaterally, normal work of breathing GI: soft, nontender, nondistended, + BS MS: no deformity or atrophy Extremities:  3+ brawny edema bilaterally decreased distal pulses Skin: warm and dry, no rash Neuro:  Strength and sensation are intact    EKG:  EKG is not ordered today.    Recent Labs: 07/04/2015: ALT 12*; B Natriuretic Peptide 123.0* 07/05/2015: BUN 16; Creatinine, Ser 1.20; Potassium 4.0; Sodium 137 07/06/2015: Hemoglobin 13.5; Platelets 113*    Lipid Panel    Component Value Date/Time   CHOL  01/14/2008 0515    115        ATP III CLASSIFICATION:  <200     mg/dL   Desirable  161-096  mg/dL   Borderline High  >=045    mg/dL   High   TRIG 42 40/98/1191 0515    HDL 21* 01/14/2008 0515   CHOLHDL 5.5 01/14/2008 0515   VLDL 8 01/14/2008 0515   LDLCALC  01/14/2008 0515    86        Total Cholesterol/HDL:CHD Risk Coronary Heart Disease Risk Table                     Men   Women  1/2 Average Risk   3.4   3.3      Wt Readings from Last 3 Encounters:  08/19/15 394 lb 12.8 oz (179.08 kg)  07/13/15 401 lb 6.4 oz (182.074 kg)  07/09/15 408 lb (185.068 kg)      Other studies Reviewed: Additional studies/ records that were reviewed today include and review of the records demonstrates:  CT Chest 01/2015 IMPRESSION: Ascending thoracic aortic aneurysm. Ascending thoracic aorta measures up to 5.0 cm. Recommend semi-annual imaging followup by CTA or MRA and referral to cardiothoracic surgery if not already obtained. This recommendation follows 2010 ACCF/AHA/AATS/ACR/ASA/SCA/SCAI/SIR/STS/SVM Guidelines for the Diagnosis and Management of Patients With Thoracic Aortic Disease. Circulation. 2010; 121: Y782-N562   Coronary artery calcifications.   Indeterminate 3 mm nodule in the left upper lobe. If the patient is at high risk for bronchogenic carcinoma, follow-up chest CT at 1 year is recommended. If the patient is at low risk, no follow-up is needed. This recommendation follows the consensus statement: Guidelines for Management of Small Pulmonary Nodules Detected on CT Scans: A Statement from the Fleischner Society as published in Radiology 2005; 237:395-400.   Collateral venous structures in the right upper chest. Cannot exclude a stenosis involving the right upper extremity veins.     Electronically Signed   By: Richarda Overlie M.D.   On: 02/06/2015 12:02     ASSESSMENT AND PLAN:  Atrial fibrillation  Rate is controlled on increased diltiazem. Continue Xarelto.  Thoracic aortic aneurysm  Had a long discussion with the patient concerning his risk of sudden  Rupture and death with 5.0 cm aneurysm. Patient said he will think about going to the  CVT S. Have him see Dr.Koneswaran to discuss further.  Hypertension  Need tighter blood pressure control especially with his aneurysm. Increase lisinopril to 20 mg daily.  CHF (congestive heart failure)  Patient has chronic edema. His weight is actually down 10 pounds from his last office visit. He has been down to 388. Today's 394. 2 g sodium diet.    Elson Clan, PA-C  08/19/2015 11:37 AM    [facility] [address] [place], [place], [place]  [address] Phone: [phone]; Fax: [phone]

## 2015-08-19 NOTE — Addendum Note (Signed)
Addended by: Kerney Elbe on: 08/19/2015 11:51 AM   Modules accepted: Orders

## 2015-08-19 NOTE — Assessment & Plan Note (Signed)
Patient has chronic edema. His weight is actually down 10 pounds from his last office visit. He has been down to 388. Today's 394. 2 g sodium diet.

## 2015-08-19 NOTE — Assessment & Plan Note (Signed)
Had a long discussion with the patient concerning his risk of sudden  Rupture and death with 5.0 cm aneurysm. Patient said he will think about going to the CVT S. Have him see Dr.Koneswaran to discuss further.

## 2015-08-20 ENCOUNTER — Telehealth: Payer: Self-pay | Admitting: Adult Health

## 2015-08-20 NOTE — Telephone Encounter (Signed)
Patient has questions regarding medication changes from OV yesterday. / tg

## 2015-08-20 NOTE — Telephone Encounter (Signed)
Pt noted increased urine today and wondered if increased lisinopril could be the cause. It can sometimes cause this according to [employer] and [employer]

## 2015-08-25 ENCOUNTER — Other Ambulatory Visit: Payer: Self-pay | Admitting: Cardiovascular Disease

## 2015-08-27 ENCOUNTER — Encounter: Payer: Medicaid Other | Admitting: Cardiothoracic Surgery

## 2015-09-03 ENCOUNTER — Encounter: Payer: Medicaid Other | Admitting: Cardiothoracic Surgery

## 2015-09-08 ENCOUNTER — Encounter: Payer: Self-pay | Admitting: Cardiothoracic Surgery

## 2015-09-08 ENCOUNTER — Institutional Professional Consult (permissible substitution) (INDEPENDENT_AMBULATORY_CARE_PROVIDER_SITE_OTHER): Payer: Medicaid Other | Admitting: Cardiothoracic Surgery

## 2015-09-08 VITALS — BP 143/94 | HR 98 | Resp 20 | Ht 68.0 in | Wt 395.0 lb

## 2015-09-08 DIAGNOSIS — I712 Thoracic aortic aneurysm, without rupture, unspecified: Secondary | ICD-10-CM

## 2015-09-08 DIAGNOSIS — Z6841 Body Mass Index (BMI) 40.0 and over, adult: Secondary | ICD-10-CM | POA: Diagnosis not present

## 2015-09-08 DIAGNOSIS — I482 Chronic atrial fibrillation, unspecified: Secondary | ICD-10-CM

## 2015-09-08 NOTE — Progress Notes (Signed)
PCP is No primary care provider on file. Referring Provider is Dyann Kief, PA-C  Chief Complaint  Patient presents with  . 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 This has shown no change over 6 months since his prior scan this year. It is asymptomatic. There is no evidence of ulceration or intramural hematoma. There is no family history of thoracic or abdominal aortic aneurysm disease. The patient's last echocardiogram was in 2014 demonstrating ejection fraction of 50% without aortic valve pathology, mild-moderate dilatation of the ascending aorta noted.  Patient states he is compliant with his antihypertensive medications. The patient states he was recommended to walk 20 minutes daily by the cardiology office in [place]. The patient is a nonsmoker.  Past Medical History  Diagnosis Date  . Morbid obesity (HCC)   . Hypertension   . DJD (degenerative joint disease)     Knees  . Atrial fibrillation (HCC) 2014    Initial diagnosis in 12/2012  . Hyperlipidemia     Past Surgical History  Procedure Laterality Date  . Ventral hernia repair  1990s-2000    X4  . Abdominal debridement  2009    Dr. Ovidio Kin, infected mesh/abscess  . Wrist surgery      Right    Family History  Problem Relation Age of Onset  . Family history unknown: Yes    Social History Social History  Substance Use Topics  . Smoking status: Never Smoker   . Smokeless tobacco: Never Used  . Alcohol Use: No    Current Outpatient Prescriptions  Medication Sig Dispense Refill  . acetaminophen (TYLENOL) 500 MG tablet Take 500 mg by mouth every 6 (six) hours as needed for pain.    Marland Kitchen diltiazem (CARDIZEM LA) 300 MG 24  hr tablet Take 1 tablet (300 mg total) by mouth daily. 30 tablet 3  . furosemide (LASIX) 20 MG tablet Take 1 tablet daily (Patient taking differently: Take 20 mg by mouth daily. Take 1 tablet daily) 30 tablet 6  . lisinopril (PRINIVIL,ZESTRIL) 20 MG tablet Take 1 tablet (20 mg total) by mouth daily. 90 tablet 3  . loratadine (CLARITIN) 10 MG tablet TAKE 1 TABLET BY MOUTH DAILY 30 tablet 3  . XARELTO 20 MG TABS tablet TAKE 1 TABLET BY MOUTH EVERY DAY 30 tablet 6   No current facility-administered medications for this visit.    Allergies  Allergen Reactions  . Codeine Other (See Comments)    Mood swing  . Morphine And Related Other (See Comments)    Mood swing    Review of Systems         Review of Systems :  [ y ] = yes, [  ] = no        General :  Weight gain [   ]    Weight loss  [   ]  Fatigue [  ]  Fever [  ]  Chills  [  ]                                Weakness  [  ]           Cardiac :  Chest pain/ pressure [  ]  Resting SOB [  ] exertional SOB [ Yes ]                        Orthopnea [  ]  Pedal edema  [  ]  Palpitations [ yes ] Syncope/presyncope                         Paroxysmal nocturnal dyspnea [  ]        Pulmonary : cough [  ]  wheezing [  ]  Hemoptysis [  ] Sputum [  ] Snoring [  ]                              Pneumothorax [  ]  Sleep apnea Genevieve.Olden  ]       GI : Vomiting [  ]  Dysphagia [  ]  Melena  [  ]  Abdominal pain [  ] BRBPR [  ]              Heart burn [  ]  Constipation [  ] Diarrhea  [  ] Colonoscopy [  ]       GU : Hematuria [  ]  Dysuria [  ]  Nocturia [  ] UTI's [  ]       Vascular : Claudication [  ]  Rest pain [  ]  DVT [  ] Vein stripping [  ] leg ulcers [  ]                          TIA [  ] Stroke [  ]  Varicose veins [ venous insufficiency of both lower remedies ]       NEURO :  Headaches  [  ] Seizures [  ] Vision changes [  ] Paresthesias [  ]       Musculoskeletal :  Arthritis Mahler.Beck  ] Gout  [  ]  Back pain [  ]  Joint pain [  ]       Skin :   Rash [  ]  Melanoma [  ]        Heme : Bleeding problems [  ]Clotting Disorders [  ] Anemia [  ]Blood Transfusion        Endocrine : Diabetes [ no ] Thyroid Disorder  [  ]       Psych : Depression [  ]  Anxiety [  ]  Psych hospitalizations [yes-patient hospitalized in August 2016 for cellulitis and soft tissue abscess the lower abdomen-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  ]                                               BP 143/94 mmHg  Pulse 98  Resp 20  Ht  (1.727 m)  Wt 395 lb (179.171 kg)  BMI 60.07 kg/m2  SpO2 97% Physical Exam       Physical Exam  General: Alert morbidly obese middle-aged Caucasian male in no acute distress, anxious HEENT: Normocephalic pupils equal , dentition poor with necrotic teeth Neck:  Supple without JVD, adenopathy, or bruit Chest: Clear to auscultation, symmetrical breath sounds, no rhonchi, no tenderness             or deformity Cardiovascular:irregular rate and rhythm, no murmur, no gallop, peripheral pulses not palpable          Abdomen:  Soft,obese, nontender, no palpable mass or organomegaly. Surgical dressing over lower abdomen sinus tract Extremities: Warm, well-perfused, no clubbing cyanosis edema or tenderness,              no venous stasis changes of the legs Rectal/GU: Deferred Neuro: Grossly non--focal and symmetrical throughout Skin: Clean and dry without rash or ulceration    Diagnostic Tests: CT scan without contrast shows a fusiform dilatation of the ascending aorta measuring 5.0 cm in diameter. The patient's body size this is not a significant aneurysm and the risk of tear or dissection is less than 3%.  The patient is a poor candidate for surgery due to his morbid obesity--he would probably be ventilator dependent after sternotomy.  His best treatment is blood pressure control, weight control, and serial surveillance scans.  Impression: Moderate ascending aortic fusiform  aneurysm 5.0 cm, stable and asymptomatic. Risk of dissection was less than 3%.  Plan:return for repeat CT scan of chest without contrast in one year.   Mikey BussingPeter Van Trigt III, MD [facility] [phone]

## 2015-09-22 ENCOUNTER — Encounter: Payer: Self-pay | Admitting: Cardiovascular Disease

## 2015-09-22 ENCOUNTER — Ambulatory Visit (INDEPENDENT_AMBULATORY_CARE_PROVIDER_SITE_OTHER): Payer: Medicaid Other | Admitting: Cardiovascular Disease

## 2015-09-22 VITALS — BP 154/88 | HR 69 | Wt 392.0 lb

## 2015-09-22 DIAGNOSIS — R600 Localized edema: Secondary | ICD-10-CM | POA: Diagnosis not present

## 2015-09-22 DIAGNOSIS — I482 Chronic atrial fibrillation, unspecified: Secondary | ICD-10-CM

## 2015-09-22 DIAGNOSIS — I712 Thoracic aortic aneurysm, without rupture, unspecified: Secondary | ICD-10-CM

## 2015-09-22 DIAGNOSIS — I10 Essential (primary) hypertension: Secondary | ICD-10-CM | POA: Diagnosis not present

## 2015-09-22 MED ORDER — LISINOPRIL 40 MG PO TABS: 40.0000 mg | ORAL_TABLET | Freq: Every day | ORAL | Status: DC

## 2015-09-22 NOTE — Patient Instructions (Signed)
Your physician wants you to follow-up in: 1 year with Dr Reggy EyeKoneswaran You will receive a reminder letter in the mail two months in advance. If you don't receive a letter, please call our office to schedule the follow-up appointment.     INCREASE Lisinopril to 40 mg daily    If you need a refill on your cardiac medications before your next appointment, please call your pharmacy.      Thank you for choosing [facility] !

## 2015-09-22 NOTE — Progress Notes (Signed)
Patient ID: Jeremy Rush, male   DOB: 1951-01-01, 64 y.o.   MRN: [id number]      SUBJECTIVE: The patient presents for follow-up for chronic atrial fibrillation, essential hypertension, and an ascending aortic aneurysm.  He met with CT surgery last month and Dr. Prescott Gum commented that the patient is a poor candidate for surgery due to morbid obesity, stating he would probably be ventilator dependent after sternotomy. He also commented that the patient's moderate ascending aortic fusiform aneurysm of 5 cm was stable and asymptomatic, and the risk of dissection was less than 3%.   He recommended good blood pressure and weight control with serial surveillance scans.  Denies chest pain and palpitations.   Review of Systems: As per "subjective", otherwise negative.  Allergies  Allergen Reactions  . Codeine Other (See Comments)    Mood swing  . Morphine And Related Other (See Comments)    Mood swing    Current Outpatient Prescriptions  Medication Sig Dispense Refill  . acetaminophen (TYLENOL) 500 MG tablet Take 500 mg by mouth every 6 (six) hours as needed for pain.    Marland Kitchen diltiazem (CARDIZEM LA) 300 MG 24 hr tablet Take 1 tablet (300 mg total) by mouth daily. 30 tablet 3  . furosemide (LASIX) 20 MG tablet Take 1 tablet daily (Patient taking differently: Take 20 mg by mouth daily. Take 1 tablet daily) 30 tablet 6  . lisinopril (PRINIVIL,ZESTRIL) 20 MG tablet Take 1 tablet (20 mg total) by mouth daily. 90 tablet 3  . loratadine (CLARITIN) 10 MG tablet TAKE 1 TABLET BY MOUTH DAILY 30 tablet 3  . XARELTO 20 MG TABS tablet TAKE 1 TABLET BY MOUTH EVERY DAY 30 tablet 6   No current facility-administered medications for this visit.    Past Medical History  Diagnosis Date  . Morbid obesity ([place])   . Hypertension   . DJD (degenerative joint disease)     Knees  . Atrial fibrillation ([place]) 2014    Initial diagnosis in 12/2012  . Hyperlipidemia     Past Surgical History  Procedure  Laterality Date  . Ventral hernia repair  1990s-2000    X4  . Abdominal debridement  2009    Dr. Alphonsa Overall, infected mesh/abscess  . Wrist surgery      Right    Social History   Social History  . Marital Status: Single    Spouse Name: N/A  . Number of Children: N/A  . Years of Education: N/A   Occupational History  . Disabled    Social History Main Topics  . Smoking status: Never Smoker   . Smokeless tobacco: Never Used  . Alcohol Use: No  . Drug Use: No  . Sexual Activity: Not on file   Other Topics Concern  . Not on file   Social History Narrative   Lives alone with family nearby     [place] Vitals:   09/22/15 1141  BP: 154/88  Pulse: 69  Weight: 392 lb (177.81 kg)  SpO2: 97%    PHYSICAL EXAM General: NAD HEENT: Normal. Neck: JVP difficult to assess. Lungs: Clear to auscultation bilaterally with normal respiratory effort. CV: Regular rate and irregular rhythm, normal S1/S2, no S3, 1/6 systolic murmur along left sternal border. No carotid bruits. Brawny pedal edema with b/l stasis dermatitis..   Abdomen: Obese. Neurologic: Alert and oriented x 3.  Psych: Normal affect. Skin: Normal. Musculoskeletal: Normal range of motion. Extremities: No clubbing or cyanosis.   ECG: Most recent ECG  reviewed.      ASSESSMENT AND PLAN: 1. Chronic atrial fibrillation: Rate controlled on diltiazem, and on Xarelto for anticoagulation. No changes.  2. Essential HTN: Elevated on lisinopril 20 mg. Will increase to 40 mg, but suspect he will need an additional antihypertensive agent.  3. Ascending aortic aneurysm: As noted above, aim for BP control. Increase lisinopril as noted above. Will f/u with CT surgery for surveillance scans. Suboptimal surgical candidate due to morbid obesity.  4. Chronic leg swelling: Controlled on Lasix 20 mg daily.  Dispo: f/u 1 year.   Kate Sable, M.D., F.A.C.C.

## 2015-09-24 ENCOUNTER — Other Ambulatory Visit: Payer: Self-pay | Admitting: Adult Health

## 2015-09-24 ENCOUNTER — Ambulatory Visit: Payer: Medicaid Other | Admitting: Cardiovascular Disease

## 2015-09-29 ENCOUNTER — Telehealth: Payer: Self-pay | Admitting: Adult Health

## 2015-09-29 NOTE — Telephone Encounter (Signed)
Patient would like to speak with nurse regarding medication change. / tg  °

## 2015-12-02 ENCOUNTER — Other Ambulatory Visit: Payer: Self-pay | Admitting: Adult Health

## 2016-01-03 ENCOUNTER — Inpatient Hospital Stay (HOSPITAL_COMMUNITY): Payer: Medicaid Other

## 2016-01-03 ENCOUNTER — Emergency Department (HOSPITAL_COMMUNITY): Payer: Medicaid Other

## 2016-01-03 ENCOUNTER — Encounter (HOSPITAL_COMMUNITY): Payer: Self-pay | Admitting: Emergency Medicine

## 2016-01-03 ENCOUNTER — Inpatient Hospital Stay (HOSPITAL_COMMUNITY)
Admission: EM | Admit: 2016-01-03 | Discharge: 2016-01-06 | DRG: 871 | Disposition: A | Discharge status: Home / Self Care | Payer: Medicaid Other | Attending: Internal Medicine | Admitting: Internal Medicine

## 2016-01-03 DIAGNOSIS — I482 Chronic atrial fibrillation: Secondary | ICD-10-CM | POA: Diagnosis not present

## 2016-01-03 DIAGNOSIS — Z7901 Long term (current) use of anticoagulants: Secondary | ICD-10-CM | POA: Diagnosis not present

## 2016-01-03 DIAGNOSIS — R0603 Acute respiratory distress: Secondary | ICD-10-CM

## 2016-01-03 DIAGNOSIS — R6521 Severe sepsis with septic shock: Secondary | ICD-10-CM | POA: Diagnosis present

## 2016-01-03 DIAGNOSIS — Z6841 Body Mass Index (BMI) 40.0 and over, adult: Secondary | ICD-10-CM | POA: Diagnosis not present

## 2016-01-03 DIAGNOSIS — M170 Bilateral primary osteoarthritis of knee: Secondary | ICD-10-CM | POA: Diagnosis not present

## 2016-01-03 DIAGNOSIS — I10 Essential (primary) hypertension: Secondary | ICD-10-CM | POA: Diagnosis present

## 2016-01-03 DIAGNOSIS — K029 Dental caries, unspecified: Secondary | ICD-10-CM | POA: Diagnosis present

## 2016-01-03 DIAGNOSIS — Z885 Allergy status to narcotic agent status: Secondary | ICD-10-CM | POA: Diagnosis not present

## 2016-01-03 DIAGNOSIS — R0602 Shortness of breath: Secondary | ICD-10-CM | POA: Diagnosis not present

## 2016-01-03 DIAGNOSIS — R0600 Dyspnea, unspecified: Secondary | ICD-10-CM | POA: Diagnosis present

## 2016-01-03 DIAGNOSIS — I4891 Unspecified atrial fibrillation: Secondary | ICD-10-CM | POA: Diagnosis not present

## 2016-01-03 DIAGNOSIS — E785 Hyperlipidemia, unspecified: Secondary | ICD-10-CM | POA: Diagnosis not present

## 2016-01-03 DIAGNOSIS — S31109D Unspecified open wound of abdominal wall, unspecified quadrant without penetration into peritoneal cavity, subsequent encounter: Secondary | ICD-10-CM | POA: Diagnosis not present

## 2016-01-03 DIAGNOSIS — K0889 Other specified disorders of teeth and supporting structures: Secondary | ICD-10-CM | POA: Diagnosis present

## 2016-01-03 DIAGNOSIS — A419 Sepsis, unspecified organism: Principal | ICD-10-CM

## 2016-01-03 HISTORY — DX: Unspecified open wound of abdominal wall, unspecified quadrant without penetration into peritoneal cavity, initial encounter: S31.109A

## 2016-01-03 HISTORY — DX: Local infection of the skin and subcutaneous tissue, unspecified: L08.9

## 2016-01-03 LAB — PROCALCITONIN: Procalcitonin: 30.83 ng/mL

## 2016-01-03 LAB — COMPREHENSIVE METABOLIC PANEL
ALT: 12 U/L — ABNORMAL LOW (ref 17–63)
AST: 22 U/L (ref 15–41)
Albumin: 3.6 g/dL (ref 3.5–5.0)
Alkaline Phosphatase: 59 U/L (ref 38–126)
Anion gap: 10 (ref 5–15)
BUN: 18 mg/dL (ref 6–20)
CHLORIDE: 106 mmol/L (ref 101–111)
CO2: 25 mmol/L (ref 22–32)
Calcium: 8.4 mg/dL — ABNORMAL LOW (ref 8.9–10.3)
Creatinine, Ser: 1.19 mg/dL (ref 0.61–1.24)
GFR calc Af Amer: >60 mL/min (ref >60)
Glucose, Bld: 106 mg/dL — ABNORMAL HIGH (ref 65–99)
POTASSIUM: 3.8 mmol/L (ref 3.5–5.1)
SODIUM: 141 mmol/L (ref 135–145)
Total Bilirubin: 1.2 mg/dL (ref 0.3–1.2)
Total Protein: 7.3 g/dL (ref 6.5–8.1)

## 2016-01-03 LAB — CBC WITH DIFFERENTIAL/PLATELET
BASOS ABS: 0 10*3/uL (ref 0.0–0.1)
BASOS PCT: 0 %
EOS ABS: 0.1 10*3/uL (ref 0.0–0.7)
EOS PCT: 1 %
HCT: 44.8 % (ref 39.0–52.0)
Hemoglobin: 14.1 g/dL (ref 13.0–17.0)
LYMPHS PCT: 5 %
Lymphs Abs: 0.3 10*3/uL — ABNORMAL LOW (ref 0.7–4.0)
MCH: 26.5 pg (ref 26.0–34.0)
MCHC: 31.5 g/dL (ref 30.0–36.0)
MCV: 84.2 fL (ref 78.0–100.0)
MONO ABS: 0.1 10*3/uL (ref 0.1–1.0)
Monocytes Relative: 2 %
Neutro Abs: 5.3 10*3/uL (ref 1.7–7.7)
Neutrophils Relative %: 92 %
PLATELETS: 148 10*3/uL — AB (ref 150–400)
RBC: 5.32 MIL/uL (ref 4.22–5.81)
RDW: 14.8 % (ref 11.5–15.5)
WBC: 5.7 10*3/uL (ref 4.0–10.5)

## 2016-01-03 LAB — BLOOD GAS, ARTERIAL
Acid-Base Excess: 0.7 mmol/L (ref 0.0–2.0)
Bicarbonate: 22.7 mEq/L (ref 20.0–24.0)
Drawn by: 25788
O2 CONTENT: 6 L/min
O2 SAT: 44.4 %
pCO2 arterial: 54 mmHg — ABNORMAL HIGH (ref 35.0–45.0)
pH, Arterial: 7.309 — ABNORMAL LOW (ref 7.350–7.450)

## 2016-01-03 LAB — URINALYSIS, ROUTINE W REFLEX MICROSCOPIC
BILIRUBIN URINE: NEGATIVE
Glucose, UA: NEGATIVE mg/dL
KETONES UR: NEGATIVE mg/dL
Leukocytes, UA: NEGATIVE
NITRITE: NEGATIVE
Protein, ur: NEGATIVE mg/dL
SPECIFIC GRAVITY, URINE: 1.01 (ref 1.005–1.030)
pH: 5 (ref 5.0–8.0)

## 2016-01-03 LAB — LACTIC ACID, PLASMA
LACTIC ACID, VENOUS: 1.3 mmol/L (ref 0.5–2.0)
LACTIC ACID, VENOUS: 1.5 mmol/L (ref 0.5–2.0)

## 2016-01-03 LAB — PROTIME-INR
INR: 2.77 — ABNORMAL HIGH (ref 0.00–1.49)
Prothrombin Time: 28.8 seconds — ABNORMAL HIGH (ref 11.6–15.2)

## 2016-01-03 LAB — URINE MICROSCOPIC-ADD ON

## 2016-01-03 LAB — MAGNESIUM: Magnesium: 1.5 mg/dL — ABNORMAL LOW (ref 1.7–2.4)

## 2016-01-03 LAB — APTT: APTT: 35 s (ref 24–37)

## 2016-01-03 LAB — MRSA PCR SCREENING: MRSA by PCR: NEGATIVE

## 2016-01-03 LAB — CBC
HEMATOCRIT: 40.7 % (ref 39.0–52.0)
HEMOGLOBIN: 12.8 g/dL — AB (ref 13.0–17.0)
MCH: 26.2 pg (ref 26.0–34.0)
MCHC: 31.4 g/dL (ref 30.0–36.0)
MCV: 83.4 fL (ref 78.0–100.0)
Platelets: 144 10*3/uL — ABNORMAL LOW (ref 150–400)
RBC: 4.88 MIL/uL (ref 4.22–5.81)
RDW: 14.8 % (ref 11.5–15.5)
WBC: 15.6 10*3/uL — ABNORMAL HIGH (ref 4.0–10.5)

## 2016-01-03 LAB — I-STAT TROPONIN, ED: TROPONIN I, POC: 0 ng/mL (ref 0.00–0.08)

## 2016-01-03 LAB — ABO/RH: ABO/RH(D): O POS

## 2016-01-03 LAB — BRAIN NATRIURETIC PEPTIDE: B NATRIURETIC PEPTIDE 5: 162 pg/mL — AB (ref 0.0–100.0)

## 2016-01-03 LAB — GLUCOSE, CAPILLARY
GLUCOSE-CAPILLARY: 89 mg/dL (ref 65–99)
GLUCOSE-CAPILLARY: 113 mg/dL — AB (ref 65–99)

## 2016-01-03 LAB — STREP PNEUMONIAE URINARY ANTIGEN: STREP PNEUMO URINARY ANTIGEN: NEGATIVE

## 2016-01-03 LAB — TROPONIN I: Troponin I: <0.03 ng/mL (ref <0.031)

## 2016-01-03 LAB — PHOSPHORUS: Phosphorus: 1.9 mg/dL — ABNORMAL LOW (ref 2.5–4.6)

## 2016-01-03 LAB — I-STAT CG4 LACTIC ACID, ED: Lactic Acid, Venous: 3.7 mmol/L (ref 0.5–2.0)

## 2016-01-03 LAB — TYPE AND SCREEN
ABO/RH(D): O POS
ANTIBODY SCREEN: NEGATIVE

## 2016-01-03 LAB — D-DIMER, QUANTITATIVE: D-Dimer, Quant: 0.53 ug/mL-FEU — ABNORMAL HIGH (ref 0.00–0.50)

## 2016-01-03 LAB — CORTISOL: Cortisol, Plasma: 18 ug/dL

## 2016-01-03 MED ORDER — DEXTROSE 5 % IV SOLN
500.0000 mg | INTRAVENOUS | Status: DC
  Filled 2016-01-03: qty 500

## 2016-01-03 MED ORDER — ETOMIDATE 2 MG/ML IV SOLN
INTRAVENOUS | Status: AC
  Filled 2016-01-03: qty 10

## 2016-01-03 MED ORDER — PANTOPRAZOLE SODIUM 40 MG IV SOLR: 40.0000 mg | Freq: Every day | INTRAVENOUS | Status: DC

## 2016-01-03 MED ORDER — PANTOPRAZOLE SODIUM 40 MG PO TBEC
40.0000 mg | DELAYED_RELEASE_TABLET | Freq: Every day | ORAL | Status: DC
  Administered 2016-01-04 – 2016-01-06 (×3): 40 mg via ORAL
  Filled 2016-01-03 (×3): qty 1

## 2016-01-03 MED ORDER — DEXTROSE 5 % IV SOLN
2.0000 g | INTRAVENOUS | Status: DC
  Administered 2016-01-04 – 2016-01-05 (×2): 2 g via INTRAVENOUS
  Filled 2016-01-03 (×3): qty 2

## 2016-01-03 MED ORDER — DEXTROSE 5 % IV SOLN
1.0000 g | Freq: Once | INTRAVENOUS | Status: AC
  Administered 2016-01-03: 1 g via INTRAVENOUS
  Filled 2016-01-03: qty 10

## 2016-01-03 MED ORDER — ONDANSETRON HCL 4 MG/2ML IJ SOLN
4.0000 mg | Freq: Once | INTRAMUSCULAR | Status: AC
  Administered 2016-01-03: 4 mg via INTRAMUSCULAR
  Filled 2016-01-03: qty 2

## 2016-01-03 MED ORDER — IOHEXOL 350 MG/ML SOLN
100.0000 mL | Freq: Once | INTRAVENOUS | Status: AC | PRN
  Administered 2016-01-03: 100 mL via INTRAVENOUS

## 2016-01-03 MED ORDER — ACETAMINOPHEN 500 MG PO TABS
1000.0000 mg | ORAL_TABLET | Freq: Once | ORAL | Status: AC
  Administered 2016-01-03: 1000 mg via ORAL
  Filled 2016-01-03: qty 2

## 2016-01-03 MED ORDER — IOHEXOL 300 MG/ML  SOLN
50.0000 mL | INTRAMUSCULAR | Status: AC
  Administered 2016-01-03: 25 mL via ORAL
  Administered 2016-01-03: 50 mL via ORAL

## 2016-01-03 MED ORDER — CETYLPYRIDINIUM CHLORIDE 0.05 % MT LIQD
7.0000 mL | Freq: Two times a day (BID) | OROMUCOSAL | Status: DC
  Administered 2016-01-03 – 2016-01-06 (×5): 7 mL via OROMUCOSAL

## 2016-01-03 MED ORDER — PHENYLEPHRINE HCL 10 MG/ML IJ SOLN
20.0000 ug/min | INTRAVENOUS | Status: DC
  Filled 2016-01-03: qty 1

## 2016-01-03 MED ORDER — RIVAROXABAN 20 MG PO TABS
20.0000 mg | ORAL_TABLET | Freq: Every day | ORAL | Status: DC
  Administered 2016-01-04 – 2016-01-05 (×2): 20 mg via ORAL
  Filled 2016-01-03 (×4): qty 1

## 2016-01-03 MED ORDER — DEXTROSE 5 % IV SOLN
500.0000 mg | Freq: Once | INTRAVENOUS | Status: AC
  Administered 2016-01-03: 500 mg via INTRAVENOUS
  Filled 2016-01-03: qty 500

## 2016-01-03 MED ORDER — METHYLPREDNISOLONE SODIUM SUCC 125 MG IJ SOLR
125.0000 mg | Freq: Once | INTRAMUSCULAR | Status: AC
  Administered 2016-01-03: 125 mg via INTRAVENOUS
  Filled 2016-01-03: qty 2

## 2016-01-03 MED ORDER — METRONIDAZOLE IN NACL 5-0.79 MG/ML-% IV SOLN
500.0000 mg | Freq: Four times a day (QID) | INTRAVENOUS | Status: DC
  Administered 2016-01-03 – 2016-01-04 (×3): 500 mg via INTRAVENOUS
  Filled 2016-01-03 (×5): qty 100

## 2016-01-03 MED ORDER — SODIUM CHLORIDE 0.9 % IV BOLUS (SEPSIS): 500.0000 mL | INTRAVENOUS | Status: AC

## 2016-01-03 MED ORDER — ALBUTEROL SULFATE (2.5 MG/3ML) 0.083% IN NEBU
5.0000 mg | INHALATION_SOLUTION | Freq: Once | RESPIRATORY_TRACT | Status: AC
  Administered 2016-01-03: 5 mg via RESPIRATORY_TRACT
  Filled 2016-01-03: qty 6

## 2016-01-03 MED ORDER — SODIUM CHLORIDE 0.9 % IV BOLUS (SEPSIS)
1000.0000 mL | INTRAVENOUS | Status: AC
  Administered 2016-01-03 (×4): 1000 mL via INTRAVENOUS

## 2016-01-03 NOTE — Progress Notes (Signed)
Pharmacy Antibiotic Note  Jeremy Rush is a 65 y.o. male admitted on 01/03/2016 with sepsis.  Pharmacy has been consulted for ceftriaxone and azithromycin dosing.  Plan: Initiate ceftriaxone 2 g q24 (higher dose due to obesity) x 1 week and Azithromycin 500 mg IV q24h x 5 days  Height:  (172.7 cm) Weight: (!) 378 lb (171.46 kg) IBW/kg (Calculated) : 68.4  Temp (24hrs), Avg:100.6 F (38.1 C), Min:98.2 F (36.8 C), Max:102.9 F (39.4 C)   Recent Labs Lab 01/03/16 1147  WBC 5.7  CREATININE 1.19  LATICACIDVEN 3.70*    Estimated Creatinine Clearance: 98.5 mL/min (by C-G formula based on Cr of 1.19).    Allergies  Allergen Reactions  . Codeine Other (See Comments)    Mood swing  . Morphine And Related Other (See Comments)    Mood swing    Antimicrobials this admission: Ceftriaxone 2/12>>(2/19) Azithromycin 2/12>>(2/17)  Microbiology results: 2/12 Blood x 2 2/12 Urine  Isaac Bliss, PharmD, BCPS, [facility] Clinical Pharmacist Pager [phone] 01/03/2016 3:43 PM

## 2016-01-03 NOTE — ED Notes (Signed)
EDP in to re eval 

## 2016-01-03 NOTE — ED Notes (Signed)
Per EMS pt c/o not feeling well, sob. BLE edema.

## 2016-01-03 NOTE — ED Provider Notes (Signed)
CSN: [id number]     Arrival date & time 01/03/16  1053 History  By signing my name below, I, Arianna Nassar, attest that this documentation has been prepared under the direction and in the presence of Margarita Grizzle, MD. Electronically Signed: Octavia Heir, ED Scribe. 01/03/2016. 11:06 AM.    Chief Complaint  Patient presents with  . Shortness of Breath      Patient is a 65 y.o. male presenting with shortness of breath. The history is provided by the patient. No language interpreter was used.  Shortness of Breath Severity:  Mild Onset quality:  Sudden Duration:  30 minutes Timing:  Constant Progression:  Worsening Chronicity:  New Context: not activity   Relieved by:  Lying down Worsened by:  Nothing tried Ineffective treatments:  None tried Associated symptoms: cough, fever and vomiting   Risk factors: obesity   Risk factors: no recent alcohol use    HPI Comments: GIANNI MIHALIK is a 65 y.o. male that was brought in by EMS. who has a hx of morbid obesity, HTN, DJD, a-fib, and hyperlipidemia presents to the Emergency Department complaining of constant, gradual worsening shortness of breath onset about 30 minutes PTA. He reports sitting down earlier today when he began to feel weak, short of breath, light-headed and started to cough. He had one episode of vomiting. Laying down is modifying factor to his pain. He reports walking minimally and lives with his sister. He is currently on blood thinners.  Pt denies hx of blood clots, nausea, and chest pain.   Past Medical History  Diagnosis Date  . Morbid obesity (HCC)   . Hypertension   . DJD (degenerative joint disease)     Knees  . Atrial fibrillation (HCC) 2014    Initial diagnosis in 12/2012  . Hyperlipidemia    Past Surgical History  Procedure Laterality Date  . Ventral hernia repair  1990s-2000    X4  . Abdominal debridement  2009    Dr. Ovidio Kin, infected mesh/abscess  . Wrist surgery      Right   Family History   Problem Relation Age of Onset  . Family history unknown: Yes   Social History  Substance Use Topics  . Smoking status: Never Smoker   . Smokeless tobacco: Never Used  . Alcohol Use: No    Review of Systems  Constitutional: Positive for fever.  Respiratory: Positive for cough and shortness of breath.   Gastrointestinal: Positive for vomiting. Negative for nausea.  All other systems reviewed and are negative.     Allergies  Codeine and Morphine and related  Home Medications   Prior to Admission medications   Medication Sig Start Date End Date Taking? Authorizing Provider  acetaminophen (TYLENOL) 500 MG tablet Take 500 mg by mouth every 6 (six) hours as needed for pain.    Historical Provider, MD  CARDIZEM LA 300 MG 24 hr tablet TAKE 1 TABLET (300 MG TOTAL) BY MOUTH DAILY. 12/02/15   Laqueta Linden, MD  furosemide (LASIX) 20 MG tablet Take 1 tablet daily Patient taking differently: Take 20 mg by mouth daily. Take 1 tablet daily 02/25/15   Laqueta Linden, MD  furosemide (LASIX) 20 MG tablet Take 1 tablet (20 mg total) by mouth daily. 09/25/15   Laqueta Linden, MD  lisinopril (PRINIVIL,ZESTRIL) 40 MG tablet Take 1 tablet (40 mg total) by mouth daily. 09/22/15   Laqueta Linden, MD  loratadine (CLARITIN) 10 MG tablet TAKE 1 TABLET BY MOUTH DAILY  01/12/15   Jodelle Gross, NP  XARELTO 20 MG TABS tablet TAKE 1 TABLET BY MOUTH EVERY DAY 08/25/15   Laqueta Linden, MD   Ht 5\' 8"  (1.727 m)  Wt 378 lb (171.46 kg)  BMI 57.49 kg/m2 Physical Exam  Constitutional: He is oriented to person, place, and time. He appears well-developed and well-nourished. He appears distressed.  Morbidly obese  HENT:  Head: Normocephalic and atraumatic.  Right Ear: External ear normal.  Left Ear: External ear normal.  Nose: Nose normal.  Mouth/Throat: Oropharynx is clear and moist.  Eyes: Conjunctivae and EOM are normal. Pupils are equal, round, and reactive to light.  Neck: Normal  range of motion. Neck supple.  Cardiovascular: Normal rate, regular rhythm, normal heart sounds and intact distal pulses.   Pulmonary/Chest: Effort normal and breath sounds normal. No respiratory distress. He has no wheezes. He exhibits no tenderness.  Abdominal: Soft. Bowel sounds are normal. He exhibits no distension and no mass. There is no tenderness. There is no guarding.  Musculoskeletal: Normal range of motion.  Neurological: He is alert and oriented to person, place, and time. He has normal reflexes. He exhibits normal muscle tone. Coordination normal.  Skin: Skin is warm and dry.  Psychiatric: He has a normal mood and affect. His behavior is normal. Judgment and thought content normal.  Nursing note and vitals reviewed.   ED Course  Procedures  COORDINATION OF CARE:  11:21 AM Discussed treatment plan which includes lab work and dg chest portable with pt at bedside and pt agreed to plan. 11:23 AM Febrile of 102  Labs Review Labs Reviewed  CBC WITH DIFFERENTIAL/PLATELET - Abnormal; Notable for the following:    Platelets 148 (*)    Lymphs Abs 0.3 (*)    All other components within normal limits  D-DIMER, QUANTITATIVE (NOT AT [facility]) - Abnormal; Notable for the following:    D-Dimer, Quant 0.53 (*)    All other components within normal limits  URINALYSIS, ROUTINE W REFLEX MICROSCOPIC (NOT AT [facility]) - Abnormal; Notable for the following:    Hgb urine dipstick TRACE (*)    All other components within normal limits  BLOOD GAS, ARTERIAL - Abnormal; Notable for the following:    pH, Arterial 7.309 (*)    pCO2 arterial 54.0 (*)    All other components within normal limits  URINE MICROSCOPIC-ADD ON - Abnormal; Notable for the following:    Squamous Epithelial / LPF 0-5 (*)    Bacteria, UA RARE (*)    All other components within normal limits  I-STAT CG4 LACTIC ACID, ED - Abnormal; Notable for the following:    Lactic Acid, Venous 3.70 (*)    All other components within normal  limits  CULTURE, BLOOD (ROUTINE X 2)  CULTURE, BLOOD (ROUTINE X 2)  URINE CULTURE  COMPREHENSIVE METABOLIC PANEL  BRAIN NATRIURETIC PEPTIDE  I-STAT TROPOININ, ED    Imaging Review Dg Chest Portable 1 View  01/03/2016  CLINICAL DATA:  65 year old male with shortness of breath today. EXAM: PORTABLE CHEST 1 VIEW COMPARISON:  A 13 2016 and prior exams FINDINGS: Cardiomegaly and pulmonary vascular congestion noted. Mild interstitial opacities may represent mild interstitial edema. Bibasilar atelectasis is noted. No large pleural effusions or pneumothorax noted. IMPRESSION: Cardiomegaly with pulmonary vascular congestion and possible mild interstitial edema. Electronically Signed   By: Harmon Pier M.D.   On: 01/03/2016 12:08   I have personally reviewed and evaluated these images and lab results as part of my  medical decision-making.   EKG Interpretation None      MDM   Final diagnoses:  Respiratory distress  Sepsis, due to unspecified organism [facility])   This is a 65 year old male who is morbidly obese presents today with sudden onset of chills and dyspnea. Chest x-Zaki Gertsch does not show focal pneumonia. He was initially hypertensive with close O2 sats of 89% and a temp of 102.9. Heart rate has continued to be elevated 96-102. Respiratory rate is rapid at 29. Blood pressure has decreased to 102/63. 12:28 PM Patient on BiPAP and sats 100%. Respiratory rate has decreased. Initial lactic acid elevated at 3.7. Chest x-Makai Agostinelli with some pulmonary vascular congestion and possible mild interstitial edema. I have requested an evening consult to assist with management 12:39 PM Patient care discussed with Dr. Tyson Alias on call for critical care. I consulted Dr. Tyson Alias as patient has chronic atrial fibrillation and now presents with concern for sepsis. Blood pressures are trending down. Dr. Tyson Alias and I agree that patient has chronic changes on chest x-Alyse Kathan and always appears to have some vascular congestion.  Given his elevated lactic acid will proceed with fluid bolus. He has received antibiotics. He has had some respiratory improvement on BiPAP. Plan transfer to [facility] to ICU for further care.  Sepsis - Repeat Assessment  Performed at:    12:42 PM   Vitals     Blood pressure 102/63, pulse 98, temperature 102.9 F (39.4 C), temperature source Rectal, resp. rate 29, height  (1.727 m), weight 171.46 kg, SpO2 94 %.  Heart:     Irregular rate and rhythm  Lungs:    decreased bs with rhonchi  Capillary Refill:   <2 sec  Peripheral Pulse:   Radial pulse palpable  Skin:     Normal Color   I personally performed the services described in this documentation, which was scribed in my presence. The recorded information has been reviewed and considered.  CRITICAL CARE Performed by: Hilario Quarry Total critical care time: 60 minutes Critical care time was exclusive of separately billable procedures and treating other patients. Critical care was necessary to treat or prevent imminent or life-threatening deterioration. Critical care was time spent personally by me on the following activities: development of treatment plan with patient and/or surrogate as well as nursing, discussions with consultants, evaluation of patient's response to treatment, examination of patient, obtaining history from patient or surrogate, ordering and performing treatments and interventions, ordering and review of laboratory studies, ordering and review of radiographic studies, pulse oximetry and re-evaluation of patient's condition.   Margarita Grizzle, MD 01/03/16 [phone]

## 2016-01-03 NOTE — ED Notes (Signed)
Pt on Bipap. Resting comfortably, states he is feeling a lot better. RN notifying pt sister per request.

## 2016-01-03 NOTE — H&P (Addendum)
PULMONARY / CRITICAL CARE MEDICINE   Name: Jeremy Rush MRN: [id number] DOB: 18-Jan-1951    ADMISSION DATE:  01/03/2016   REFERRING MD:  EDP  CHIEF COMPLAINT: SOB/weakness  HISTORY OF PRESENT ILLNESS:    Jeremy Rush is a pleasant MO(397 lbs) with CAF on xarelto who developed fever(102.9 rectal) shortness of breath, tachycardia along with weakness. Seen at [facility] ED and treated with IVF and Abx with remarkable recovery. Transferred to [facility] ICU and placed on sepsis protocol.  PAST MEDICAL HISTORY :  He  has a past medical history of Morbid obesity (HCC); Hypertension; DJD (degenerative joint disease); Atrial fibrillation (HCC) (2014); and Hyperlipidemia.  PAST SURGICAL HISTORY: He  has past surgical history that includes Ventral hernia repair ([id number]); Abdominal debridement (2009); and Wrist surgery.  Allergies  Allergen Reactions  . Codeine Other (See Comments)    Mood swing  . Morphine And Related Other (See Comments)    Mood swing    No current facility-administered medications on file prior to encounter.   Current Outpatient Prescriptions on File Prior to Encounter  Medication Sig  . acetaminophen (TYLENOL) 500 MG tablet Take 500 mg by mouth every 6 (six) hours as needed for pain.  Marland Kitchen CARDIZEM LA 300 MG 24 hr tablet TAKE 1 TABLET (300 MG TOTAL) BY MOUTH DAILY.  . furosemide (LASIX) 20 MG tablet Take 1 tablet (20 mg total) by mouth daily.  Marland Kitchen lisinopril (PRINIVIL,ZESTRIL) 40 MG tablet Take 1 tablet (40 mg total) by mouth daily.  Marland Kitchen loratadine (CLARITIN) 10 MG tablet TAKE 1 TABLET BY MOUTH DAILY  . XARELTO 20 MG TABS tablet TAKE 1 TABLET BY MOUTH EVERY DAY    FAMILY HISTORY:  His indicated that his mother is alive. He indicated that his father is deceased.   SOCIAL HISTORY: He  reports that he has never smoked. He has never used smokeless tobacco. He reports that he does not drink alcohol or use illicit drugs.  REVIEW OF SYSTEMS:   10 point review of system taken,  please see HPI for positives and negatives.   SUBJECTIVE:  Arrives at 3 liters, no distress, feeling better, rvr  VITAL SIGNS: BP 98/60 mmHg  Pulse 89  Temp(Src) 98.2 F (36.8 C) (Rectal)  Resp 30  Ht 5\' 8"  (1.727 m)  Wt 378 lb (171.46 kg)  BMI 57.49 kg/m2  SpO2 96%  HEMODYNAMICS:    VENTILATOR SETTINGS:    INTAKE / OUTPUT:    PHYSICAL EXAMINATION: General: MO 397 ibs, NAD at rest Neuro: Intact HEENT:  Poor dentition, no jvd/lan  Cardiovascular: HSIR IR Afib Lungs:Decreased in bases Abdomen:  Pen size wound abd, dressing intact Musculoskeletal:  intact Skin: Lower ext venous statis changes. +++ edema  LABS:  BMET  Recent Labs Lab 01/03/16 1147  NA 141  K 3.8  CL 106  CO2 25  BUN 18  CREATININE 1.19  GLUCOSE 106*    Electrolytes  Recent Labs Lab 01/03/16 1147  CALCIUM 8.4*    CBC  Recent Labs Lab 01/03/16 1147  WBC 5.7  HGB 14.1  HCT 44.8  PLT 148*    Coag's No results for input(s): APTT, INR in the last 168 hours.  Sepsis Markers  Recent Labs Lab 01/03/16 1147  LATICACIDVEN 3.70*    ABG  Recent Labs Lab 01/03/16 1144  PHART 7.309*  PCO2ART 54.0*  PO2ART BELOW REPORTABLE RANGE     Liver Enzymes  Recent Labs Lab 01/03/16 1147  AST 22  ALT 12*  ALKPHOS 59  BILITOT 1.2  ALBUMIN 3.6    Cardiac Enzymes No results for input(s): TROPONINI, PROBNP in the last 168 hours.  Glucose No results for input(s): GLUCAP in the last 168 hours.  Imaging Dg Chest Portable 1 View  01/03/2016  CLINICAL DATA:  65 year old male with shortness of breath today. EXAM: PORTABLE CHEST 1 VIEW COMPARISON:  A 13 2016 and prior exams FINDINGS: Cardiomegaly and pulmonary vascular congestion noted. Mild interstitial opacities may represent mild interstitial edema. Bibasilar atelectasis is noted. No large pleural effusions or pneumothorax noted. IMPRESSION: Cardiomegaly with pulmonary vascular congestion and possible mild interstitial edema.  Electronically Signed   By: Harmon Pier M.D.   On: 01/03/2016 12:08     STUDIES:  CT abd/chest>>  CULTURES: 2/12 bc x 2>> 2/12 uc>>  ANTIBIOTICS: 2/12 roc>> 2/12 zithro>>  SIGNIFICANT EVENTS: 2/12 [facility] ER< shock, sepsis, 102.9 F, bipap distress  LINES/TUBES:   ASSESSMENT / PLAN:  PULMONARY A: MO 400 lbs  No report of OSA P:   O2 as needed CT chest ro PE  CARDIOVASCULAR A:  Hypotension - septic shock Afib with RVR Presumed sepsis  With elevated lactic acid, hypotension  P:  Sepsis protocol  Rate control   RENAL A:   No acute issue P:     GASTROINTESTINAL A:   GI protetion P:   PPI  HEMATOLOGIC A:   On xarelto for a fib P:  Careful with invasive procedures  INFECTIOUS A:   ? Cap Chronic open wound open abdP:   See ID section  ENDOCRINE A:   No acute issue  P:   Check TSH in afib pt  NEUROLOGIC A:   Intact P:   RASS goal: 0 Avoid sedatived in 400 lb male   FAMILY  - Updates:   - Inter-disciplinary family meet or Palliative Care meeting due by:  day 7    Steve Minor ACNP [facility] PCCM Pager [phone] till 3 pm If no answer page [phone] 01/03/2016, 2:59 PM   STAFF NOTE: I, Rory Percy, MD FACP have personally reviewed patient's available data, including medical history, events of note, physical examination and test results as part of my evaluation. I have discussed with resident/NP and other care providers such as pharmacist, RN and RRT. In addition, I personally evaluated patient and elicited key findings of: arrives in NO distress, coarse to clear lungs, no sig jvd , ABX and fluids provided at [facility], concern is sepsis, meets criteria with 2/4 sirs and infection concern, organ failure with resp failure, sepsis protocol, source abdomen wound?, unlikely PNA, his pcxr over years has always had interstitial prominence and may be all related to body size, would continued to push for 30 cc/kg stat given also he has improved  clinically with this treatment and so has his resp status, re evaluate rate control after bolus, may need cardizem infusion, consider CT abdo /pelvis for fistula? Abscess?, also add chest with such rapid symptoms he desribed, although on noac, his is large and i still have concerns on thrapeutic status, pos balance, repeat sepsis re assessment see below, no role pressors as of now, BP on arrival WNL, get cortisol, will add flagyl to abdo coverage and keep azithro until we have further id of source, if has borderline BP and rate up will add amio The patient is critically ill with multiple organ systems failure and requires high complexity decision making for assessment and support, frequent evaluation and titration of therapies, application of advanced monitoring  technologies and extensive interpretation of multiple databases.   Critical Care Time devoted to patient care services described in this note is 30 Minutes. This time reflects time of care of this signee: Rory Percy, MD FACP. This critical care time does not reflect procedure time, or teaching time or supervisory time of PA/NP/Med student/Med Resident etc but could involve care discussion time. Rest per NP/medical resident whose note is outlined above and that I agree with   Mcarthur Rossetti. Tyson Alias, MD, FACP Pgr: [phone] [place] 01/03/2016 3:37 PM  Sepsis - Repeat Assessment  Performed at:   325 pm 2/12   Vitals     Blood pressure 98/60, pulse 89, temperature 98.2 F (36.8 C), temperature source Rectal, resp. rate 30, height  (1.727 m), weight 171.46 kg (378 lb), SpO2 96 %.  Heart:     Irregular rate and rhythm and Tachycardic  Lungs:    CTA  Capillary Refill:   <2 sec  Peripheral Pulse:   Radial pulse palpable  Skin:     Normal Color

## 2016-01-04 ENCOUNTER — Encounter (HOSPITAL_COMMUNITY): Payer: Self-pay | Admitting: General Surgery

## 2016-01-04 LAB — CBC
HCT: 39.1 % (ref 39.0–52.0)
Hemoglobin: 12.3 g/dL — ABNORMAL LOW (ref 13.0–17.0)
MCH: 26.6 pg (ref 26.0–34.0)
MCHC: 31.5 g/dL (ref 30.0–36.0)
MCV: 84.4 fL (ref 78.0–100.0)
PLATELETS: 152 10*3/uL (ref 150–400)
RBC: 4.63 MIL/uL (ref 4.22–5.81)
RDW: 14.9 % (ref 11.5–15.5)
WBC: 19.6 10*3/uL — AB (ref 4.0–10.5)

## 2016-01-04 LAB — BASIC METABOLIC PANEL
Anion gap: 13 (ref 5–15)
BUN: 19 mg/dL (ref 6–20)
CALCIUM: 8.2 mg/dL — AB (ref 8.9–10.3)
CO2: 19 mmol/L — AB (ref 22–32)
CREATININE: 1.08 mg/dL (ref 0.61–1.24)
Chloride: 108 mmol/L (ref 101–111)
GFR calc Af Amer: >60 mL/min (ref >60)
GFR calc non Af Amer: >60 mL/min (ref >60)
GLUCOSE: 100 mg/dL — AB (ref 65–99)
Potassium: 5.3 mmol/L — ABNORMAL HIGH (ref 3.5–5.1)
Sodium: 140 mmol/L (ref 135–145)

## 2016-01-04 LAB — POCT I-STAT 3, ART BLOOD GAS (G3+)
ACID-BASE DEFICIT: 2 mmol/L (ref 0.0–2.0)
Bicarbonate: 24.4 mEq/L — ABNORMAL HIGH (ref 20.0–24.0)
O2 SAT: 90 %
PH ART: 7.32 — AB (ref 7.350–7.450)
Patient temperature: 97.6
TCO2: 26 mmol/L (ref 0–100)
pCO2 arterial: 46.9 mmHg — ABNORMAL HIGH (ref 35.0–45.0)
pO2, Arterial: 63 mmHg — ABNORMAL LOW (ref 80.0–100.0)

## 2016-01-04 LAB — MAGNESIUM: MAGNESIUM: 1.9 mg/dL (ref 1.7–2.4)

## 2016-01-04 LAB — GLUCOSE, CAPILLARY
GLUCOSE-CAPILLARY: 138 mg/dL — AB (ref 65–99)
Glucose-Capillary: 99 mg/dL (ref 65–99)
Glucose-Capillary: 99 mg/dL (ref 65–99)

## 2016-01-04 LAB — URINE CULTURE

## 2016-01-04 LAB — PHOSPHORUS: Phosphorus: 3.5 mg/dL (ref 2.5–4.6)

## 2016-01-04 LAB — TSH: TSH: 1.408 u[IU]/mL (ref 0.350–4.500)

## 2016-01-04 LAB — LIPASE, BLOOD: LIPASE: 18 U/L (ref 11–51)

## 2016-01-04 MED ORDER — LORATADINE 10 MG PO TABS
10.0000 mg | ORAL_TABLET | Freq: Every day | ORAL | Status: DC
  Administered 2016-01-04 – 2016-01-06 (×3): 10 mg via ORAL
  Filled 2016-01-04 (×3): qty 1

## 2016-01-04 MED ORDER — MAGNESIUM SULFATE 2 GM/50ML IV SOLN
2.0000 g | Freq: Once | INTRAVENOUS | Status: AC
  Administered 2016-01-04: 2 g via INTRAVENOUS
  Filled 2016-01-04: qty 50

## 2016-01-04 MED ORDER — ACETAMINOPHEN 325 MG PO TABS
650.0000 mg | ORAL_TABLET | ORAL | Status: DC | PRN
  Administered 2016-01-04: 650 mg via ORAL
  Filled 2016-01-04: qty 2

## 2016-01-04 NOTE — Care Management Note (Signed)
Case Management Note  Patient Details  Name: Jeremy Rush MRN: [id number] Date of Birth: [birth year]  Subjective/Objective:    Pt admitted with SOB - required BIPAP before weaning on [place]        Action/Plan:  Pt is independent from home with sister and brother in law.  CM will continue to monitor for disposition needs   Expected Discharge Date:                  Expected Discharge Plan:  Home/Self Care  In-House Referral:     Discharge planning Services  CM Consult  Post Acute Care Choice:    Choice offered to:  Patient  DME Arranged:    DME Agency:     HH Arranged:    HH Agency:     Status of Service:  In process, will continue to follow  Medicare Important Message Given:    Date Medicare IM Given:    Medicare IM give by:    Date Additional Medicare IM Given:    Additional Medicare Important Message give by:     If discussed at Long Length of Stay Meetings, dates discussed:    Additional Comments:  Cherylann Parr, RN 01/04/2016, 1:13 PM

## 2016-01-04 NOTE — Clinical Documentation Improvement (Signed)
Critical Care  Can the statement of "He has had some respiratory improvement on BiPAP'" be further specified? Please provide a diagnosis associated with the initiation of BIPAP and document findings in next progress note NOT in BPA drop down box.   Other  Clinically Undetermined  Supporting Information:  Respiratory rates ranging from 15 to 26  Weaned from BIPAP to 5 to 6L FiO2  Please exercise your independent, professional judgment when responding. A specific answer is not anticipated or expected.  Thank You, Shellee Milo RN, BSN, CCDS Health Information Management [place] [phone]; Cell: [phone]

## 2016-01-04 NOTE — Progress Notes (Signed)
UR Completed. Talitha Dicarlo, RN, BSN.  [phone] 

## 2016-01-04 NOTE — Clinical Documentation Improvement (Signed)
Critical Care  Can the diagnosis of Shock be further specified? CDI/Coders cannot assume any diagnosis; must be documented. Please document response in next progress note NOT in BPA drop down box. Thank you!   Shock, including Type:  Septic, Cardiogenic, Hypovolemic including suspected or known cause and/or associated condition(s)  Other Type of Shock  Clinically Undetermined  Document any associated diagnoses/conditions.  Supporting Information:  "2/12 [facility] ER< shock, sepsis, 102.9 F, bipap distress"   IV Zithromax, Rocephin, Flagyl - q6h initiated  Neo-synephrine gtt also initiated  BP's running 80/54 with Map of 62, 86/59 Map of 65  Please exercise your independent, professional judgment when responding. A specific answer is not anticipated or expected.  Thank You,  Shellee Milo Health Information Management [place] [phone]

## 2016-01-04 NOTE — Progress Notes (Signed)
PULMONARY / CRITICAL CARE MEDICINE   Name: Jeremy Rush MRN: [id number] DOB: 1951-05-22    ADMISSION DATE:  01/03/2016   REFERRING MD:  EDP  CHIEF COMPLAINT: SOB/weakness  HISTORY OF PRESENT ILLNESS:   Jeremy Rush is a pleasant MO(397 lbs) with CAF on xarelto who developed fever(102.9 rectal) shortness of breath, tachycardia along with weakness. Seen at [facility] ED and treated with IVF and Abx with remarkable recovery. Transferred to [facility] ICU and placed on sepsis protocol.   SUBJECTIVE:  No overnight events.  [place] 4L  VITAL SIGNS: BP 94/73 mmHg  Pulse 86  Temp(Src) 97 F (36.1 C) (Oral)  Resp 24  Ht 5\' 8"  (1.727 m)  Wt 378 lb (171.46 kg)  BMI 57.49 kg/m2  SpO2 96%  HEMODYNAMICS:    VENTILATOR SETTINGS:    INTAKE / OUTPUT: I/O last 3 completed shifts: In: 78 [Other:120; IV Piggyback:5300] Out: 925 [Urine:925]  PHYSICAL EXAMINATION:  General: MO 397 ibs, NAD at rest Neuro: Intact HEENT:  Poor dentition, no jvd/lan  Cardiovascular: irregular irregular, Lungs:Decreased in bases Abdomen:  Pen size wound abd, dressing intact Musculoskeletal:  intact Skin: Lower ext venous statis changes. edema  LABS:  BMET  Recent Labs Lab 01/03/16 1147  NA 141  K 3.8  CL 106  CO2 25  BUN 18  CREATININE 1.19  GLUCOSE 106*    Electrolytes  Recent Labs Lab 01/03/16 1147 01/03/16 1600  CALCIUM 8.4*  --   MG  --  1.5*  PHOS  --  1.9*    CBC  Recent Labs Lab 01/03/16 1147 01/03/16 1600 01/04/16 0229  WBC 5.7 15.6* 19.6*  HGB 14.1 12.8* 12.3*  HCT 44.8 40.7 39.1  PLT 148* 144* 152    Coag's  Recent Labs Lab 01/03/16 1559  APTT 35  INR 2.77*    Sepsis Markers  Recent Labs Lab 01/03/16 1147 01/03/16 1540 01/03/16 1559 01/03/16 1843  LATICACIDVEN 3.70* 1.3  --  1.5  PROCALCITON  --   --  30.83  --     ABG  Recent Labs Lab 01/03/16 1144 01/04/16 0252  PHART 7.309* 7.320*  PCO2ART 54.0* 46.9*  PO2ART BELOW REPORTABLE RANGE  63.0*     Liver Enzymes  Recent Labs Lab 01/03/16 1147  AST 22  ALT 12*  ALKPHOS 59  BILITOT 1.2  ALBUMIN 3.6    Cardiac Enzymes  Recent Labs Lab 01/03/16 1559  TROPONINI <0.03    Glucose  Recent Labs Lab 01/03/16 1626 01/03/16 2017 01/04/16 0027  GLUCAP 89 113* 138*    Imaging Ct Angio Chest Pe W/cm &/or Wo Cm  01/04/2016  CLINICAL DATA:  Shortness of breath. History of hypertension and morbid obesity. EXAM: CT ANGIOGRAPHY CHEST CT ABDOMEN AND PELVIS WITH CONTRAST TECHNIQUE: Multidetector CT imaging of the chest was performed using the standard protocol during bolus administration of intravenous contrast. Multiplanar CT image reconstructions and MIPs were obtained to evaluate the vascular anatomy. Multidetector CT imaging of the abdomen and pelvis was performed using the standard protocol during bolus administration of intravenous contrast. CONTRAST:  OMNIPAQUE IOHEXOL 350 MG/ML SOLN COMPARISON:  CT chest 07/04/2015.  CT abdomen and pelvis 01/14/2008. FINDINGS: CTA CHEST FINDINGS Dilated ascending thoracic aorta measuring 4.9 cm AP diameter, similar to prior study. No aortic dissection. Great vessel origins are patent. Descending thoracic aorta is patent. Normal heart size. Minimal pericardial effusion. Central pulmonary arteries are moderately well opacified without large pulmonary embolus. Contrast bolus timing limits evaluation of peripheral pulmonary  arteries. Esophagus is decompressed. No significant lymphadenopathy in the chest. Minimal atelectasis in the lung bases. No pleural effusions. No pneumothorax. Linear scarring in the right mid lung. CT ABDOMEN and PELVIS FINDINGS Examination is limited due patient's body habitus. The liver, spleen, gallbladder, adrenal glands, kidneys, abdominal aorta, inferior vena cava, and retroperitoneal lymph nodes are unremarkable. Mild fatty infiltration of the pancreas. Mild infiltration of the peripancreatic fat extending up around the  celiac axis. This is nonspecific but could indicate early acute pancreatitis. No pancreatic ductal dilatation or fluid collection. Stomach, small bowel, and colon are decompressed but contrast material is seen in the colon suggesting no evidence of small bowel obstruction. Laxity of the anterior abdominal wall. No free air or free fluid in the abdomen. Pelvis: The prostate gland is enlarged. Bladder wall is not thickened. No free or loculated pelvic fluid collections. No pelvic mass or lymphadenopathy. The appendix is normal. Degenerative changes throughout the thoracic and lumbar spine. No destructive bone lesions. Review of the MIP images confirms the above findings. IMPRESSION: Dilated ascending thoracic aorta measuring 4.9 cm AP diameter without change since prior study. No dissection. No large central pulmonary emboli are visualized although contrast bolus limits evaluation of smaller peripheral pulmonary arteries. Minimal pericardial effusion. No evidence of active pulmonary disease. Mild infiltration in the peripancreatic fat which could indicate early acute pancreatitis. Correlation with clinical examination and laboratory evaluation suggested. Enlarged prostate gland. Electronically Signed   By: Burman Nieves M.D.   On: 01/04/2016 01:30   Ct Abdomen Pelvis W Contrast  01/04/2016  CLINICAL DATA:  Shortness of breath. History of hypertension and morbid obesity. EXAM: CT ANGIOGRAPHY CHEST CT ABDOMEN AND PELVIS WITH CONTRAST TECHNIQUE: Multidetector CT imaging of the chest was performed using the standard protocol during bolus administration of intravenous contrast. Multiplanar CT image reconstructions and MIPs were obtained to evaluate the vascular anatomy. Multidetector CT imaging of the abdomen and pelvis was performed using the standard protocol during bolus administration of intravenous contrast. CONTRAST:  OMNIPAQUE IOHEXOL 350 MG/ML SOLN COMPARISON:  CT chest 07/04/2015.  CT abdomen and  pelvis 01/14/2008. FINDINGS: CTA CHEST FINDINGS Dilated ascending thoracic aorta measuring 4.9 cm AP diameter, similar to prior study. No aortic dissection. Great vessel origins are patent. Descending thoracic aorta is patent. Normal heart size. Minimal pericardial effusion. Central pulmonary arteries are moderately well opacified without large pulmonary embolus. Contrast bolus timing limits evaluation of peripheral pulmonary arteries. Esophagus is decompressed. No significant lymphadenopathy in the chest. Minimal atelectasis in the lung bases. No pleural effusions. No pneumothorax. Linear scarring in the right mid lung. CT ABDOMEN and PELVIS FINDINGS Examination is limited due patient's body habitus. The liver, spleen, gallbladder, adrenal glands, kidneys, abdominal aorta, inferior vena cava, and retroperitoneal lymph nodes are unremarkable. Mild fatty infiltration of the pancreas. Mild infiltration of the peripancreatic fat extending up around the celiac axis. This is nonspecific but could indicate early acute pancreatitis. No pancreatic ductal dilatation or fluid collection. Stomach, small bowel, and colon are decompressed but contrast material is seen in the colon suggesting no evidence of small bowel obstruction. Laxity of the anterior abdominal wall. No free air or free fluid in the abdomen. Pelvis: The prostate gland is enlarged. Bladder wall is not thickened. No free or loculated pelvic fluid collections. No pelvic mass or lymphadenopathy. The appendix is normal. Degenerative changes throughout the thoracic and lumbar spine. No destructive bone lesions. Review of the MIP images confirms the above findings. IMPRESSION: Dilated ascending thoracic aorta measuring  4.9 cm AP diameter without change since prior study. No dissection. No large central pulmonary emboli are visualized although contrast bolus limits evaluation of smaller peripheral pulmonary arteries. Minimal pericardial effusion. No evidence of active  pulmonary disease. Mild infiltration in the peripancreatic fat which could indicate early acute pancreatitis. Correlation with clinical examination and laboratory evaluation suggested. Enlarged prostate gland. Electronically Signed   By: Burman Nieves M.D.   On: 01/04/2016 01:30   Dg Chest Portable 1 View  01/03/2016  CLINICAL DATA:  65 year old male with shortness of breath today. EXAM: PORTABLE CHEST 1 VIEW COMPARISON:  A 13 2016 and prior exams FINDINGS: Cardiomegaly and pulmonary vascular congestion noted. Mild interstitial opacities may represent mild interstitial edema. Bibasilar atelectasis is noted. No large pleural effusions or pneumothorax noted. IMPRESSION: Cardiomegaly with pulmonary vascular congestion and possible mild interstitial edema. Electronically Signed   By: Harmon Pier M.D.   On: 01/03/2016 12:08     STUDIES:  CT abd/chest>> dilated ascending aorta (unchanged); minimal pericardial effusion. Possible early pancreatitis.  CT Angio >> no large central PE noticed  CULTURES: 2/12 bc x 2>> 2/12 uc>>  ANTIBIOTICS: 2/12 roc>> 2/12 zithro>> 2/12 Flagyl >>  SIGNIFICANT EVENTS: 2/12 [facility] ER< shock, sepsis, 102.9 F, bipap distress  LINES/TUBES: PIVs  ASSESSMENT / PLAN:  PULMONARY A: MO 400 lbs  No report of OSA Tolerated fluids just fine P:   O2 as needed - currently on 4L [place] IMPROVED with fluids  CARDIOVASCULAR A:  Hypotension Afib with RVR Presumed sepsis with elevated lactic acid, hypotension  P:  Sepsis protocol  Rate control prn  Neo infusion for BP control - off Xarelto for anticoagulation No bolus further  RENAL A:   Hypomag P:   Did well with pos balance Off pressors kvo bmet in am   GASTROINTESTINAL A:   GI protection, chronic wound drainage P:   PPI Diet tolerated Ct reviewed  HEMATOLOGIC A:   On xarelto for a fib P:  Careful with invasive procedures crt in am   INFECTIOUS A:   NO PNA Chronic open wound open abd - only  source left with P:   P: Flagyl for abdominal coverage - neg dt, dc On ceftriaxone and azithromycin >> deescalate based on source Dc azithro CT abdo /pelvis with no signs of fistula or abscess  ENDOCRINE A:   No acute issue  P:   Check TSH in afib pt   NEUROLOGIC A:   Intact P:   RASS goal: 0 Avoid sedatived in 400 lb male   FAMILY  - Updates:  - Inter-disciplinary family meet or Palliative Care meeting due by:  day 7  Jeremy Ada, DO 01/04/2016, 7:33 AM PGY-2, [facility]    STAFF NOTE: I, Jeremy Percy, MD FACP have personally reviewed patient's available data, including medical history, events of note, physical examination and test results as part of my evaluation. I have discussed with resident/NP and other care providers such as pharmacist, RN and RRT. In addition, I personally evaluated patient and elicited key findings of: CT chest neg, no PNA source, C Tabdo neg , UA neg, legs neg, doubt pancrease source, ensure lipase, amy, wound abdo only think left with although no abscess on CT, was seeing Dr Jeremy Rush, will cal gen surgery to ensure dont need to open this further on skin, dc azithro, dc flagyl as no intrabdo source, maintain ceftriaxone, mag supp, kvo, did well with full 30 cc/kg, PCT noted, was brady overall no rate  control as of now, oral home meds likley start in am   To triad, tele   Jeremy Rush. Jeremy Alias, MD, FACP Pgr: [phone] [place] [facility] 01/04/2016 10:53 AM

## 2016-01-04 NOTE — Consult Note (Signed)
[facility] Surgery Consult Note  Jeremy Rush [birth year]  [id number].    Requesting MD: Dr. Titus Mould Chief Complaint/Reason for Consult: Chronically draining abdominal wall wound  HPI:  65 y/o morbidly obese male well known to Dr. Lucia Gaskins with PMH Ventral herniar repair with mesh done by Dr. Annamaria Boots around 1999.  Since then he has had multiple I&D's of this site by Dr. Lucia Gaskins (Feb 2009 and Sept 2009).  He see's Dr. Lucia Gaskins every 6 months and he usually does bedside debridements and opens the wound wider so it can adequately drain.  Chronically draining abdominal wall wound with mesh at the base of the abscess.  He denies any pain to the site, no increase in drainage.  He usually notes clear to clear brown drainage and sometimes blood from the pinhole.  He says its closed up quite a bit.  He denies any abdominal pain, distension.  His bowels are moving regularly.  No abscess noted on the CT scan.   WBC is 19.6 and he was sent here from an outside hospital due to sepsis of unknown origin.  He had presented [facility]  For fever, SOB, tachycardia, and weakness.  The ICU team asked [place] to evaluate his chronic wound because they could not find a source of his sepsis.  He tells me he has poor dentition and has been trying to get the rest of his teeth pulled because they are fractured and decayed.  No current CP, mild SOB on [place], no N/V/D.    ROS: All systems reviewed and otherwise negative except for as above  Family History  Problem Relation Age of Onset  . Family history unknown: Yes    Past Medical History  Diagnosis Date  . Morbid obesity ([place])   . Hypertension   . DJD (degenerative joint disease)     Knees  . Atrial fibrillation ([place]) 2014    Initial diagnosis in 12/2012  . Hyperlipidemia   . Chronic abdominal wound infection [facility])     Past Surgical History  Procedure Laterality Date  . Ventral hernia repair  1990s-2000    X4  . Abdominal debridement  2009    Dr. Alphonsa Overall, infected  mesh/abscess  . Wrist surgery      Right    Social History:  reports that he has never smoked. He has never used smokeless tobacco. He reports that he does not drink alcohol or use illicit drugs.  Allergies:  Allergies  Allergen Reactions  . Codeine Other (See Comments)    Mood swing  . Morphine And Related Other (See Comments)    Mood swing    Medications Prior to Admission  Medication Sig Dispense Refill  . acetaminophen (TYLENOL) 500 MG tablet Take 500 mg by mouth every 6 (six) hours as needed for pain.    Marland Kitchen CARDIZEM LA 300 MG 24 hr tablet TAKE 1 TABLET (300 MG TOTAL) BY MOUTH DAILY. 30 tablet 6  . furosemide (LASIX) 20 MG tablet Take 1 tablet (20 mg total) by mouth daily. 30 tablet 11  . lisinopril (PRINIVIL,ZESTRIL) 40 MG tablet Take 1 tablet (40 mg total) by mouth daily. 90 tablet 3  . loratadine (CLARITIN) 10 MG tablet TAKE 1 TABLET BY MOUTH DAILY 30 tablet 3  . XARELTO 20 MG TABS tablet TAKE 1 TABLET BY MOUTH EVERY DAY 30 tablet 6    Blood pressure 106/85, pulse 60, temperature 97 F (36.1 C), temperature source Oral, resp. rate 18, height _0  (1.727 m), weight 171.46  kg (378 lb), SpO2 96 %. Physical Exam: General: pleasant, extreme obesity, WD/WN white male who is laying in bed in NAD HEENT: head is normocephalic, atraumatic.  Sclera are noninjected.  PERRL.  Ears and nose without any masses or lesions.  Mouth is pink and moist.  Poor dentition, multiple fractured decaying teeth.  Gum's are not very healthy appearing.   Abd: Very rotund, central obesity, soft, NT/ND, pinhole in chronic abdominal wound in the lower abdomen draining brown serous drainage, +BS, no masses, or organomegaly, chronic erythematous rash to mid abdomen.  After I probed it and opened the wound to 1cm it immediately drained dark sanguinous drainage.  NO foul smell or purulent drainage.  Chronic changes to skin in this area.  Depth of wound was only 2cm deep. Psych: A&Ox3 with an appropriate  affect.   Results for orders placed or performed during the hospital encounter of 01/03/16 (from the past 48 hour(s))  Urinalysis, Routine w reflex microscopic (not at [facility])     Status: Abnormal   Collection Time: 01/03/16 11:27 AM  Result Value Ref Range   Color, Urine YELLOW YELLOW   APPearance CLEAR CLEAR   Specific Gravity, Urine 1.010 1.005 - 1.030   pH 5.0 5.0 - 8.0   Glucose, UA NEGATIVE NEGATIVE mg/dL   Hgb urine dipstick TRACE (A) NEGATIVE   Bilirubin Urine NEGATIVE NEGATIVE   Ketones, ur NEGATIVE NEGATIVE mg/dL   Protein, ur NEGATIVE NEGATIVE mg/dL   Nitrite NEGATIVE NEGATIVE   Leukocytes, UA NEGATIVE NEGATIVE  Urine culture     Status: None (Preliminary result)   Collection Time: 01/03/16 11:27 AM  Result Value Ref Range   Specimen Description URINE, CLEAN CATCH    Special Requests NONE    Culture      TOO YOUNG TO READ Performed at [facility]    Report Status PENDING   Urine microscopic-add on     Status: Abnormal   Collection Time: 01/03/16 11:27 AM  Result Value Ref Range   Squamous Epithelial / LPF 0-5 (A) NONE SEEN   WBC, UA 0-5 0 - 5 WBC/hpf   RBC / HPF 0-5 0 - 5 RBC/hpf   Bacteria, UA RARE (A) NONE SEEN  Blood Culture (routine x 2)     Status: None (Preliminary result)   Collection Time: 01/03/16 11:35 AM  Result Value Ref Range   Specimen Description BLOOD RIGHT HAND    Special Requests BOTTLES DRAWN AEROBIC AND ANAEROBIC 6CC EACH    Culture NO GROWTH < 24 HOURS    Report Status PENDING   Blood gas, arterial ([facility] & [facility] ONLY)     Status: Abnormal   Collection Time: 01/03/16 11:44 AM  Result Value Ref Range   O2 Content 6.0 L/min   pH, Arterial 7.309 (L) 7.350 - 7.450   pCO2 arterial 54.0 (H) 35.0 - 45.0 mmHg   pO2, Arterial BELOW REPORTABLE RANGE  80.0 - 100.0 mmHg    Comment:  BLAIR VOGLER RN BY ROBIN POWELL RRT ON 01/03/16 AT 1151   Bicarbonate 22.7 20.0 - 24.0 mEq/L   Acid-Base Excess 0.7 0.0 - 2.0 mmol/L   O2 Saturation 44.4 %     Comment: CRITICAL RESULT CALLED TO, READ BACK BY AND VERIFIED WITH:  BLAIR VOGLER RN BY ROBIN POWELL RRT ON 01/03/16 AT 1151    Collection site LEFT RADIAL    Drawn by [id number]    Sample type ARTERIAL    Allens test (pass/fail) PASS PASS  CBC  with Differential/Platelet     Status: Abnormal   Collection Time: 01/03/16 11:47 AM  Result Value Ref Range   WBC 5.7 4.0 - 10.5 K/uL   RBC 5.32 4.22 - 5.81 MIL/uL   Hemoglobin 14.1 13.0 - 17.0 g/dL   HCT 44.8 39.0 - 52.0 %   MCV 84.2 78.0 - 100.0 fL   MCH 26.5 26.0 - 34.0 pg   MCHC 31.5 30.0 - 36.0 g/dL   RDW 14.8 11.5 - 15.5 %   Platelets 148 (L) 150 - 400 K/uL   Neutrophils Relative % 92 %   Neutro Abs 5.3 1.7 - 7.7 K/uL   Lymphocytes Relative 5 %   Lymphs Abs 0.3 (L) 0.7 - 4.0 K/uL   Monocytes Relative 2 %   Monocytes Absolute 0.1 0.1 - 1.0 K/uL   Eosinophils Relative 1 %   Eosinophils Absolute 0.1 0.0 - 0.7 K/uL   Basophils Relative 0 %   Basophils Absolute 0.0 0.0 - 0.1 K/uL  Comprehensive metabolic panel     Status: Abnormal   Collection Time: 01/03/16 11:47 AM  Result Value Ref Range   Sodium 141 135 - 145 mmol/L   Potassium 3.8 3.5 - 5.1 mmol/L   Chloride 106 101 - 111 mmol/L   CO2 25 22 - 32 mmol/L   Glucose, Bld 106 (H) 65 - 99 mg/dL   BUN 18 6 - 20 mg/dL   Creatinine, Ser 1.19 0.61 - 1.24 mg/dL   Calcium 8.4 (L) 8.9 - 10.3 mg/dL   Total Protein 7.3 6.5 - 8.1 g/dL   Albumin 3.6 3.5 - 5.0 g/dL   AST 22 15 - 41 U/L   ALT 12 (L) 17 - 63 U/L   Alkaline Phosphatase 59 38 - 126 U/L   Total Bilirubin 1.2 0.3 - 1.2 mg/dL   GFR calc non Af Amer >60 >60 mL/min   GFR calc Af Amer >60 >60 mL/min    Comment: (NOTE) The eGFR has been calculated using the CKD EPI equation. This calculation has not been validated in all clinical situations. eGFR's persistently <60 mL/min signify possible Chronic Kidney Disease.    Anion gap 10 5 - 15  D-dimer, quantitative (not at [facility])     Status: Abnormal   Collection Time: 01/03/16 11:47 AM   Result Value Ref Range   D-Dimer, Quant 0.53 (H) 0.00 - 0.50 ug/mL-FEU    Comment: (NOTE) At the manufacturer cut-off of 0.50 ug/mL FEU, this assay has been documented to exclude PE with a sensitivity and negative predictive value of 97 to 99%.  At this time, this assay has not been approved by the FDA to exclude DVT/VTE. Results should be correlated with clinical presentation.   I-Stat CG4 Lactic Acid, ED  (not at  [facility])     Status: Abnormal   Collection Time: 01/03/16 11:47 AM  Result Value Ref Range   Lactic Acid, Venous 3.70 (HH) 0.5 - 2.0 mmol/L   Comment NOTIFIED PHYSICIAN   Blood Culture (routine x 2)     Status: None (Preliminary result)   Collection Time: 01/03/16 11:47 AM  Result Value Ref Range   Specimen Description BLOOD RIGHT HAND    Special Requests BOTTLES DRAWN AEROBIC AND ANAEROBIC 8CC EACH    Culture NO GROWTH < 24 HOURS    Report Status PENDING   Brain natriuretic peptide     Status: Abnormal   Collection Time: 01/03/16 11:47 AM  Result Value Ref Range   B Natriuretic Peptide 162.0 (  H) 0.0 - 100.0 pg/mL  I-stat troponin, ED     Status: None   Collection Time: 01/03/16 11:50 AM  Result Value Ref Range   Troponin i, poc 0.00 0.00 - 0.08 ng/mL   Comment 3            Comment: Due to the release kinetics of cTnI, a negative result within the first hours of the onset of symptoms does not rule out myocardial infarction with certainty. If myocardial infarction is still suspected, repeat the test at appropriate intervals.   Type and screen [place]     Status: None   Collection Time: 01/03/16  3:31 PM  Result Value Ref Range   ABO/RH(D) O POS    Antibody Screen NEG    Sample Expiration 01/06/2016   MRSA PCR Screening     Status: None   Collection Time: 01/03/16  3:32 PM  Result Value Ref Range   MRSA by PCR NEGATIVE NEGATIVE    Comment:        The GeneXpert MRSA Assay (FDA approved for NASAL specimens only), is one component of  a comprehensive MRSA colonization surveillance program. It is not intended to diagnose MRSA infection nor to guide or monitor treatment for MRSA infections.   Lactic acid, plasma     Status: None   Collection Time: 01/03/16  3:40 PM  Result Value Ref Range   Lactic Acid, Venous 1.3 0.5 - 2.0 mmol/L  ABO/Rh     Status: None   Collection Time: 01/03/16  3:45 PM  Result Value Ref Range   ABO/RH(D) O POS   Troponin I     Status: None   Collection Time: 01/03/16  3:59 PM  Result Value Ref Range   Troponin I <0.03 <0.031 ng/mL    Comment:        NO INDICATION OF MYOCARDIAL INJURY.   Protime-INR     Status: Abnormal   Collection Time: 01/03/16  3:59 PM  Result Value Ref Range   Prothrombin Time 28.8 (H) 11.6 - 15.2 seconds   INR 2.77 (H) 0.00 - 1.49  Procalcitonin     Status: None   Collection Time: 01/03/16  3:59 PM  Result Value Ref Range   Procalcitonin 30.83 ng/mL    Comment:        Interpretation: PCT >= 10 ng/mL: Important systemic inflammatory response, almost exclusively due to severe bacterial sepsis or septic shock. (NOTE)         ICU PCT Algorithm               Non ICU PCT Algorithm    ----------------------------     ------------------------------         PCT < 0.25 ng/mL                 PCT < 0.1 ng/mL     Stopping of antibiotics            Stopping of antibiotics       strongly encouraged.               strongly encouraged.    ----------------------------     ------------------------------       PCT level decrease by               PCT < 0.25 ng/mL       >= 80% from peak PCT       OR PCT 0.25 - 0.5 ng/mL  Stopping of antibiotics                                             encouraged.     Stopping of antibiotics           encouraged.    ----------------------------     ------------------------------       PCT level decrease by              PCT >= 0.25 ng/mL       < 80% from peak PCT        AND PCT >= 0.5 ng/mL             Continuing antibiotics                                               encouraged.       Continuing antibiotics            encouraged.    ----------------------------     ------------------------------     PCT level increase compared          PCT > 0.5 ng/mL         with peak PCT AND          PCT >= 0.5 ng/mL             Escalation of antibiotics                                          strongly encouraged.      Escalation of antibiotics        strongly encouraged.   APTT     Status: None   Collection Time: 01/03/16  3:59 PM  Result Value Ref Range   aPTT 35 24 - 37 seconds  Cortisol     Status: None   Collection Time: 01/03/16  4:00 PM  Result Value Ref Range   Cortisol, Plasma 18.0 ug/dL    Comment: (NOTE) AM    6.7 - 22.6 ug/dL PM   <10.0       ug/dL   Magnesium     Status: Abnormal   Collection Time: 01/03/16  4:00 PM  Result Value Ref Range   Magnesium 1.5 (L) 1.7 - 2.4 mg/dL  Phosphorus     Status: Abnormal   Collection Time: 01/03/16  4:00 PM  Result Value Ref Range   Phosphorus 1.9 (L) 2.5 - 4.6 mg/dL  CBC     Status: Abnormal   Collection Time: 01/03/16  4:00 PM  Result Value Ref Range   WBC 15.6 (H) 4.0 - 10.5 K/uL   RBC 4.88 4.22 - 5.81 MIL/uL   Hemoglobin 12.8 (L) 13.0 - 17.0 g/dL   HCT 40.7 39.0 - 52.0 %   MCV 83.4 78.0 - 100.0 fL   MCH 26.2 26.0 - 34.0 pg   MCHC 31.4 30.0 - 36.0 g/dL   RDW 14.8 11.5 - 15.5 %   Platelets 144 (L) 150 - 400 K/uL  Glucose, capillary     Status: None   Collection Time: 01/03/16  4:26 PM  Result Value Ref Range   Glucose-Capillary 89  65 - 99 mg/dL  Strep pneumoniae urinary antigen     Status: None   Collection Time: 01/03/16  6:12 PM  Result Value Ref Range   Strep Pneumo Urinary Antigen NEGATIVE NEGATIVE    Comment:        Infection due to S. pneumoniae cannot be absolutely ruled out since the antigen present may be below the detection limit of the test.   Lactic acid, plasma     Status: None   Collection Time: 01/03/16  6:43 PM  Result Value  Ref Range   Lactic Acid, Venous 1.5 0.5 - 2.0 mmol/L  Glucose, capillary     Status: Abnormal   Collection Time: 01/03/16  8:17 PM  Result Value Ref Range   Glucose-Capillary 113 (H) 65 - 99 mg/dL   Comment 1 Notify RN   Glucose, capillary     Status: Abnormal   Collection Time: 01/04/16 12:27 AM  Result Value Ref Range   Glucose-Capillary 138 (H) 65 - 99 mg/dL   Comment 1 Notify RN   CBC     Status: Abnormal   Collection Time: 01/04/16  2:29 AM  Result Value Ref Range   WBC 19.6 (H) 4.0 - 10.5 K/uL   RBC 4.63 4.22 - 5.81 MIL/uL   Hemoglobin 12.3 (L) 13.0 - 17.0 g/dL   HCT 39.1 39.0 - 52.0 %   MCV 84.4 78.0 - 100.0 fL   MCH 26.6 26.0 - 34.0 pg   MCHC 31.5 30.0 - 36.0 g/dL   RDW 14.9 11.5 - 15.5 %   Platelets 152 150 - 400 K/uL  I-STAT 3, arterial blood gas (G3+)     Status: Abnormal   Collection Time: 01/04/16  2:52 AM  Result Value Ref Range   pH, Arterial 7.320 (L) 7.350 - 7.450   pCO2 arterial 46.9 (H) 35.0 - 45.0 mmHg   pO2, Arterial 63.0 (L) 80.0 - 100.0 mmHg   Bicarbonate 24.4 (H) 20.0 - 24.0 mEq/L   TCO2 26 0 - 100 mmol/L   O2 Saturation 90.0 %   Acid-base deficit 2.0 0.0 - 2.0 mmol/L   Patient temperature 97.6 F    Collection site RADIAL, ALLEN'S TEST ACCEPTABLE    Drawn by RT    Sample type ARTERIAL   Glucose, capillary     Status: None   Collection Time: 01/04/16  7:20 AM  Result Value Ref Range   Glucose-Capillary 99 65 - 99 mg/dL  Magnesium     Status: None   Collection Time: 01/04/16  8:21 AM  Result Value Ref Range   Magnesium 1.9 1.7 - 2.4 mg/dL  Phosphorus     Status: None   Collection Time: 01/04/16  8:21 AM  Result Value Ref Range   Phosphorus 3.5 2.5 - 4.6 mg/dL  Basic metabolic panel     Status: Abnormal   Collection Time: 01/04/16  8:21 AM  Result Value Ref Range   Sodium 140 135 - 145 mmol/L   Potassium 5.3 (H) 3.5 - 5.1 mmol/L   Chloride 108 101 - 111 mmol/L   CO2 19 (L) 22 - 32 mmol/L   Glucose, Bld 100 (H) 65 - 99 mg/dL   BUN 19 6 -  20 mg/dL   Creatinine, Ser 1.08 0.61 - 1.24 mg/dL   Calcium 8.2 (L) 8.9 - 10.3 mg/dL   GFR calc non Af Amer >60 >60 mL/min   GFR calc Af Amer >60 >60 mL/min    Comment: (NOTE) The eGFR has been  calculated using the CKD EPI equation. This calculation has not been validated in all clinical situations. eGFR's persistently <60 mL/min signify possible Chronic Kidney Disease.    Anion gap 13 5 - 15  Glucose, capillary     Status: None   Collection Time: 01/04/16 11:37 AM  Result Value Ref Range   Glucose-Capillary 99 65 - 99 mg/dL   Ct Angio Chest Pe W/cm &/or Wo Cm  01/04/2016  CLINICAL DATA:  Shortness of breath. History of hypertension and morbid obesity. EXAM: CT ANGIOGRAPHY CHEST CT ABDOMEN AND PELVIS WITH CONTRAST TECHNIQUE: Multidetector CT imaging of the chest was performed using the standard protocol during bolus administration of intravenous contrast. Multiplanar CT image reconstructions and MIPs were obtained to evaluate the vascular anatomy. Multidetector CT imaging of the abdomen and pelvis was performed using the standard protocol during bolus administration of intravenous contrast. CONTRAST:  130m OMNIPAQUE IOHEXOL 350 MG/ML SOLN COMPARISON:  CT chest 07/04/2015.  CT abdomen and pelvis 01/14/2008. FINDINGS: CTA CHEST FINDINGS Dilated ascending thoracic aorta measuring 4.9 cm AP diameter, similar to prior study. No aortic dissection. Great vessel origins are patent. Descending thoracic aorta is patent. Normal heart size. Minimal pericardial effusion. Central pulmonary arteries are moderately well opacified without large pulmonary embolus. Contrast bolus timing limits evaluation of peripheral pulmonary arteries. Esophagus is decompressed. No significant lymphadenopathy in the chest. Minimal atelectasis in the lung bases. No pleural effusions. No pneumothorax. Linear scarring in the right mid lung. CT ABDOMEN and PELVIS FINDINGS Examination is limited due patient's body habitus. The liver,  spleen, gallbladder, adrenal glands, kidneys, abdominal aorta, inferior vena cava, and retroperitoneal lymph nodes are unremarkable. Mild fatty infiltration of the pancreas. Mild infiltration of the peripancreatic fat extending up around the celiac axis. This is nonspecific but could indicate early acute pancreatitis. No pancreatic ductal dilatation or fluid collection. Stomach, small bowel, and colon are decompressed but contrast material is seen in the colon suggesting no evidence of small bowel obstruction. Laxity of the anterior abdominal wall. No free air or free fluid in the abdomen. Pelvis: The prostate gland is enlarged. Bladder wall is not thickened. No free or loculated pelvic fluid collections. No pelvic mass or lymphadenopathy. The appendix is normal. Degenerative changes throughout the thoracic and lumbar spine. No destructive bone lesions. Review of the MIP images confirms the above findings. IMPRESSION: Dilated ascending thoracic aorta measuring 4.9 cm AP diameter without change since prior study. No dissection. No large central pulmonary emboli are visualized although contrast bolus limits evaluation of smaller peripheral pulmonary arteries. Minimal pericardial effusion. No evidence of active pulmonary disease. Mild infiltration in the peripancreatic fat which could indicate early acute pancreatitis. Correlation with clinical examination and laboratory evaluation suggested. Enlarged prostate gland. Electronically Signed   By: WLucienne CapersM.D.   On: 01/04/2016 01:30   Ct Abdomen Pelvis W Contrast  01/04/2016  CLINICAL DATA:  Shortness of breath. History of hypertension and morbid obesity. EXAM: CT ANGIOGRAPHY CHEST CT ABDOMEN AND PELVIS WITH CONTRAST TECHNIQUE: Multidetector CT imaging of the chest was performed using the standard protocol during bolus administration of intravenous contrast. Multiplanar CT image reconstructions and MIPs were obtained to evaluate the vascular anatomy.  Multidetector CT imaging of the abdomen and pelvis was performed using the standard protocol during bolus administration of intravenous contrast. CONTRAST:  1051mOMNIPAQUE IOHEXOL 350 MG/ML SOLN COMPARISON:  CT chest 07/04/2015.  CT abdomen and pelvis 01/14/2008. FINDINGS: CTA CHEST FINDINGS Dilated ascending thoracic aorta measuring 4.9 cm AP diameter, similar  to prior study. No aortic dissection. Great vessel origins are patent. Descending thoracic aorta is patent. Normal heart size. Minimal pericardial effusion. Central pulmonary arteries are moderately well opacified without large pulmonary embolus. Contrast bolus timing limits evaluation of peripheral pulmonary arteries. Esophagus is decompressed. No significant lymphadenopathy in the chest. Minimal atelectasis in the lung bases. No pleural effusions. No pneumothorax. Linear scarring in the right mid lung. CT ABDOMEN and PELVIS FINDINGS Examination is limited due patient's body habitus. The liver, spleen, gallbladder, adrenal glands, kidneys, abdominal aorta, inferior vena cava, and retroperitoneal lymph nodes are unremarkable. Mild fatty infiltration of the pancreas. Mild infiltration of the peripancreatic fat extending up around the celiac axis. This is nonspecific but could indicate early acute pancreatitis. No pancreatic ductal dilatation or fluid collection. Stomach, small bowel, and colon are decompressed but contrast material is seen in the colon suggesting no evidence of small bowel obstruction. Laxity of the anterior abdominal wall. No free air or free fluid in the abdomen. Pelvis: The prostate gland is enlarged. Bladder wall is not thickened. No free or loculated pelvic fluid collections. No pelvic mass or lymphadenopathy. The appendix is normal. Degenerative changes throughout the thoracic and lumbar spine. No destructive bone lesions. Review of the MIP images confirms the above findings. IMPRESSION: Dilated ascending thoracic aorta measuring 4.9 cm  AP diameter without change since prior study. No dissection. No large central pulmonary emboli are visualized although contrast bolus limits evaluation of smaller peripheral pulmonary arteries. Minimal pericardial effusion. No evidence of active pulmonary disease. Mild infiltration in the peripancreatic fat which could indicate early acute pancreatitis. Correlation with clinical examination and laboratory evaluation suggested. Enlarged prostate gland. Electronically Signed   By: Lucienne Capers M.D.   On: 01/04/2016 01:30   Dg Chest Portable 1 View  01/03/2016  CLINICAL DATA:  66 year old male with shortness of breath today. EXAM: PORTABLE CHEST 1 VIEW COMPARISON:  A 13 2016 and prior exams FINDINGS: Cardiomegaly and pulmonary vascular congestion noted. Mild interstitial opacities may represent mild interstitial edema. Bibasilar atelectasis is noted. No large pleural effusions or pneumothorax noted. IMPRESSION: Cardiomegaly with pulmonary vascular congestion and possible mild interstitial edema. Electronically Signed   By: Margarette Canada M.D.   On: 01/03/2016 12:08      Assessment/Plan Chronically draining abdominal wound Leukocytosis 19.6 Sepsis of unknown origin S/p 2 I&D 2009 Dr. Lucia Gaskins Hernia repair with mesh around 1999 Dr. Marylene Buerger  Plan: 1.  I opened the pinhole to about 1 cm wide.  I place 1/2 inch gauze packing into the wound so that it could adequately drain.  I only expressed dark bloody drainage, no purulence and no foul smell.  I did note the brown clear drainage on his dressing, but that was not currently coming out.  No need for surgical interventions at this time. 2.  Would change wick to wound once a day to keep it open and draining. 3.  I would be concerned that there is another source of sepsis.  It seems he has improved significantly since arrival, but may be worth getting a panorex to check his teeth as he has very poor dentition and fractured teeth.  May need dental consult  if it appears he has a teeth source.    Nat Christen, [facility] Surgery 01/04/2016, 12:54 PM Pager: [phone]

## 2016-01-04 NOTE — Progress Notes (Signed)
Patient is currently on 4LNC with sats of 94% and is in no distress at this time. BIPAP is not needed at this time. Will monitor as needed.

## 2016-01-05 DIAGNOSIS — I4891 Unspecified atrial fibrillation: Secondary | ICD-10-CM

## 2016-01-05 LAB — COMPREHENSIVE METABOLIC PANEL
ALT: 15 U/L — AB (ref 17–63)
AST: 24 U/L (ref 15–41)
Albumin: 3 g/dL — ABNORMAL LOW (ref 3.5–5.0)
Alkaline Phosphatase: 43 U/L (ref 38–126)
Anion gap: 9 (ref 5–15)
BUN: 18 mg/dL (ref 6–20)
CHLORIDE: 104 mmol/L (ref 101–111)
CO2: 27 mmol/L (ref 22–32)
CREATININE: 1.1 mg/dL (ref 0.61–1.24)
Calcium: 8.5 mg/dL — ABNORMAL LOW (ref 8.9–10.3)
GFR calc non Af Amer: >60 mL/min (ref >60)
Glucose, Bld: 94 mg/dL (ref 65–99)
POTASSIUM: 3.9 mmol/L (ref 3.5–5.1)
SODIUM: 140 mmol/L (ref 135–145)
Total Bilirubin: 1.2 mg/dL (ref 0.3–1.2)
Total Protein: 6.6 g/dL (ref 6.5–8.1)

## 2016-01-05 LAB — CBC WITH DIFFERENTIAL/PLATELET
Basophils Absolute: 0 10*3/uL (ref 0.0–0.1)
Basophils Relative: 0 %
EOS ABS: 0.1 10*3/uL (ref 0.0–0.7)
Eosinophils Relative: 0 %
HEMATOCRIT: 42.3 % (ref 39.0–52.0)
HEMOGLOBIN: 13.1 g/dL (ref 13.0–17.0)
LYMPHS ABS: 0.7 10*3/uL (ref 0.7–4.0)
LYMPHS PCT: 7 %
MCH: 26.1 pg (ref 26.0–34.0)
MCHC: 31 g/dL (ref 30.0–36.0)
MCV: 84.3 fL (ref 78.0–100.0)
MONOS PCT: 7 %
Monocytes Absolute: 0.7 10*3/uL (ref 0.1–1.0)
NEUTROS PCT: 86 %
Neutro Abs: 9.8 10*3/uL — ABNORMAL HIGH (ref 1.7–7.7)
Platelets: 136 10*3/uL — ABNORMAL LOW (ref 150–400)
RBC: 5.02 MIL/uL (ref 4.22–5.81)
RDW: 15 % (ref 11.5–15.5)
WBC: 11.3 10*3/uL — ABNORMAL HIGH (ref 4.0–10.5)

## 2016-01-05 LAB — MAGNESIUM: MAGNESIUM: 2.1 mg/dL (ref 1.7–2.4)

## 2016-01-05 MED ORDER — AMIODARONE HCL IN DEXTROSE 360-4.14 MG/200ML-% IV SOLN: 60.0000 mg/h | INTRAVENOUS | Status: DC

## 2016-01-05 MED ORDER — AMIODARONE LOAD VIA INFUSION: 150.0000 mg | Freq: Once | INTRAVENOUS | Status: DC

## 2016-01-05 MED ORDER — AMIODARONE HCL IN DEXTROSE 360-4.14 MG/200ML-% IV SOLN: 30.0000 mg/h | INTRAVENOUS | Status: DC

## 2016-01-05 MED ORDER — DILTIAZEM LOAD VIA INFUSION
10.0000 mg | Freq: Once | INTRAVENOUS | Status: AC
  Administered 2016-01-05: 10 mg via INTRAVENOUS
  Filled 2016-01-05: qty 10

## 2016-01-05 MED ORDER — CEFUROXIME AXETIL 250 MG PO TABS
500.0000 mg | ORAL_TABLET | Freq: Two times a day (BID) | ORAL | Status: DC
  Administered 2016-01-06 (×2): 500 mg via ORAL
  Filled 2016-01-05 (×2): qty 2

## 2016-01-05 MED ORDER — DILTIAZEM HCL ER COATED BEADS 300 MG PO CP24
300.0000 mg | ORAL_CAPSULE | Freq: Every day | ORAL | Status: DC
  Administered 2016-01-06: 300 mg via ORAL
  Filled 2016-01-05: qty 1

## 2016-01-05 MED ORDER — DILTIAZEM HCL 100 MG IV SOLR
5.0000 mg/h | INTRAVENOUS | Status: AC
  Administered 2016-01-05 – 2016-01-06 (×2): 10 mg/h via INTRAVENOUS
  Filled 2016-01-05 (×2): qty 100

## 2016-01-05 NOTE — Discharge Instructions (Addendum)
Wound Infection  A wound infection happens when a type of germ (bacteria) starts growing in the wound. In some cases, this can cause the wound to break open. If cared for properly, the infected wound will heal from the inside to the outside. Wound infections need treatment. CAUSES An infection is caused by bacteria growing in the wound.  SYMPTOMS   Increase in redness, swelling, or pain at the wound site.  Increase in drainage at the wound site.  Wound or bandage (dressing) starts to smell bad.  Fever.  Feeling tired or fatigued.  Pus draining from the wound. TREATMENT  Your health care provider will prescribe antibiotic medicine. The wound infection should improve within 24 to 48 hours. Any redness around the wound should stop spreading and the wound should be less painful.  HOME CARE INSTRUCTIONS   Only take over-the-counter or prescription medicines for pain, discomfort, or fever as directed by your health care provider.  Take your antibiotics as directed. Finish them even if you start to feel better.  Gently wash the area with mild soap and water 2 times a day, or as directed. Rinse off the soap. Pat the area dry with a clean towel. Do not rub the wound. This may cause bleeding.  Follow your health care provider's instructions for how often you need to change the dressing.  Apply ointment and a dressing to the wound as directed.  If the dressing sticks, moisten it with soapy water and gently remove it.  Change the bandage right away if it becomes wet, dirty, or develops a bad smell.  Take showers. Do not take tub baths, swim, or do anything that may soak the wound until it is healed.  Avoid exercises that make you sweat heavily.  Use anti-itch medicine as directed by your health care provider. The wound may itch when it is healing. Do not pick or scratch at the wound.  Follow up with your health care provider to get your wound rechecked as directed. SEEK MEDICAL CARE  IF:  You have an increase in swelling, pain, or redness around the wound.  You have an increase in the amount of pus coming from the wound.  There is a bad smell coming from the wound.  More of the wound breaks open.  You have a fever. MAKE SURE YOU:   Understand these instructions.  Will watch your condition.  Will get help right away if you are not doing well or get worse.   This information is not intended to replace advice given to you by your health care provider. Make sure you discuss any questions you have with your health care provider.   Document Released: 08/06/2003 Document Revised: 11/12/2013 Document Reviewed: 04/27/2015 Elsevier Interactive Patient Education 2016 [employer].     Information on my medicine - XARELTO (Rivaroxaban)  Why was Xarelto prescribed for you? Xarelto was prescribed for you to reduce the risk of a blood clot forming that can cause a stroke if you have a medical condition called atrial fibrillation (a type of irregular heartbeat).  What do you need to know about xarelto ? Take your Xarelto ONCE DAILY at the same time every day with your evening meal. If you have difficulty swallowing the tablet whole, you may crush it and mix in applesauce just prior to taking your dose.  Take Xarelto exactly as prescribed by your doctor and DO NOT stop taking Xarelto without talking to the doctor who prescribed the medication.  Stopping without other  stroke prevention medication to take the place of Xarelto may increase your risk of developing a clot that causes a stroke.  Refill your prescription before you run out.  After discharge, you should have regular check-up appointments with your healthcare provider that is prescribing your Xarelto.  In the future your dose may need to be changed if your kidney function or weight changes by a significant amount.  What do you do if you miss a dose? If you are taking Xarelto ONCE DAILY and you miss a  dose, take it as soon as you remember on the same day then continue your regularly scheduled once daily regimen the next day. Do not take two doses of Xarelto at the same time or on the same day.   Important Safety Information A possible side effect of Xarelto is bleeding. You should call your healthcare provider right away if you experience any of the following: ? Bleeding from an injury or your nose that does not stop. ? Unusual colored urine (red or dark brown) or unusual colored stools (red or black). ? Unusual bruising for unknown reasons. ? A serious fall or if you hit your head (even if there is no bleeding).  Some medicines may interact with Xarelto and might increase your risk of bleeding while on Xarelto. To help avoid this, consult your healthcare provider or pharmacist prior to using any new prescription or non-prescription medications, including herbals, vitamins, non-steroidal anti-inflammatory drugs (NSAIDs) and supplements.  This website has more information on Xarelto: VisitDestination.com.br.

## 2016-01-05 NOTE — Progress Notes (Signed)
01/05/2016 11:32 AM  Pt transferred from 75M to 6E10 via wheelchair accompanied by his family.  Pt fully alert and oriented, anxious but pleasant.  Currently on 3L of oxygen, vitals stable.  Patient placed on telemetry box 10, verified with CCMD and second RN.  Full assessment to flowsheets, skin intact. Boil noted to right groin, BLE discolored and dry but intact.  Pt denies pain at this time, but states that he does have pain in his teeth from time to time that he describes as "tolerable."  Pt one person assist, moderate fall risk per protocol.  Pt oriented to room/unit, and was instructed on how to utilize the callbell, to which he verbalized understanding.  Also educated patient on fall risk score and necessary interventions and he verbalized understanding.  Family remains at bedside.  Will continue to monitor patient. Theadora Rama

## 2016-01-05 NOTE — Progress Notes (Signed)
01/05/2016 12:52 PM  Pt assigned to [facility].  Report called to Seward Grater, RN.  Pt escorted downstairs with all belongings.  Theadora Rama

## 2016-01-05 NOTE — Progress Notes (Addendum)
01/05/2016 11:43 AM  Received phone call from CCMD stating that patient sustaining in the 150s.  Prior to this, patient had been anywhere from 110s-150s since transfer to floor.  Pt was dyspneic on exertion when transferring to chair, however, breathing has slowed and he denies chest pain or SOB at this time.  Dr. Joseph Art notified, awaiting call back.  Rapid response RN also notified.  Will await further orders.  Theadora Rama  Received return phone call from Dr. Joseph Art.  Updated MD on patient status (pt now sustaining in the 160s but totally asymptomatic).  Orders received to transfer to stepdown and start cardizem drip.  Rapid response updated as well. Currently waiting on a bed.  Will continue to monitor.  Theadora Rama

## 2016-01-05 NOTE — Progress Notes (Signed)
Subjective: Pt without complaints  Objective: Vital signs in last 24 hours: Temp:  [97 F (36.1 C)-98.3 F (36.8 C)] 98.2 F (36.8 C) (02/14 0341) Pulse Rate:  [48-137] 137 (02/14 0005) Resp:  [15-31] 31 (02/14 0005) BP: (95-161)/(62-92) 108/63 mmHg (02/14 0005) SpO2:  [93 %-99 %] 97 % (02/14 0005) Last BM Date: 01/04/16  Intake/Output from previous day: 02/13 0701 - 02/14 0700 In: 1190 [P.O.:1080; IV Piggyback:100] Out: 800 [Urine:800] Intake/Output this shift:    Incision/Wound:packing removed from 1 cm opening Small amount of pus expressed  Minimal erythema   Lab Results:   Recent Labs  01/03/16 1600 01/04/16 0229  WBC 15.6* 19.6*  HGB 12.8* 12.3*  HCT 40.7 39.1  PLT 144* 152   BMET  Recent Labs  01/03/16 1147 01/04/16 0821  NA 141 140  K 3.8 5.3*  CL 106 108  CO2 25 19*  GLUCOSE 106* 100*  BUN 18 19  CREATININE 1.19 1.08  CALCIUM 8.4* 8.2*   PT/INR  Recent Labs  01/03/16 1559  LABPROT 28.8*  INR 2.77*   ABG  Recent Labs  01/03/16 1144 01/04/16 0252  PHART 7.309* 7.320*  HCO3 22.7 24.4*    Studies/Results: Ct Angio Chest Pe W/cm &/or Wo Cm  01/04/2016  CLINICAL DATA:  Shortness of breath. History of hypertension and morbid obesity. EXAM: CT ANGIOGRAPHY CHEST CT ABDOMEN AND PELVIS WITH CONTRAST TECHNIQUE: Multidetector CT imaging of the chest was performed using the standard protocol during bolus administration of intravenous contrast. Multiplanar CT image reconstructions and MIPs were obtained to evaluate the vascular anatomy. Multidetector CT imaging of the abdomen and pelvis was performed using the standard protocol during bolus administration of intravenous contrast. CONTRAST:  OMNIPAQUE IOHEXOL 350 MG/ML SOLN COMPARISON:  CT chest 07/04/2015.  CT abdomen and pelvis 01/14/2008. FINDINGS: CTA CHEST FINDINGS Dilated ascending thoracic aorta measuring 4.9 cm AP diameter, similar to prior study. No aortic dissection. Great vessel  origins are patent. Descending thoracic aorta is patent. Normal heart size. Minimal pericardial effusion. Central pulmonary arteries are moderately well opacified without large pulmonary embolus. Contrast bolus timing limits evaluation of peripheral pulmonary arteries. Esophagus is decompressed. No significant lymphadenopathy in the chest. Minimal atelectasis in the lung bases. No pleural effusions. No pneumothorax. Linear scarring in the right mid lung. CT ABDOMEN and PELVIS FINDINGS Examination is limited due patient's body habitus. The liver, spleen, gallbladder, adrenal glands, kidneys, abdominal aorta, inferior vena cava, and retroperitoneal lymph nodes are unremarkable. Mild fatty infiltration of the pancreas. Mild infiltration of the peripancreatic fat extending up around the celiac axis. This is nonspecific but could indicate early acute pancreatitis. No pancreatic ductal dilatation or fluid collection. Stomach, small bowel, and colon are decompressed but contrast material is seen in the colon suggesting no evidence of small bowel obstruction. Laxity of the anterior abdominal wall. No free air or free fluid in the abdomen. Pelvis: The prostate gland is enlarged. Bladder wall is not thickened. No free or loculated pelvic fluid collections. No pelvic mass or lymphadenopathy. The appendix is normal. Degenerative changes throughout the thoracic and lumbar spine. No destructive bone lesions. Review of the MIP images confirms the above findings. IMPRESSION: Dilated ascending thoracic aorta measuring 4.9 cm AP diameter without change since prior study. No dissection. No large central pulmonary emboli are visualized although contrast bolus limits evaluation of smaller peripheral pulmonary arteries. Minimal pericardial effusion. No evidence of active pulmonary disease. Mild infiltration in the peripancreatic fat which could indicate early acute pancreatitis. Correlation  with clinical examination and laboratory  evaluation suggested. Enlarged prostate gland. Electronically Signed   By: Burman Nieves M.D.   On: 01/04/2016 01:30   Ct Abdomen Pelvis W Contrast  01/04/2016  CLINICAL DATA:  Shortness of breath. History of hypertension and morbid obesity. EXAM: CT ANGIOGRAPHY CHEST CT ABDOMEN AND PELVIS WITH CONTRAST TECHNIQUE: Multidetector CT imaging of the chest was performed using the standard protocol during bolus administration of intravenous contrast. Multiplanar CT image reconstructions and MIPs were obtained to evaluate the vascular anatomy. Multidetector CT imaging of the abdomen and pelvis was performed using the standard protocol during bolus administration of intravenous contrast. CONTRAST:  OMNIPAQUE IOHEXOL 350 MG/ML SOLN COMPARISON:  CT chest 07/04/2015.  CT abdomen and pelvis 01/14/2008. FINDINGS: CTA CHEST FINDINGS Dilated ascending thoracic aorta measuring 4.9 cm AP diameter, similar to prior study. No aortic dissection. Great vessel origins are patent. Descending thoracic aorta is patent. Normal heart size. Minimal pericardial effusion. Central pulmonary arteries are moderately well opacified without large pulmonary embolus. Contrast bolus timing limits evaluation of peripheral pulmonary arteries. Esophagus is decompressed. No significant lymphadenopathy in the chest. Minimal atelectasis in the lung bases. No pleural effusions. No pneumothorax. Linear scarring in the right mid lung. CT ABDOMEN and PELVIS FINDINGS Examination is limited due patient's body habitus. The liver, spleen, gallbladder, adrenal glands, kidneys, abdominal aorta, inferior vena cava, and retroperitoneal lymph nodes are unremarkable. Mild fatty infiltration of the pancreas. Mild infiltration of the peripancreatic fat extending up around the celiac axis. This is nonspecific but could indicate early acute pancreatitis. No pancreatic ductal dilatation or fluid collection. Stomach, small bowel, and colon are decompressed but  contrast material is seen in the colon suggesting no evidence of small bowel obstruction. Laxity of the anterior abdominal wall. No free air or free fluid in the abdomen. Pelvis: The prostate gland is enlarged. Bladder wall is not thickened. No free or loculated pelvic fluid collections. No pelvic mass or lymphadenopathy. The appendix is normal. Degenerative changes throughout the thoracic and lumbar spine. No destructive bone lesions. Review of the MIP images confirms the above findings. IMPRESSION: Dilated ascending thoracic aorta measuring 4.9 cm AP diameter without change since prior study. No dissection. No large central pulmonary emboli are visualized although contrast bolus limits evaluation of smaller peripheral pulmonary arteries. Minimal pericardial effusion. No evidence of active pulmonary disease. Mild infiltration in the peripancreatic fat which could indicate early acute pancreatitis. Correlation with clinical examination and laboratory evaluation suggested. Enlarged prostate gland. Electronically Signed   By: Burman Nieves M.D.   On: 01/04/2016 01:30   Dg Chest Portable 1 View  01/03/2016  CLINICAL DATA:  65 year old male with shortness of breath today. EXAM: PORTABLE CHEST 1 VIEW COMPARISON:  A 13 2016 and prior exams FINDINGS: Cardiomegaly and pulmonary vascular congestion noted. Mild interstitial opacities may represent mild interstitial edema. Bibasilar atelectasis is noted. No large pleural effusions or pneumothorax noted. IMPRESSION: Cardiomegaly with pulmonary vascular congestion and possible mild interstitial edema. Electronically Signed   By: Harmon Pier M.D.   On: 01/03/2016 12:08    Anti-infectives: Anti-infectives    Start     Dose/Rate Route Frequency Ordered Stop   01/04/16 1200  cefTRIAXone (ROCEPHIN) 2 g in dextrose 5 % 50 mL IVPB     2 g 100 mL/hr over 30 Minutes Intravenous Every 24 hours 01/03/16 1544 01/10/16 1159   01/04/16 1200  azithromycin (ZITHROMAX) 500 mg in  dextrose 5 % 250 mL IVPB  Status:  Discontinued  500 mg 250 mL/hr over 60 Minutes Intravenous Every 24 hours 01/03/16 1544 01/04/16 1059   01/03/16 1800  metroNIDAZOLE (FLAGYL) IVPB 500 mg  Status:  Discontinued     500 mg 100 mL/hr over 60 Minutes Intravenous 4 times per day 01/03/16 1547 01/04/16 1059   01/03/16 1145  cefTRIAXone (ROCEPHIN) 1 g in dextrose 5 % 50 mL IVPB     1 g 100 mL/hr over 30 Minutes Intravenous  Once 01/03/16 1136 01/03/16 1226   01/03/16 1145  azithromycin (ZITHROMAX) 500 mg in dextrose 5 % 250 mL IVPB     500 mg 250 mL/hr over 60 Minutes Intravenous  Once 01/03/16 1136 01/03/16 1303      Assessment/Plan: Chronic abdominal wound  Packing changed   Wound otherwise ok Daily packing changes continue antibiotics    LOS: 2 days    Tiarah Shisler A. 01/05/2016

## 2016-01-05 NOTE — Progress Notes (Signed)
Patient is on 3L [place] with O2 sat of 93%. Patient is in no distress at this time. BIPAP is not needed at this time. RT will monitor as needed.

## 2016-01-05 NOTE — Progress Notes (Signed)
[place] TEAM 1 - Stepdown/ICU TEAM Progress Note  Jeremy Rush:[id number] DOB: 10-Mar-1951 DOA: 01/03/2016 PCP: No PCP Per Patient  Admit HPI / Brief Narrative: 65 yo WM PMHx Chronic A-Fib on Xarelto, Morbid obesity, HTN, DJD (degenerative joint disease),     Developed fever(102.9 rectal) shortness of breath, tachycardia along with weakness. Seen at [facility] ED and treated with IVF and Abx with remarkable recovery. Transferred to [facility] ICU and placed on sepsis protocol.   HPI/Subjective: 2/14 A/O 4, NAD. Initially patient tentatively AMA but changed his mind prior to being fully discharged. Per patient no witnessed apneic events, no evidence of waking up short of breath, and no daytime fatigue.  Assessment/Plan: OSA?/Obesity Hypoventilation Syndrome? -400 lbs  -No evidence of above syndromes  Afib with RVR -TSH WNL -Cardizem gtt. DC 0800 -0800 start Cardizem LA 300 mg daily which is patient's home dose -Continue Xarelto 20 mg daily  Presumed sepsis with elevated lactic acid, hypotension/Chronic Open Abdominal Wound  -Most likely secondary to patient's acute on chronic abdominal abscess, per patient requires frequent I&D. -CT abdo /pelvis with no signs of fistula or abscess -Continue dressing change per surgery     Code Status: FULL Family Communication: no family present at time of exam Disposition Plan: DC in next 24-48 are    Consultants: [facility] M  Procedure/Significant Events: 2/12 [facility] ER< shock, sepsis, 102.9 F, bipap distress 2/12 CT abdomen pelvis; -Dilated ascending thoracic aorta measuring 4.9 cm AP diameter without change since prior study. No dissection.  CT abd/chest>> dilated ascending aorta (unchanged); minimal pericardial effusion. Possible early pancreatitis.  CT Angio >> no large central PE noticed    Culture 2/12 urine positive multiple species 2/12 blood right hand 2 NGTD 2/12 MRSA by PCR negative   Antibiotics: 2/12 ceftriaxone>>  2/14 2/12 azithromycin 1 dose 2/12 Flagyl >> 2/13 2/14 Ceftin>>  DVT prophylaxis: Xarelto   Devices    LINES / TUBES:      Continuous Infusions: . phenylephrine (NEO-SYNEPHRINE) Adult infusion      Objective: VITAL SIGNS: Temp: 98.6 F (37 C) (02/14 1157) Temp Source: Oral (02/14 1157) BP: 145/83 mmHg (02/14 1157) Pulse Rate: 109 (02/14 1157) SPO2; FIO2:   Intake/Output Summary (Last 24 hours) at 01/05/16 1211 Last data filed at 01/05/16 1123  Gross per 24 hour  Intake    600 ml  Output   1120 ml  Net   -520 ml     Exam: General:A/O 4, NAD. No acute respiratory distress Eyes: Negative headache, negative scleral hemorrhage ENT: Negative Runny nose, negative gingival bleeding, Neck:  Negative scars, masses, torticollis, lymphadenopathy, JVD Lungs: Clear to auscultation bilaterally without wheezes or crackles Cardiovascular: Regular rate and rhythm without murmur gallop or rub normal S1 and S2 Abdomen:negative abdominal pain, positive distention, small pin size incision with purulent drainage, positive soft, bowel sounds, no rebound, no ascites, no appreciable mass Extremities: No significant cyanosis, clubbing, or edema bilateral lower extremities Psychiatric:  Negative depression, negative anxiety, negative fatigue, negative mania Neurologic:  Cranial nerves II through XII intact, tongue/uvula midline, all extremities muscle strength 5/5, sensation intact throughout, negative dysarthria, negative expressive aphasia, negative receptive aphasia.   Data Reviewed: Basic Metabolic Panel:  Recent Labs Lab 01/03/16 1147 01/03/16 1600 01/04/16 0821  NA 141  --  140  K 3.8  --  5.3*  CL 106  --  108  CO2 25  --  19*  GLUCOSE 106*  --  100*  BUN 18  --  19  CREATININE 1.19  --  1.08  CALCIUM 8.4*  --  8.2*  MG  --  1.5* 1.9  PHOS  --  1.9* 3.5   Liver Function Tests:  Recent Labs Lab 01/03/16 1147  AST 22  ALT 12*  ALKPHOS 59  BILITOT 1.2  PROT  7.3  ALBUMIN 3.6    Recent Labs Lab 01/04/16 1143  LIPASE 18   No results for input(s): AMMONIA in the last 168 hours. CBC:  Recent Labs Lab 01/03/16 1147 01/03/16 1600 01/04/16 0229  WBC 5.7 15.6* 19.6*  NEUTROABS 5.3  --   --   HGB 14.1 12.8* 12.3*  HCT 44.8 40.7 39.1  MCV 84.2 83.4 84.4  PLT 148* 144* 152   Cardiac Enzymes:  Recent Labs Lab 01/03/16 1559  TROPONINI <0.03   BNP (last 3 results)  Recent Labs  07/04/15 0240 01/03/16 1147  BNP 123.0* 162.0*    ProBNP (last 3 results) No results for input(s): PROBNP in the last 8760 hours.  CBG:  Recent Labs Lab 01/03/16 1626 01/03/16 2017 01/04/16 0027 01/04/16 0720 01/04/16 1137  GLUCAP 89 113* 138* 99 99    Recent Results (from the past 240 hour(s))  Urine culture     Status: None   Collection Time: 01/03/16 11:27 AM  Result Value Ref Range Status   Specimen Description URINE, CLEAN CATCH  Final   Special Requests NONE  Final   Culture   Final    MULTIPLE SPECIES PRESENT, SUGGEST RECOLLECTION Performed at [facility]    Report Status 01/04/2016 FINAL  Final  Blood Culture (routine x 2)     Status: None (Preliminary result)   Collection Time: 01/03/16 11:35 AM  Result Value Ref Range Status   Specimen Description BLOOD RIGHT HAND  Final   Special Requests BOTTLES DRAWN AEROBIC AND ANAEROBIC 6CC EACH  Final   Culture NO GROWTH 2 DAYS  Final   Report Status PENDING  Incomplete  Blood Culture (routine x 2)     Status: None (Preliminary result)   Collection Time: 01/03/16 11:47 AM  Result Value Ref Range Status   Specimen Description BLOOD RIGHT HAND  Final   Special Requests BOTTLES DRAWN AEROBIC AND ANAEROBIC 8CC EACH  Final   Culture NO GROWTH 2 DAYS  Final   Report Status PENDING  Incomplete  MRSA PCR Screening     Status: None   Collection Time: 01/03/16  3:32 PM  Result Value Ref Range Status   MRSA by PCR NEGATIVE NEGATIVE Final    Comment:        The GeneXpert MRSA Assay  (FDA approved for NASAL specimens only), is one component of a comprehensive MRSA colonization surveillance program. It is not intended to diagnose MRSA infection nor to guide or monitor treatment for MRSA infections.      Studies:  Recent x-ray studies have been reviewed in detail by the Attending Physician  Scheduled Meds:  Scheduled Meds: . antiseptic oral rinse  7 mL Mouth Rinse BID  . cefTRIAXone (ROCEPHIN)  IV  2 g Intravenous Q24H  . loratadine  10 mg Oral Daily  . pantoprazole  40 mg Oral Q1200  . rivaroxaban  20 mg Oral Q supper    Time spent on care of this patient: 40 mins   Kenzy Campoverde, Roselind Messier , MD  [facility] Office  [phone] Pager [phone]  On-Call/Text Page:      [email or web address]      password [facility]  If 7PM-7AM, please contact night-coverage [email or web address] Password [email or web address] 01/05/2016, 12:11 PM   LOS: 2 days   Care during the described time interval was provided by me .  I have reviewed this patient's available data, including medical history, events of note, physical examination, and all test results as part of my evaluation. I have personally reviewed and interpreted all radiology studies.   Carolyne Littles, MD [phone] Pager

## 2016-01-06 DIAGNOSIS — S31109D Unspecified open wound of abdominal wall, unspecified quadrant without penetration into peritoneal cavity, subsequent encounter: Secondary | ICD-10-CM

## 2016-01-06 DIAGNOSIS — Z6841 Body Mass Index (BMI) 40.0 and over, adult: Secondary | ICD-10-CM

## 2016-01-06 LAB — CBC WITH DIFFERENTIAL/PLATELET
Basophils Absolute: 0 10*3/uL (ref 0.0–0.1)
Basophils Relative: 0 %
EOS ABS: 0.1 10*3/uL (ref 0.0–0.7)
Eosinophils Relative: 1 %
HEMATOCRIT: 41 % (ref 39.0–52.0)
HEMOGLOBIN: 13.1 g/dL (ref 13.0–17.0)
LYMPHS ABS: 0.8 10*3/uL (ref 0.7–4.0)
LYMPHS PCT: 8 %
MCH: 27.2 pg (ref 26.0–34.0)
MCHC: 32 g/dL (ref 30.0–36.0)
MCV: 85.1 fL (ref 78.0–100.0)
Monocytes Absolute: 0.8 10*3/uL (ref 0.1–1.0)
Monocytes Relative: 8 %
NEUTROS ABS: 8.7 10*3/uL — AB (ref 1.7–7.7)
NEUTROS PCT: 83 %
Platelets: 137 10*3/uL — ABNORMAL LOW (ref 150–400)
RBC: 4.82 MIL/uL (ref 4.22–5.81)
RDW: 15.1 % (ref 11.5–15.5)
WBC: 10.4 10*3/uL (ref 4.0–10.5)

## 2016-01-06 LAB — COMPREHENSIVE METABOLIC PANEL
ALK PHOS: 52 U/L (ref 38–126)
ALT: 13 U/L — AB (ref 17–63)
ANION GAP: 8 (ref 5–15)
AST: 23 U/L (ref 15–41)
Albumin: 2.9 g/dL — ABNORMAL LOW (ref 3.5–5.0)
BILIRUBIN TOTAL: 1.4 mg/dL — AB (ref 0.3–1.2)
BUN: 15 mg/dL (ref 6–20)
CALCIUM: 8.2 mg/dL — AB (ref 8.9–10.3)
CO2: 27 mmol/L (ref 22–32)
CREATININE: 1.1 mg/dL (ref 0.61–1.24)
Chloride: 103 mmol/L (ref 101–111)
Glucose, Bld: 94 mg/dL (ref 65–99)
Potassium: 4.2 mmol/L (ref 3.5–5.1)
Sodium: 138 mmol/L (ref 135–145)
TOTAL PROTEIN: 6.5 g/dL (ref 6.5–8.1)

## 2016-01-06 LAB — MAGNESIUM: Magnesium: 2 mg/dL (ref 1.7–2.4)

## 2016-01-06 MED ORDER — CEFUROXIME AXETIL 500 MG PO TABS: 500.0000 mg | ORAL_TABLET | Freq: Two times a day (BID) | ORAL | Status: DC

## 2016-01-06 NOTE — Care Management Note (Signed)
Case Management Note  Patient Details  Name: Jeremy Rush MRN: [id number] Date of Birth: 09-06-1951  Subjective/Objective:    Patient is for dc today, pta indep.  Patient will follow up with surgery for his chronic abd wound.                Action/Plan:   Expected Discharge Date:                  Expected Discharge Plan:  Home/Self Care  In-House Referral:     Discharge planning Services  CM Consult  Post Acute Care Choice:    Choice offered to:  Patient  DME Arranged:    DME Agency:     HH Arranged:    HH Agency:     Status of Service:  Completed, signed off  Medicare Important Message Given:    Date Medicare IM Given:    Medicare IM give by:    Date Additional Medicare IM Given:    Additional Medicare Important Message give by:     If discussed at Long Length of Stay Meetings, dates discussed:    Additional Comments:  Leone Haven, RN 01/06/2016, 6:06 PM

## 2016-01-06 NOTE — Progress Notes (Signed)
Discharge instructions given to patient and patients sister, all questions answered at this time.  Pt. Verbalized understanding of education.  Prescription given to patient.  Pt. VSS with no s/s of distress noted.  Patient stable at discharge.

## 2016-01-06 NOTE — Discharge Summary (Signed)
DISCHARGE SUMMARY  Jeremy Rush  MR#: [id number]  DOB:09/27/1951  Date of Admission: 01/03/2016 Date of Discharge: 01/06/2016  Attending Physician:Jeremy Rush  Patient's PCP:No PCP Per Patient  Consults:   PCCM  Gen Surgery   Disposition: D/C home at request of pt   Follow-up Appts: Follow-up Information    Follow up with [facility], MD. Schedule an appointment as soon as possible for a visit in 1 week.   Specialty:  General Surgery   Contact information:   [address] [place] [address] [phone]      Tests Needing Follow-up: -recheck of chronic abdom wound   Discharge Diagnoses: Sepsis with elevated lactic acid, hypotension due to acute infection of Chronic Open Abdominal Wound  Morbid Obesity - Body mass index is 60.89 kg/(m^2). Chronic Afib with transient acute RVR  Initial presentation: 65 yo M Hx Chronic A-Fib on Xarelto, Morbid obesity, HTN, and DJD who presented w/ c/o fever to 102.9, shortness of breath, tachycardia, and generalized weakness. He was seen at [facility] ED and transferred to [facility] ICU on the sepsis protocol.  Hospital Course:  Sepsis with elevated lactic acid, hypotension due to acute infection of Chronic Open Abdominal Wound  CT abd/pelvis with no signs of fistula or abscess - Gen Surgery directed wound care and procedure was not required - sepsis resolved w/ supportive care to include short term neo gtt, volume resuscitation, and empiric abx tx - pt rapidly stabilized - he desired d/c home 2/15 and was clinically stable - he was offered PT/OT evals to assist in arranging home care but he was not interested - he is to be d/c home at his request   Morbid Obesity - Body mass index is 60.89 kg/(m^2). -400 lbs - pt counseled on need for weight loss   Chronic Afib with transient acute RVR Home CCB dose was initially held in setting of sepsis/hypotension w/ need for pressors - once BP recovered and pressors were d/c the pt  developed RVR - this quickly resolved w/ short term use of IV cardizem, followed by rapid titration back to usual oral cardizem dose - Xarelto continued at time of d/c     Medication List    TAKE these medications        acetaminophen 500 MG tablet  Commonly known as:  TYLENOL  Take 500 mg by mouth every 6 (six) hours as needed for pain.     CARDIZEM LA 300 MG 24 hr tablet  Generic drug:  diltiazem  TAKE 1 TABLET (300 MG TOTAL) BY MOUTH DAILY.     cefUROXime 500 MG tablet  Commonly known as:  CEFTIN  Take 1 tablet (500 mg total) by mouth 2 (two) times daily with a meal.     furosemide 20 MG tablet  Commonly known as:  LASIX  Take 1 tablet (20 mg total) by mouth daily.     lisinopril 40 MG tablet  Commonly known as:  PRINIVIL,ZESTRIL  Take 1 tablet (40 mg total) by mouth daily.     loratadine 10 MG tablet  Commonly known as:  CLARITIN  TAKE 1 TABLET BY MOUTH DAILY     XARELTO 20 MG Tabs tablet  Generic drug:  rivaroxaban  TAKE 1 TABLET BY MOUTH EVERY DAY       Day of Discharge BP 131/91 mmHg  Pulse 104  Temp(Src) 98.3 F (36.8 C) (Oral)  Resp 17  Ht  (1.727 m)  Wt 181.6 kg (400 lb  5.7 oz)  BMI 60.89 kg/m2  SpO2 97%  Physical Exam: General: No acute respiratory distress Lungs: Clear to auscultation bilaterally without wheezes or crackles Cardiovascular: Regular rate without murmur gallop or rub normal S1 and S2 Abdomen: Nontender, morbidly obese, soft, bowel sounds positive - wound dressed and dry  Extremities: No significant cyanosis, or clubbing - chronic appearing 2+ B LE edema   Basic Metabolic Panel:  Recent Labs Lab 01/03/16 1147 01/03/16 1600 01/04/16 0821 01/05/16 1840 01/06/16 0540  NA 141  --  140 140 138  K 3.8  --  5.3* 3.9 4.2  CL 106  --  108 104 103  CO2 25  --  19* 27 27  GLUCOSE 106*  --  100* 94 94  BUN 18  --  19 18 15   CREATININE 1.19  --  1.08 1.10 1.10  CALCIUM 8.4*  --  8.2* 8.5* 8.2*  MG  --  1.5* 1.9 2.1 2.0  PHOS   --  1.9* 3.5  --   --     Liver Function Tests:  Recent Labs Lab 01/03/16 1147 01/05/16 1840 01/06/16 0540  AST 22 24 23   ALT 12* 15* 13*  ALKPHOS 59 43 52  BILITOT 1.2 1.2 1.4*  PROT 7.3 6.6 6.5  ALBUMIN 3.6 3.0* 2.9*    Recent Labs Lab 01/04/16 1143  LIPASE 18   Coags:  Recent Labs Lab 01/03/16 1559  INR 2.77*   CBC:  Recent Labs Lab 01/03/16 1147 01/03/16 1600 01/04/16 0229 01/05/16 1840 01/06/16 0540  WBC 5.7 15.6* 19.6* 11.3* 10.4  NEUTROABS 5.3  --   --  9.8* 8.7*  HGB 14.1 12.8* 12.3* 13.1 13.1  HCT 44.8 40.7 39.1 42.3 41.0  MCV 84.2 83.4 84.4 84.3 85.1  PLT 148* 144* 152 136* 137*    Cardiac Enzymes:  Recent Labs Lab 01/03/16 1559  TROPONINI <0.03   BNP (last 3 results)  Recent Labs  07/04/15 0240 01/03/16 1147  BNP 123.0* 162.0*    CBG:  Recent Labs Lab 01/03/16 1626 01/03/16 2017 01/04/16 0027 01/04/16 0720 01/04/16 1137  GLUCAP 89 113* 138* 99 99    Recent Results (from the past 240 hour(s))  Urine culture     Status: None   Collection Time: 01/03/16 11:27 AM  Result Value Ref Range Status   Specimen Description URINE, CLEAN CATCH  Final   Special Requests NONE  Final   Culture   Final    MULTIPLE SPECIES PRESENT, SUGGEST RECOLLECTION Performed at [facility]    Report Status 01/04/2016 FINAL  Final  Blood Culture (routine x 2)     Status: None (Preliminary result)   Collection Time: 01/03/16 11:35 AM  Result Value Ref Range Status   Specimen Description BLOOD RIGHT HAND  Final   Special Requests BOTTLES DRAWN AEROBIC AND ANAEROBIC 6CC EACH  Final   Culture NO GROWTH 3 DAYS  Final   Report Status PENDING  Incomplete  Blood Culture (routine x 2)     Status: None (Preliminary result)   Collection Time: 01/03/16 11:47 AM  Result Value Ref Range Status   Specimen Description BLOOD RIGHT HAND  Final   Special Requests BOTTLES DRAWN AEROBIC AND ANAEROBIC 8CC EACH  Final   Culture NO GROWTH 3 DAYS  Final    Report Status PENDING  Incomplete  MRSA PCR Screening     Status: None   Collection Time: 01/03/16  3:32 PM  Result Value Ref Range Status  MRSA by PCR NEGATIVE NEGATIVE Final    Comment:        The GeneXpert MRSA Assay (FDA approved for NASAL specimens only), is one component of a comprehensive MRSA colonization surveillance program. It is not intended to diagnose MRSA infection nor to guide or monitor treatment for MRSA infections.     Time spent in discharge (includes decision making & examination of pt): 35 minutes  01/06/2016, 4:28 PM   Lonia Blood, MD [facility] Office  [phone] Pager [phone]  On-Call/Text Page:      [email or web address]      password [facility]

## 2016-01-06 NOTE — Progress Notes (Signed)
  Subjective: No complaints Some greenish drainage noted   Objective: Vital signs in last 24 hours: Temp:  [97 F (36.1 C)-98.6 F (37 C)] 97.3 F (36.3 C) (02/15 0727) Pulse Rate:  [56-117] 87 (02/15 0400) Resp:  [17-29] 26 (02/15 0400) BP: (116-145)/(78-102) 122/84 mmHg (02/15 0400) SpO2:  [92 %-99 %] 97 % (02/15 0400) Weight:  [171.4 kg (377 lb 13.9 oz)-181.6 kg (400 lb 5.7 oz)] 181.6 kg (400 lb 5.7 oz) (02/15 0500) Last BM Date: 01/05/16  Intake/Output from previous day: 02/14 0701 - 02/15 0700 In: 947.2 [P.O.:480; I.V.:417.2; IV Piggyback:50] Out: 1620 [Urine:1620] Intake/Output this shift:    Incision/Wound:open greenish drainage noted no erythema   Lab Results:   Recent Labs  01/05/16 1840 01/06/16 0540  WBC 11.3* 10.4  HGB 13.1 13.1  HCT 42.3 41.0  PLT 136* 137*   BMET  Recent Labs  01/05/16 1840 01/06/16 0540  NA 140 138  K 3.9 4.2  CL 104 103  CO2 27 27  GLUCOSE 94 94  BUN 18 15  CREATININE 1.10 1.10  CALCIUM 8.5* 8.2*   PT/INR  Recent Labs  01/03/16 1559  LABPROT 28.8*  INR 2.77*   ABG  Recent Labs  01/03/16 1144 01/04/16 0252  PHART 7.309* 7.320*  HCO3 22.7 24.4*    Studies/Results: No results found.  Anti-infectives: Anti-infectives    Start     Dose/Rate Route Frequency Ordered Stop   01/06/16 0800  cefUROXime (CEFTIN) tablet 500 mg     500 mg Oral 2 times daily with meals 01/05/16 1832     01/04/16 1200  cefTRIAXone (ROCEPHIN) 2 g in dextrose 5 % 50 mL IVPB  Status:  Discontinued     2 g 100 mL/hr over 30 Minutes Intravenous Every 24 hours 01/03/16 1544 01/05/16 1853   01/04/16 1200  azithromycin (ZITHROMAX) 500 mg in dextrose 5 % 250 mL IVPB  Status:  Discontinued     500 mg 250 mL/hr over 60 Minutes Intravenous Every 24 hours 01/03/16 1544 01/04/16 1059   01/03/16 1800  metroNIDAZOLE (FLAGYL) IVPB 500 mg  Status:  Discontinued     500 mg 100 mL/hr over 60 Minutes Intravenous 4 times per day 01/03/16 1547 01/04/16  1059   01/03/16 1145  cefTRIAXone (ROCEPHIN) 1 g in dextrose 5 % 50 mL IVPB     1 g 100 mL/hr over 30 Minutes Intravenous  Once 01/03/16 1136 01/03/16 1226   01/03/16 1145  azithromycin (ZITHROMAX) 500 mg in dextrose 5 % 250 mL IVPB     500 mg 250 mL/hr over 60 Minutes Intravenous  Once 01/03/16 1136 01/03/16 1303      Assessment/Plan: Chronic abdominal wound  Open and well drained  Follow up with Dr Ezzard Standing in 1 - 2 weeks  Continue wound care     LOS: 3 days    Micki Cassel A. 01/06/2016

## 2016-01-08 LAB — CULTURE, BLOOD (ROUTINE X 2)
Culture: NO GROWTH
Culture: NO GROWTH

## 2016-02-03 ENCOUNTER — Telehealth: Payer: Self-pay | Admitting: Cardiovascular Disease

## 2016-02-03 NOTE — Telephone Encounter (Signed)
Pt c/o rt sided back pain that does not radiate. Onset 2 days ago. Pt denies blood in urine. States that he thinks it is coming from his blood thinners.  Pt encouraged to be seen by PCP. Pt states he does not have PCP but does [facility] and can not be seen for a month. Will route to DOD.

## 2016-02-03 NOTE — Telephone Encounter (Signed)
Hard to know what to make of his symptoms based on this description. If there is room on the APP schedule today he can be seen here. Otherwise would suggest that he go to an urgent care today to have this further evaluated. May need to have urinalysis or other studies depending on the situation.

## 2016-02-03 NOTE — Telephone Encounter (Signed)
Spoke with pt given instructions to be seen in urgent care, and of known urgent care sites b/c there is no open slots on the APP schedule today. Pt voiced understanding.

## 2016-02-03 NOTE — Telephone Encounter (Signed)
Pt is having pain in his kidneys and he thinks it is coming from his blood thinners

## 2016-02-04 ENCOUNTER — Ambulatory Visit: Payer: Self-pay | Admitting: Family Medicine

## 2016-02-05 ENCOUNTER — Encounter: Payer: Self-pay | Admitting: General Practice

## 2016-02-09 ENCOUNTER — Telehealth: Payer: Self-pay | Admitting: Cardiovascular Disease

## 2016-02-09 NOTE — Telephone Encounter (Signed)
Pt wants to decrease lasix to 10 mg because he urinates too much ,can't recall BP reading.I asked him to come by for BP check,will try to get a ride but still wants to ask if he can reduce his lasix

## 2016-02-09 NOTE — Telephone Encounter (Signed)
Patient has questions about his medication.

## 2016-02-09 NOTE — Telephone Encounter (Signed)
That would be fine 

## 2016-02-10 MED ORDER — FUROSEMIDE 20 MG PO TABS: 10.0000 mg | ORAL_TABLET | Freq: Every day | ORAL | Status: DC

## 2016-02-10 NOTE — Telephone Encounter (Signed)
Let pt know he could decrease his lasix dose to 10 mg daily. Sent in new rx.

## 2016-03-26 ENCOUNTER — Other Ambulatory Visit: Payer: Self-pay | Admitting: Cardiovascular Disease

## 2016-03-28 ENCOUNTER — Other Ambulatory Visit: Payer: Self-pay | Admitting: Cardiovascular Disease

## 2016-03-28 NOTE — Telephone Encounter (Signed)
Per wife pt has already left for pharmacy,states he will call us back

## 2016-03-28 NOTE — Telephone Encounter (Signed)
Patient has questions regarding his medication refills / tg

## 2016-04-01 IMAGING — CR DG CHEST 1V PORT
1 series · 1 of 1 positions shown · non-contrast
Comparison: One-view chest x-ray 11/10/2014.

CLINICAL DATA: Shortness of breath.

EXAM:
PORTABLE CHEST - 1 VIEW

[ap portable]
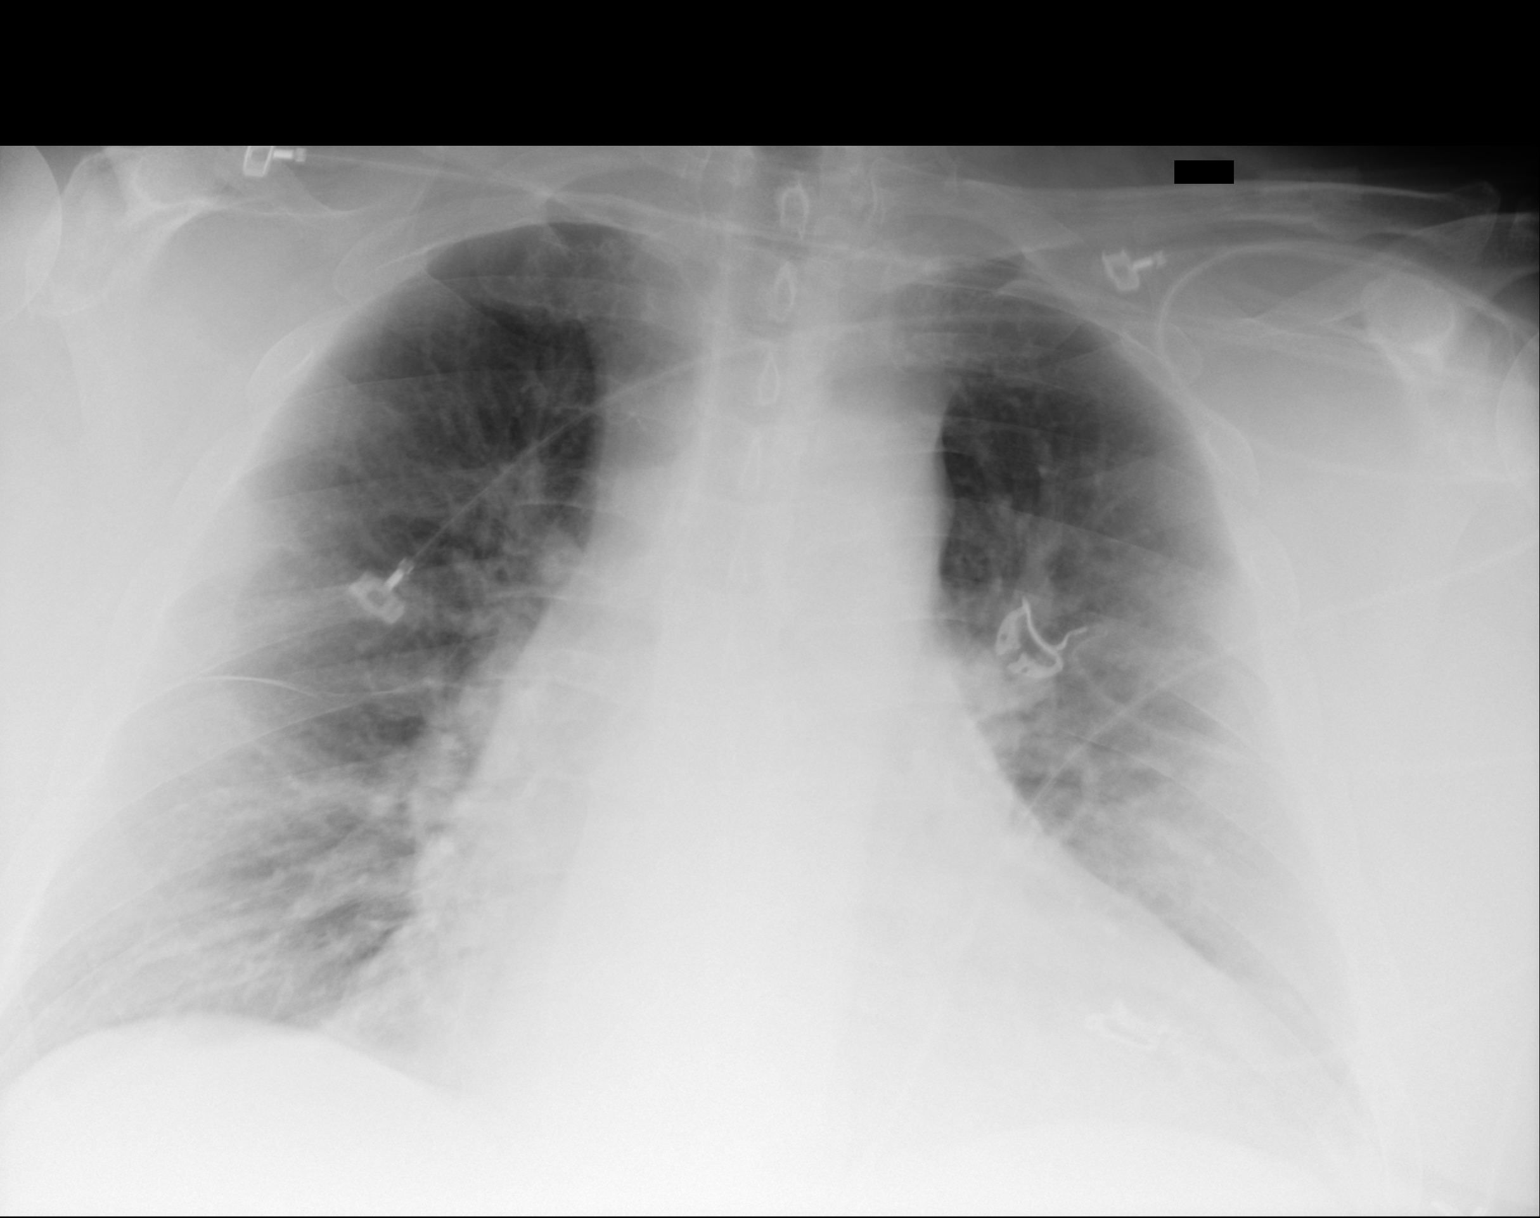

[1 of 1 positions shown; findings below may reference images not displayed]

FINDINGS: The heart is enlarged. Mild edema is present. No focal airspace
consolidation is present.
IMPRESSION: Cardiomegaly and mild edema.

## 2016-04-04 ENCOUNTER — Other Ambulatory Visit: Payer: Self-pay | Admitting: Adult Health

## 2016-07-04 ENCOUNTER — Telehealth: Payer: Self-pay | Admitting: Cardiovascular Disease

## 2016-07-04 MED ORDER — DILTIAZEM HCL ER COATED BEADS 300 MG PO TB24: ORAL_TABLET | ORAL | 6 refills | Status: DC

## 2016-07-04 NOTE — Telephone Encounter (Signed)
[place] requires generic, I called [facility] and gave order for diltiazem 300 mg ER to pharmacist,they will run generic request through

## 2016-07-04 NOTE — Telephone Encounter (Signed)
Needs refill on [facility]iaz[facility] [place]. / tg

## 2016-08-11 ENCOUNTER — Other Ambulatory Visit: Payer: Self-pay | Admitting: Cardiothoracic Surgery

## 2016-08-11 DIAGNOSIS — I712 Thoracic aortic aneurysm, without rupture, unspecified: Secondary | ICD-10-CM

## 2016-09-14 ENCOUNTER — Ambulatory Visit: Payer: Medicaid Other | Admitting: Cardiothoracic Surgery

## 2016-09-14 ENCOUNTER — Ambulatory Visit
Admission: RE | Admit: 2016-09-14 | Discharge: 2016-09-14 | Disposition: A | Discharge status: Home / Self Care | Payer: Medicaid Other | Source: Ambulatory Visit | Attending: Cardiothoracic Surgery | Admitting: Cardiothoracic Surgery

## 2016-09-14 DIAGNOSIS — I712 Thoracic aortic aneurysm, without rupture, unspecified: Secondary | ICD-10-CM

## 2016-09-20 ENCOUNTER — Telehealth: Payer: Self-pay | Admitting: Cardiovascular Disease

## 2016-09-20 NOTE — Telephone Encounter (Signed)
Pt had CT ordered by Dr Edmonia CaprioVan Tright,and had panic attack.he has placed call to him to see if he could go to an open air facility or possibly have sedation,as he has had CT's in the past

## 2016-09-20 NOTE — Telephone Encounter (Signed)
Patient could not finish test due to being scare of tight places. Would like to know what other test can be done instead of one he was suppose to do

## 2016-09-21 ENCOUNTER — Ambulatory Visit: Payer: Medicaid Other | Admitting: Cardiothoracic Surgery

## 2016-09-27 ENCOUNTER — Telehealth: Payer: Self-pay | Admitting: Cardiovascular Disease

## 2016-09-27 NOTE — Telephone Encounter (Signed)
Please give the pt a call-he's needing to have a cortisone shot, but would like to speak w/ someone first

## 2016-09-27 NOTE — Telephone Encounter (Signed)
Pt states he will discuss cortisone shot for the knee at next office visit.

## 2016-10-01 IMAGING — CT CT ANGIO CHEST
2 of 7 series · 17 of 36 positions shown · IV contrast (omnipaque)
Comparison: CT chest 07/04/2015.  CT abdomen and pelvis 01/14/2008.

CLINICAL DATA: Shortness of breath. History of hypertension and
morbid obesity.

EXAM:
CT ANGIOGRAPHY CHEST
CT ABDOMEN AND PELVIS WITH CONTRAST
TECHNIQUE: Multidetector CT imaging of the chest was performed using the
standard protocol during bolus administration of intravenous
contrast. Multiplanar CT image reconstructions and MIPs were
obtained to evaluate the vascular anatomy. Multidetector CT imaging
of the abdomen and pelvis was performed using the standard protocol
during bolus administration of intravenous contrast.
CONTRAST:  100mL OMNIPAQUE IOHEXOL 350 MG/ML SOLN

[Series 4: pe thins · axial · 0.90mm/px · z∈[-328,-53]mm · 16 of 435 slices shown]
[im 21/435  lung]
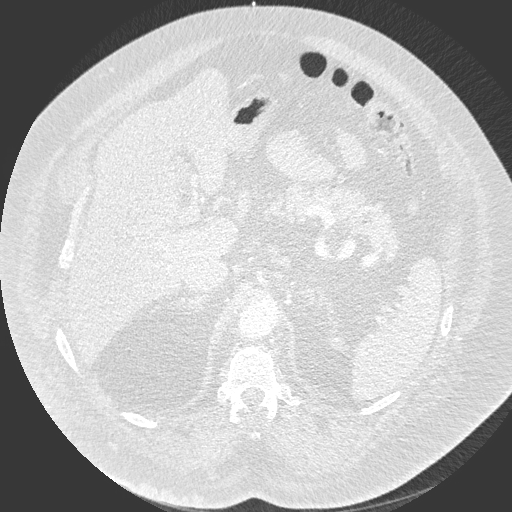
[im 42/435  mediastinal]
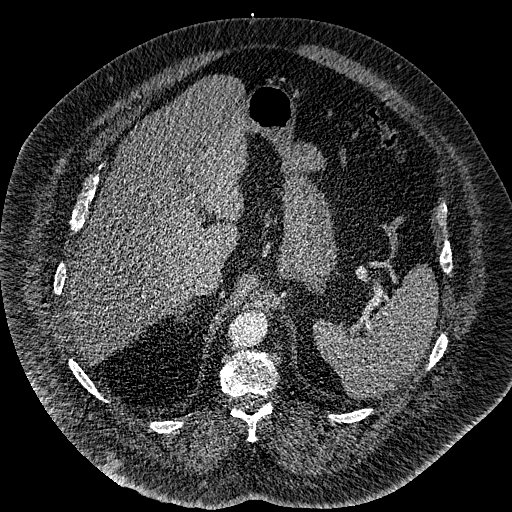
[im 83/435  lung]
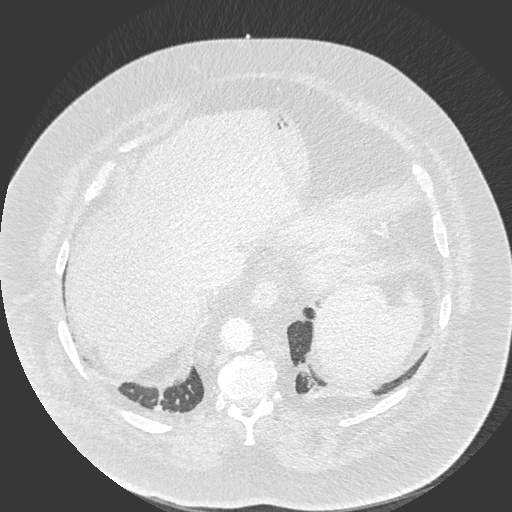
[im 104/435  mediastinal]
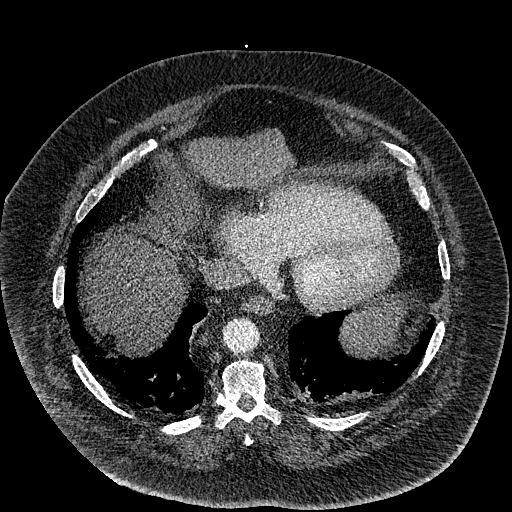
[im 125/435  lung]
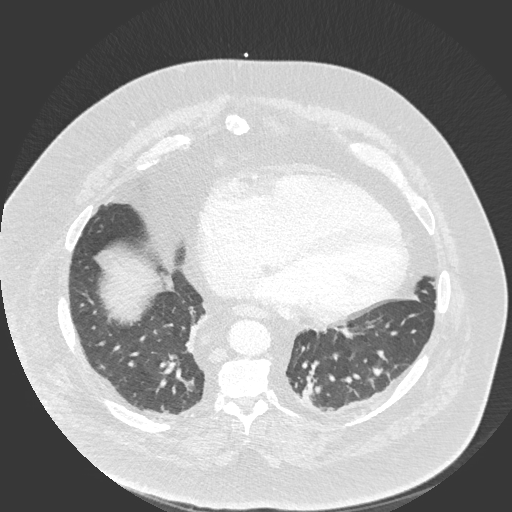
[im 145/435  mediastinal]
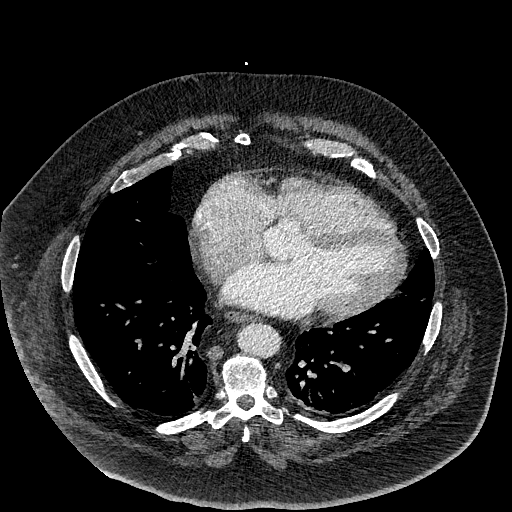
[im 187/435  lung]
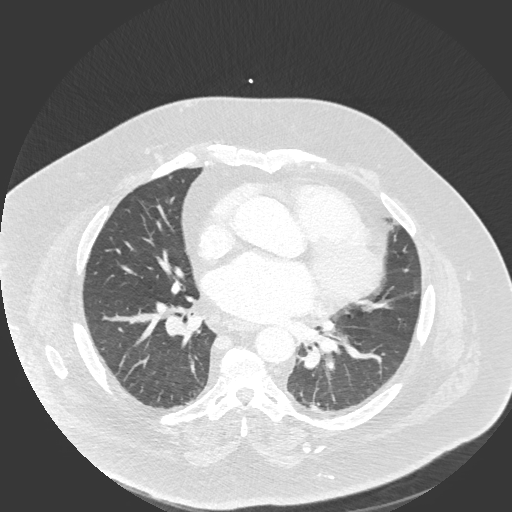
[im 207/435  mediastinal]
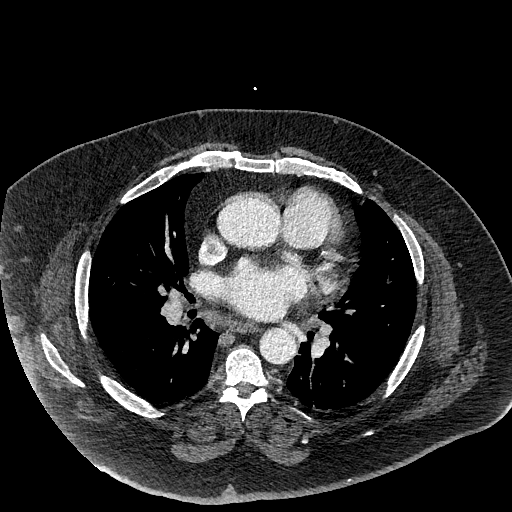
[im 228/435  lung]
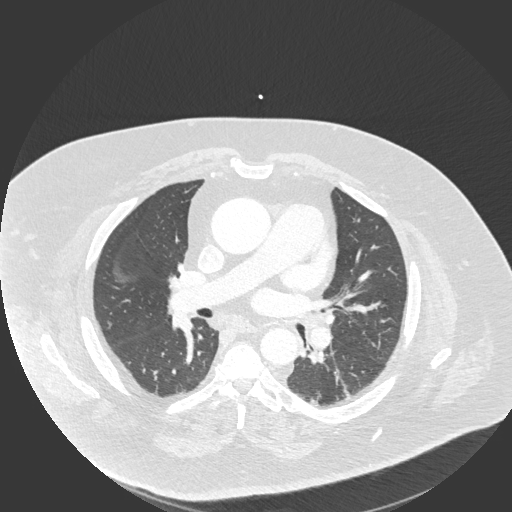
[im 249/435  mediastinal]
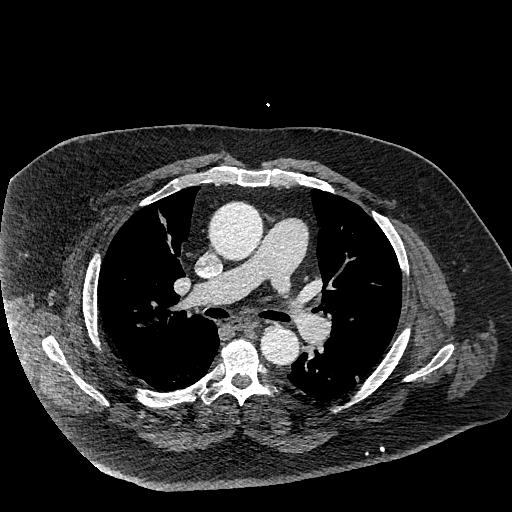
[im 290/435  lung]
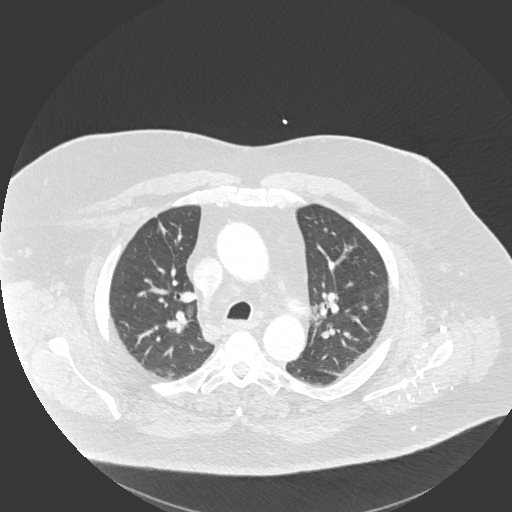
[im 311/435  mediastinal]
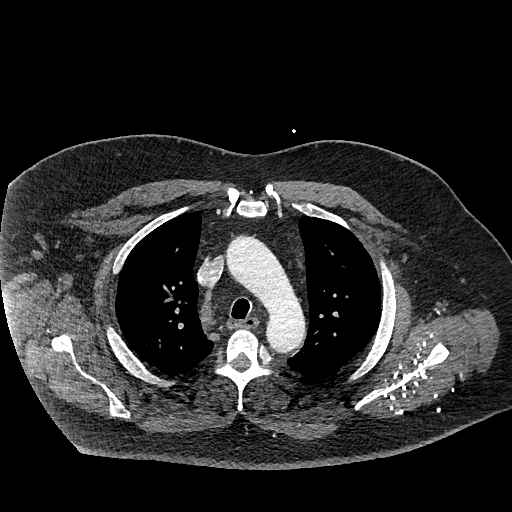
[im 331/435  lung]
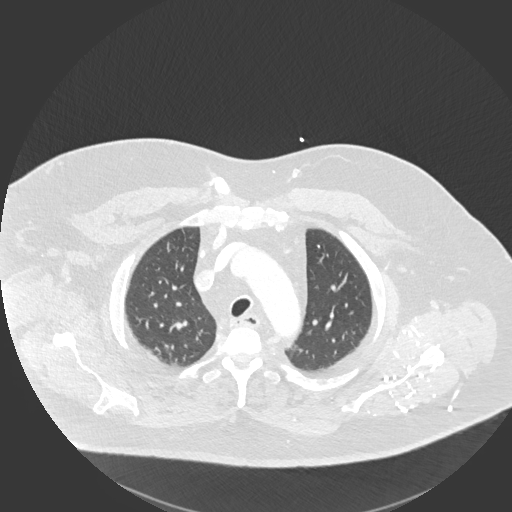
[im 352/435  mediastinal]
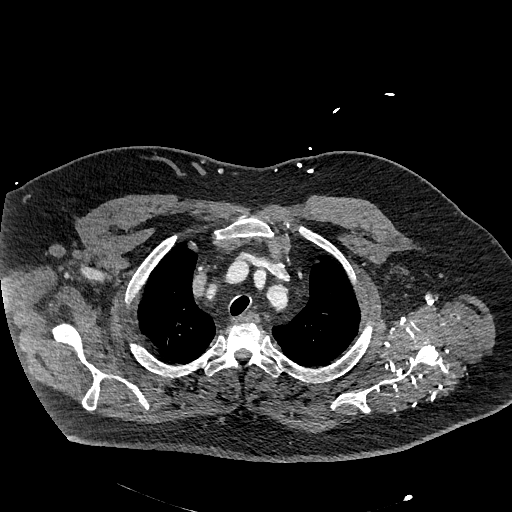
[im 393/435  lung]
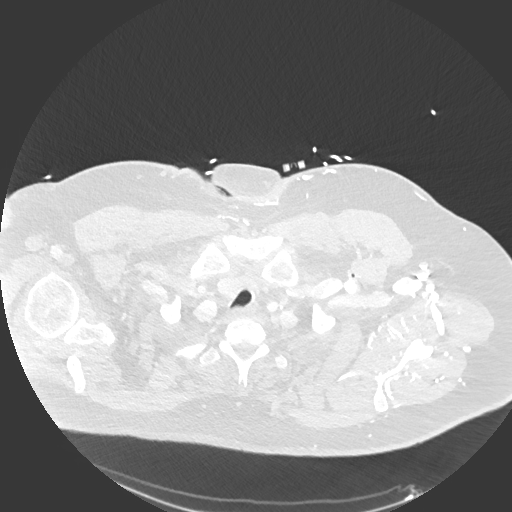
[im 414/435  mediastinal]
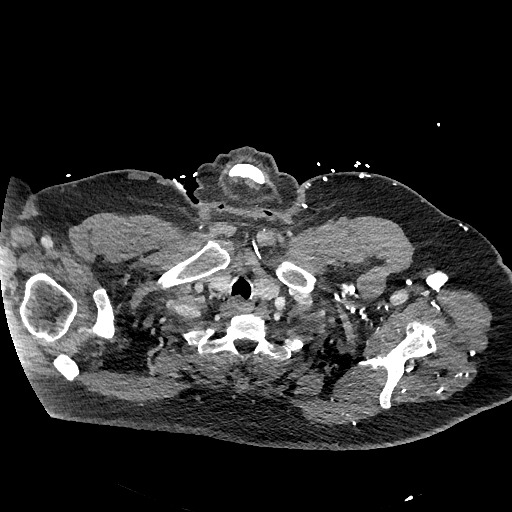

[Series 5: pe 2mm cor · coronal · 0.60mm/px · 1 of 110 slices shown]
[im 55/110  mediastinal]
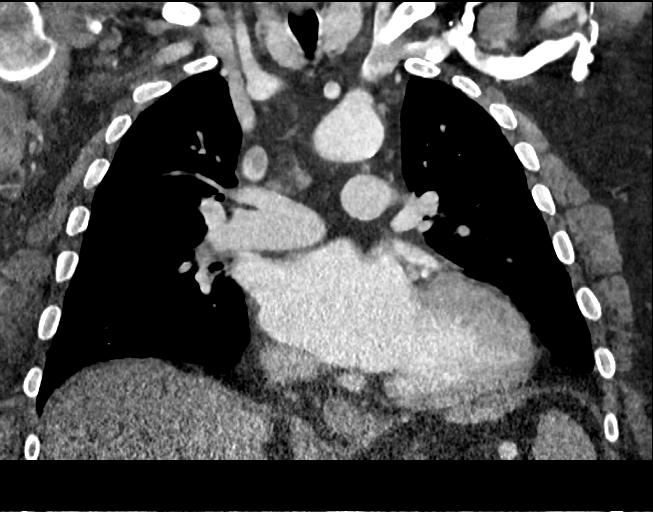

[17 of 36 positions shown; findings below may reference images not displayed]

FINDINGS: CTA CHEST FINDINGS

Dilated ascending thoracic aorta measuring 4.9 cm AP diameter,
similar to prior study. No aortic dissection. Great vessel origins
are patent. Descending thoracic aorta is patent.

Normal heart size. Minimal pericardial effusion. Central pulmonary
arteries are moderately well opacified without large pulmonary
embolus. Contrast bolus timing limits evaluation of peripheral
pulmonary arteries.

Esophagus is decompressed. No significant lymphadenopathy in the
chest.

Minimal atelectasis in the lung bases. No pleural effusions. No
pneumothorax. Linear scarring in the right mid lung.

CT ABDOMEN and PELVIS FINDINGS

Examination is limited due patient's body habitus. The liver,
spleen, gallbladder, adrenal glands, kidneys, abdominal aorta,
inferior vena cava, and retroperitoneal lymph nodes are
unremarkable. Mild fatty infiltration of the pancreas. Mild
infiltration of the peripancreatic fat extending up around the
celiac axis. This is nonspecific but could indicate early acute
pancreatitis. No pancreatic ductal dilatation or fluid collection.

Stomach, small bowel, and colon are decompressed but contrast
material is seen in the colon suggesting no evidence of small bowel
obstruction. Laxity of the anterior abdominal wall. No free air or
free fluid in the abdomen.

Pelvis: The prostate gland is enlarged. Bladder wall is not
thickened. No free or loculated pelvic fluid collections. No pelvic
mass or lymphadenopathy. The appendix is normal. Degenerative
changes throughout the thoracic and lumbar spine. No destructive
bone lesions.

Review of the MIP images confirms the above findings.
IMPRESSION: Dilated ascending thoracic aorta measuring 4.9 cm AP diameter
without change since prior study. No dissection. No large central
pulmonary emboli are visualized although contrast bolus limits
evaluation of smaller peripheral pulmonary arteries. Minimal
pericardial effusion. No evidence of active pulmonary disease.

Mild infiltration in the peripancreatic fat which could indicate
early acute pancreatitis. Correlation with clinical examination and
laboratory evaluation suggested. Enlarged prostate gland.

## 2016-10-10 ENCOUNTER — Encounter: Payer: Self-pay | Admitting: Cardiovascular Disease

## 2016-10-10 ENCOUNTER — Ambulatory Visit (INDEPENDENT_AMBULATORY_CARE_PROVIDER_SITE_OTHER): Payer: Medicaid Other | Admitting: Cardiovascular Disease

## 2016-10-10 VITALS — BP 158/98 | HR 93 | Ht 68.0 in | Wt 397.0 lb

## 2016-10-10 DIAGNOSIS — I712 Thoracic aortic aneurysm, without rupture, unspecified: Secondary | ICD-10-CM

## 2016-10-10 DIAGNOSIS — I482 Chronic atrial fibrillation, unspecified: Secondary | ICD-10-CM

## 2016-10-10 DIAGNOSIS — R600 Localized edema: Secondary | ICD-10-CM

## 2016-10-10 DIAGNOSIS — I10 Essential (primary) hypertension: Secondary | ICD-10-CM

## 2016-10-10 MED ORDER — CARVEDILOL 3.125 MG PO TABS: 3.1250 mg | ORAL_TABLET | Freq: Two times a day (BID) | ORAL | 3 refills | Status: DC

## 2016-10-10 NOTE — Progress Notes (Signed)
SUBJECTIVE: The patient presents for follow-up for chronic atrial fibrillation, essential hypertension, and an ascending aortic aneurysm.  Denies chest pain and palpitations. Wants to get injection for right shoulder pain.  Said he was unable to get chest CT due to "cold sweats".   Review of Systems: As per "subjective", otherwise negative.  Allergies  Allergen Reactions  . Codeine Other (See Comments)    Mood swing  . Morphine And Related Other (See Comments)    Mood swing    Current Outpatient Prescriptions  Medication Sig Dispense Refill  . acetaminophen (TYLENOL) 500 MG tablet Take 500 mg by mouth every 6 (six) hours as needed for pain.    Marland Kitchen. diltiazem (CARDIZEM LA) 300 MG 24 hr tablet TAKE 1 TABLET (300 MG TOTAL) BY MOUTH DAILY. 30 tablet 6  . furosemide (LASIX) 20 MG tablet Take 0.5 tablets (10 mg total) by mouth daily. 90 tablet 3  . lisinopril (PRINIVIL,ZESTRIL) 40 MG tablet Take 1 tablet (40 mg total) by mouth daily. 90 tablet 3  . loratadine (CLARITIN) 10 MG tablet TAKE 1 TABLET BY MOUTH DAILY 30 tablet 3  . XARELTO 20 MG TABS tablet TAKE 1 TABLET BY MOUTH EVERY DAY 30 tablet 6   No current facility-administered medications for this visit.     Past Medical History:  Diagnosis Date  . Atrial fibrillation (HCC) 2014   Initial diagnosis in 12/2012  . Chronic abdominal wound infection   . DJD (degenerative joint disease)    Knees  . Hyperlipidemia   . Hypertension   . Morbid obesity (HCC)     Past Surgical History:  Procedure Laterality Date  . ABDOMINAL DEBRIDEMENT  2009   Dr. Ovidio Kinavid Newman, infected mesh/abscess  . VENTRAL HERNIA REPAIR  1990s-2000   X4  . WRIST SURGERY     Right    Social History   Social History  . Marital status: Single    Spouse name: N/A  . Number of children: N/A  . Years of education: N/A   Occupational History  . Disabled    Social History Main Topics  . Smoking status: Never Smoker  . Smokeless tobacco: Never  Used  . Alcohol use No  . Drug use: No  . Sexual activity: Not on file   Other Topics Concern  . Not on file   Social History Narrative   Lives alone with family nearby     Vitals:   10/10/16 1359  BP: (!) 158/98  Pulse: 93  SpO2: 99%  Weight: (!) 397 lb (180.1 kg)  Height: 5\' 8"  (1.727 m)    PHYSICAL EXAM General: NAD HEENT: Normal. Neck: JVP difficult to assess. Lungs: Clear to auscultation bilaterally with normal respiratory effort. CV: Regular rate and irregular rhythm, normal S1/S2, no S3, 1/6 systolic murmur along left sternal border. Brawny pedal edema with b/l stasis dermatitis.  Abdomen: Obese. Neurologic: Alert and oriented x 3.  Psych: Normal affect.    ECG: Most recent ECG reviewed.      ASSESSMENT AND PLAN: 1. Chronic atrial fibrillation: Rate controlled on diltiazem and on Xarelto for anticoagulation. No changes.  2. Essential HTN: Elevated on lisinopril 40 mg. Will add Coreg 3.125 mg bid.  3. Ascending aortic aneurysm: Aim for BP control. Will add Coreg 3.125 mg bid as noted above. Will f/u with CT surgery for surveillance scans. Suboptimal surgical candidate due to morbid obesity.  4. Chronic leg swelling: Controlled on Lasix 20 mg daily.  Dispo: f/u  1 year.   Prentice DockerSuresh Koneswaran, M.D., F.A.C.C.

## 2016-10-10 NOTE — Patient Instructions (Signed)
Your physician wants you to follow-up in: 1 year You will receive a reminder letter in the mail two months in advance. If you don't receive a letter, please call our office to schedule the follow-up appointment.     START Coreg 3.125 mg twice a day     Thank you for choosing [facility] !

## 2016-10-21 ENCOUNTER — Other Ambulatory Visit: Payer: Self-pay | Admitting: Cardiovascular Disease

## 2016-10-24 ENCOUNTER — Other Ambulatory Visit: Payer: Self-pay | Admitting: Cardiovascular Disease

## 2016-10-28 ENCOUNTER — Other Ambulatory Visit: Payer: Medicaid Other

## 2016-11-23 ENCOUNTER — Ambulatory Visit: Payer: Medicaid Other | Admitting: Cardiothoracic Surgery

## 2016-12-29 ENCOUNTER — Telehealth: Payer: Self-pay | Admitting: Cardiovascular Disease

## 2016-12-29 NOTE — Telephone Encounter (Signed)
Will forward to MD.  

## 2016-12-29 NOTE — Telephone Encounter (Signed)
Pt is thinking about having 2 teeth extracted, he'd like to know what to do about his blood thinner

## 2017-01-02 NOTE — Telephone Encounter (Signed)
Pt notified of Dr Junius ArgyleKoneswaran's advise,states he is not going to have teeth pulled soon

## 2017-01-02 NOTE — Telephone Encounter (Signed)
Can hold for one day prior, resume immediately thereafter.

## 2017-01-03 ENCOUNTER — Other Ambulatory Visit: Payer: Self-pay | Admitting: Adult Health

## 2017-01-22 ENCOUNTER — Other Ambulatory Visit: Payer: Self-pay | Admitting: Cardiovascular Disease

## 2017-03-12 ENCOUNTER — Other Ambulatory Visit: Payer: Self-pay | Admitting: Cardiovascular Disease

## 2017-04-24 ENCOUNTER — Telehealth: Payer: Self-pay | Admitting: Adult Health

## 2017-04-24 NOTE — Telephone Encounter (Signed)
No

## 2017-04-24 NOTE — Telephone Encounter (Signed)
Will check with dr Purvis Sheffieldkoneswaran

## 2017-04-24 NOTE — Telephone Encounter (Signed)
Patient states that he has a boil he wants to get lanced and wants to know if he needs to stop Xarelto prior to doing that. / tg

## 2017-04-25 NOTE — Telephone Encounter (Signed)
Pt notified not needed to hold xarelto

## 2017-05-17 ENCOUNTER — Other Ambulatory Visit: Payer: Self-pay | Admitting: Cardiovascular Disease

## 2017-05-31 ENCOUNTER — Other Ambulatory Visit: Payer: Self-pay | Admitting: Adult Health

## 2017-08-27 ENCOUNTER — Other Ambulatory Visit: Payer: Self-pay | Admitting: Cardiovascular Disease

## 2017-09-04 DIAGNOSIS — B9689 Other specified bacterial agents as the cause of diseases classified elsewhere: Secondary | ICD-10-CM | POA: Diagnosis not present

## 2017-09-04 DIAGNOSIS — L02425 Furuncle of right lower limb: Secondary | ICD-10-CM | POA: Diagnosis not present

## 2017-09-26 ENCOUNTER — Other Ambulatory Visit: Payer: Self-pay | Admitting: Cardiovascular Disease

## 2017-10-10 ENCOUNTER — Ambulatory Visit: Payer: Medicaid Other | Admitting: Cardiovascular Disease

## 2017-10-10 ENCOUNTER — Encounter: Payer: Self-pay | Admitting: Cardiovascular Disease

## 2017-10-10 VITALS — BP 144/84 | HR 76 | Ht 68.0 in | Wt 398.0 lb

## 2017-10-10 DIAGNOSIS — I10 Essential (primary) hypertension: Secondary | ICD-10-CM | POA: Diagnosis not present

## 2017-10-10 DIAGNOSIS — I712 Thoracic aortic aneurysm, without rupture, unspecified: Secondary | ICD-10-CM

## 2017-10-10 DIAGNOSIS — R600 Localized edema: Secondary | ICD-10-CM | POA: Diagnosis not present

## 2017-10-10 DIAGNOSIS — I482 Chronic atrial fibrillation, unspecified: Secondary | ICD-10-CM

## 2017-10-10 NOTE — Progress Notes (Signed)
SUBJECTIVE: The patient presents for follow-up of chronic atrial fibrillation, chronic hypertension, and an ascending aortic aneurysm.  CT angiogram on 01/04/16 showed a dilated ascending thoracic aorta measuring 4.9 cm without change since prior study.  I ordered a repeat study but he became anxious and did not have it done.  ECG performed in the office today which I personally interpreted demonstrated rate controlled atrial fibrillation, 89 bpm.  He denies chest pain and palpitations.  He denies any recent hospitalizations.      Review of Systems: As per "subjective", otherwise negative.  Allergies  Allergen Reactions  . Codeine Other (See Comments)    Mood swing  . Morphine And Related Other (See Comments)    Mood swing    Current Outpatient Medications  Medication Sig Dispense Refill  . acetaminophen (TYLENOL) 500 MG tablet Take 500 mg by mouth every 6 (six) hours as needed for pain.    . carvedilol (COREG) 3.125 MG tablet TAKE 1 TABLET (3.125 MG TOTAL) BY MOUTH 2 (TWO) TIMES DAILY. 180 tablet 3  . diltiazem (CARDIZEM CD) 300 MG 24 hr capsule TAKE 1 CAPSULE BY MOUTH DAILY 30 capsule 6  . furosemide (LASIX) 20 MG tablet Take 0.5 tablets (10 mg total) by mouth daily. 90 tablet 3  . furosemide (LASIX) 20 MG tablet TAKE 1 TABLET (20 MG TOTAL) BY MOUTH DAILY. 30 tablet 6  . lisinopril (PRINIVIL,ZESTRIL) 40 MG tablet TAKE 1 TABLET (40 MG TOTAL) BY MOUTH DAILY. 90 tablet 3  . loratadine (CLARITIN) 10 MG tablet TAKE 1 TABLET BY MOUTH DAILY 30 tablet 3  . XARELTO 20 MG TABS tablet TAKE 1 TABLET BY MOUTH EVERY DAY 30 tablet 6   No current facility-administered medications for this visit.     Past Medical History:  Diagnosis Date  . Atrial fibrillation (HCC) 2014   Initial diagnosis in 12/2012  . Chronic abdominal wound infection   . DJD (degenerative joint disease)    Knees  . Hyperlipidemia   . Hypertension   . Morbid obesity (HCC)     Past Surgical History:    Procedure Laterality Date  . ABDOMINAL DEBRIDEMENT  2009   Dr. Ovidio Kinavid Newman, infected mesh/abscess  . VENTRAL HERNIA REPAIR  1990s-2000   X4  . WRIST SURGERY     Right    Social History   Socioeconomic History  . Marital status: Single    Spouse name: Not on file  . Number of children: Not on file  . Years of education: Not on file  . Highest education level: Not on file  Social Needs  . Financial resource strain: Not on file  . Food insecurity - worry: Not on file  . Food insecurity - inability: Not on file  . Transportation needs - medical: Not on file  . Transportation needs - non-medical: Not on file  Occupational History  . Occupation: Disabled  Tobacco Use  . Smoking status: Never Smoker  . Smokeless tobacco: Never Used  Substance and Sexual Activity  . Alcohol use: No    Alcohol/week: 0.0 oz  . Drug use: No  . Sexual activity: Not on file  Other Topics Concern  . Not on file  Social History Narrative   Lives alone with family nearby     Vitals:   10/10/17 1010  BP: (!) 144/84  Pulse: 76  SpO2: 93%  Weight: (!) 398 lb (180.5 kg)  Height: 5\' 8"  (1.727 m)    Wt Readings from  Last 3 Encounters:  10/10/17 (!) 398 lb (180.5 kg)  10/10/16 (!) 397 lb (180.1 kg)  01/06/16 (!) 400 lb 5.7 oz (181.6 kg)     PHYSICAL EXAM General: NAD HEENT: Normal. Neck: No JVD, no thyromegaly. Lungs: Clear to auscultation bilaterally with normal respiratory effort. CV: Regular rate and irregular rhythm, normal S1/S2, no S3, 1/6 systolic murmur along left sternal border. Brawny pedal edema with b/l stasis dermatitis. Abdomen: Morbidly obese.  Neurologic: Alert and oriented.  Psych: Normal affect. Skin: Normal. Musculoskeletal: No gross deformities.    ECG: Most recent ECG reviewed.   Labs: Lab Results  Component Value Date/Time   K 4.2 01/06/2016 05:40 AM   BUN 15 01/06/2016 05:40 AM   CREATININE 1.10 01/06/2016 05:40 AM   CREATININE 1.13 05/01/2013 12:00  PM   ALT 13 (L) 01/06/2016 05:40 AM   TSH 1.408 01/04/2016 11:43 AM   TSH 1.918 01/16/2013 12:59 PM   HGB 13.1 01/06/2016 05:40 AM     Lipids: Lab Results  Component Value Date/Time   LDLCALC  01/14/2008 05:15 AM    86        Total Cholesterol/HDL:CHD Risk Coronary Heart Disease Risk Table                     Men   Women  1/2 Average Risk   3.4   3.3   CHOL  01/14/2008 05:15 AM    115        ATP III CLASSIFICATION:  <200     mg/dL   Desirable  [id number]  mg/dL   Borderline High  >=045>=240    mg/dL   High   TRIG 42 40/98/119102/23/2009 05:15 AM   HDL 21 (L) 01/14/2008 05:15 AM       ASSESSMENT AND PLAN:  1. Chronic atrial fibrillation: Symptomatically stable.  Rate controlled on diltiazem and on Xarelto for anticoagulation. No changes.  2. Essential HTN: Elevated.  I talked to him about increasing the dose of Coreg but he would prefer to hold off for now.  3. Ascending aortic aneurysm:  He prefers not to undergo any surveillance imaging.  I talked to him about increasing carvedilol for blood pressure control but he would prefer to hold off.  4. Chronic leg swelling: Controlled on Lasix 10 mg daily.     Disposition: Follow up 1 year   Prentice DockerSuresh Aqua Denslow, M.D., F.A.C.C.

## 2017-10-10 NOTE — Patient Instructions (Signed)
Medication Instructions:  Your physician recommends that you continue on your current medications as directed. Please refer to the Current Medication list given to you today.   Labwork: NONE  Testing/Procedures: NONE  Follow-Up: Your physician wants you to follow-up in: 1 Year with Dr. Koneswaran. You will receive a reminder letter in the mail two months in advance. If you don't receive a letter, please call our office to schedule the follow-up appointment.   Any Other Special Instructions Will Be Listed Below (If Applicable).     If you need a refill on your cardiac medications before your next appointment, please call your pharmacy.  Thank you for choosing [facility]!   

## 2017-10-15 ENCOUNTER — Other Ambulatory Visit: Payer: Self-pay | Admitting: Cardiovascular Disease

## 2017-11-08 DIAGNOSIS — L732 Hidradenitis suppurativa: Secondary | ICD-10-CM | POA: Diagnosis not present

## 2017-11-08 DIAGNOSIS — T148XXA Other injury of unspecified body region, initial encounter: Secondary | ICD-10-CM | POA: Diagnosis not present

## 2017-11-12 ENCOUNTER — Other Ambulatory Visit: Payer: Self-pay | Admitting: Adult Health

## 2017-11-15 ENCOUNTER — Other Ambulatory Visit: Payer: Self-pay | Admitting: Cardiovascular Disease

## 2017-12-16 ENCOUNTER — Other Ambulatory Visit: Payer: Self-pay | Admitting: Adult Health

## 2017-12-18 ENCOUNTER — Other Ambulatory Visit: Payer: Self-pay | Admitting: Cardiovascular Disease

## 2017-12-23 ENCOUNTER — Other Ambulatory Visit: Payer: Self-pay | Admitting: Adult Health

## 2017-12-25 ENCOUNTER — Other Ambulatory Visit: Payer: Self-pay | Admitting: Cardiovascular Disease

## 2017-12-26 ENCOUNTER — Other Ambulatory Visit: Payer: Self-pay | Admitting: Adult Health

## 2017-12-26 ENCOUNTER — Other Ambulatory Visit: Payer: Self-pay | Admitting: Cardiovascular Disease

## 2018-01-06 ENCOUNTER — Ambulatory Visit: Payer: Self-pay | Admitting: Surgery

## 2018-02-08 ENCOUNTER — Telehealth: Payer: Self-pay | Admitting: Cardiovascular Disease

## 2018-02-08 NOTE — Telephone Encounter (Signed)
Do not stop 

## 2018-02-08 NOTE — Telephone Encounter (Signed)
Would like to speak w/ a nurse about being off his Xarelto for 3 days

## 2018-02-08 NOTE — Telephone Encounter (Signed)
Retirned pt call. He was advised not to stop his xarelto prior to having boil lanced. He voiced understanding. Routed Dr. Ezzard StandingNewman the phone notes.

## 2018-02-08 NOTE — Telephone Encounter (Signed)
Returned pt call. Pt stated that his doctor will lance a boil next week and told him he will need to hold his xarelto for 3 days. I know in the past it was not required. Please advise.

## 2018-03-08 DIAGNOSIS — L7682 Other postprocedural complications of skin and subcutaneous tissue: Secondary | ICD-10-CM | POA: Diagnosis not present

## 2018-03-08 DIAGNOSIS — L732 Hidradenitis suppurativa: Secondary | ICD-10-CM | POA: Diagnosis not present

## 2018-03-08 DIAGNOSIS — T8131XA Disruption of external operation (surgical) wound, not elsewhere classified, initial encounter: Secondary | ICD-10-CM | POA: Diagnosis not present

## 2018-03-23 ENCOUNTER — Other Ambulatory Visit: Payer: Self-pay | Admitting: Adult Health

## 2018-03-23 ENCOUNTER — Other Ambulatory Visit: Payer: Self-pay | Admitting: Cardiovascular Disease

## 2018-03-24 ENCOUNTER — Other Ambulatory Visit: Payer: Self-pay | Admitting: Adult Health

## 2018-03-26 ENCOUNTER — Other Ambulatory Visit: Payer: Self-pay | Admitting: Adult Health

## 2018-03-26 MED ORDER — LORATADINE 10 MG PO TABS: 10.0000 mg | ORAL_TABLET | Freq: Every day | ORAL | 0 refills | Status: DC

## 2018-03-26 NOTE — Telephone Encounter (Signed)
Per note from Dr.K Lyman Bishop NP, pt is to get claritin refill from his pcp. I refilled this once

## 2018-03-26 NOTE — Telephone Encounter (Signed)
Defer to PCP

## 2018-03-26 NOTE — Telephone Encounter (Signed)
Patient would like RX for Claritin sent [facility] [place] / tg

## 2018-04-05 ENCOUNTER — Other Ambulatory Visit: Payer: Self-pay | Admitting: Cardiovascular Disease

## 2018-04-10 ENCOUNTER — Ambulatory Visit: Payer: Self-pay | Admitting: Surgery

## 2018-04-24 ENCOUNTER — Other Ambulatory Visit: Payer: Self-pay | Admitting: Adult Health

## 2018-04-24 ENCOUNTER — Other Ambulatory Visit: Payer: Self-pay | Admitting: Cardiovascular Disease

## 2018-04-24 NOTE — Telephone Encounter (Signed)
REFILL 

## 2018-05-03 ENCOUNTER — Encounter (HOSPITAL_BASED_OUTPATIENT_CLINIC_OR_DEPARTMENT_OTHER): Payer: Self-pay | Admitting: *Deleted

## 2018-05-03 NOTE — Progress Notes (Signed)
Spoke with Elease HashimotoPatricia at [facility].  She did  confirm that Dr Ezzard StandingNewman wants local only for pt since his BMI is greater than what meets our criteria.

## 2018-05-08 ENCOUNTER — Other Ambulatory Visit: Payer: Self-pay | Admitting: Cardiovascular Disease

## 2018-05-09 ENCOUNTER — Ambulatory Visit (HOSPITAL_BASED_OUTPATIENT_CLINIC_OR_DEPARTMENT_OTHER): Payer: Medicare Other | Admitting: Anesthesiology

## 2018-05-09 NOTE — H&P (Signed)
Jeremy Rush  [facility] Patient #: [id number] DOB: 10/18/1951 Single / Language: [place] / Race: White Male  History of Present Illness   The patient is a 67 year old male who presents for wound check.  [facility]  According to the POC sheet - his PCP is Dr. Prentice DockerSuresh Koneswaran (this is a cardiologist). He comes by himself.  I last saw him in December 2018. The drainage from his abdominal wound is about the same.  He is more bothered by a draining wound in his right thigh. He has shown me a wound in his right upper inner thigh. This is a chronic hidradenitis cyst. I think it is amenable to resection. I would want him off the Xarelto for 3 days prior to excising this. I got cardiac clearance by his cardiologist, D. S. Koneswaran around 01/06/2018. Dr. Purvis SheffieldKoneswaran suggested holding the Xarelto for just one day, but I think that that is a bit short.  The excision would be under local. I discussed the potential complications including bleeding, nerve injury, recurrence of the abscesses. Certainly his anticoagulation increase his risk of hematoma and bleeding from the wound. I stressed that he showers daily after surgery. We will arrange to have this posted. I also encouraged him to have someone come with his to the surgery - though he likes to do things by himself.  Past Medical History: 1. Chronic infected abdominal wound  He had an onlay mesh placed for an anterior abdominal hernia by Dr. Francina AmesPeter Young in 1999.  He presented to the [facility] service in September 2009 with an abdominal wall abscess. So I have followed him since then for this chronic wound. We have talked about loosing weight, but to no avail. 2. Morbid obesity. In or around 400 pounds. He talks about losing weight, but has not done much about this. 3. Systolic dysfunction and atrial  fibrillation - has seen Dr. Elvera Lennox. Dietrich Patesothbart. He now sees Dr. Purvis SheffieldKoneswaran. He last saw him on 10/10/2016. 4. HTN 5. Right knee 6. Anticoagulation - for A. Fib.  on Xarelto - he sturggles with this some 7. Chronic venous stasis 8. Ascending aortic aneurysm 9. He could not participate in the C. Diff study. 10. Poor teeth - considering get some (if not all) teeth pulled.  SOCIAL HISTORY: Unmarried His mother died March 2015. He lives with his sister. He does not have a car, but borrows his sister's car. So travel is always an issue.   Allergies Maurilio Lovely(Angela Holmes; 03/08/2018 3:56 PM) Codeine Sulfate *ANALGESICS - OPIOID*  Morphine Sulfate ER *ANALGESICS - OPIOID*  Allergies Reconciled   Medication History Maurilio Lovely(Angela Holmes; 03/08/2018 3:56 PM) Gauze Dressing (4"X4" Pad, 2 (two) Pad two times daily, Taken starting 10/22/2015) Active. (Apply to wound twice a day) Tylenol Extra Strength (500MG  Tablet, Oral) Active. Cardizem CD (240MG  Capsule ER 24HR, Oral) Active. Lisinopril-Hydrochlorothiazide (10-12.5MG  Tablet, Oral) Active. Claritin (10MG  Tablet, Oral) Active. Xarelto (20MG  Tablet, Oral) Active. Carvedilol (3.125MG  Tablet, Oral) Active. DilTIAZem HCl ER Coated Beads (300MG  Capsule ER 24HR, Oral) Active. Furosemide (20MG  Tablet, Oral) Active. Lisinopril (40MG  Tablet, Oral) Active. Medications Reconciled  Vitals Maurilio Lovely(Angela Holmes; 03/08/2018 3:57 PM) 03/08/2018 3:56 PM Weight: 391 lb Height: 68in Body Surface Area: 2.72 m Body Mass Index: 59.45 kg/m  Temp.: 97.12F(Oral)  Pulse: 91 (Regular)  BP: 152/90 (Sitting, Left Arm, Standard)  Physical Exam  General: Very obese WM who is doing okay.  Abdomen: Obese. Draining sinus  wound.  about the same. He has some granulation tissue around the draining wound. I got a q-tip into the wound. I did not get much out.  Extremities: He has chronic venous stasis disease of both lower  extremities.   He has a chonic wound/cyst in the upper medial right thigh. The residual mass is 3 x 9 cm.   We have looked at this several times.  He wants to go ahead an post this case.  Assessment & Plan  1.  HIDRADENITIS (L73.2)  Impression: 3 x 9 cm area in right upper medial thigh  Plan:   1) Hold xarelto for 3 days prior to surgery   2) Plan excision of right thigh mass.  2.  WOUND DRAINAGE (T14.8XXA) - lower midline abdominal wound  Story: Had an onlay mesh placed by Dr. Francina Ames in 1999.  Plan:  1) I will see back in 6 months.  3.  MORBID OBESITY (E66.01)  Impression: Continue to work on loosing weight.  4. Systolic dysfunction and atrial fibrillation - has seen Dr. Elvera Lennox. Dietrich Pates. He now sees Dr. Purvis Sheffield. He last saw him on 10/10/2016. 5. HTN 6. Right knee 7. Anticoagulation - for A. Fib.  on Xarelto - he sturggles with this some 8. Chronic venous stasis 9. Ascending aortic aneurysm 10. Poor teeth - considering get some (if not all) teeth pulled.   Ovidio Kin, MD, [facility] Pager: [phone] Office phone:  [phone]

## 2018-05-10 ENCOUNTER — Encounter (HOSPITAL_BASED_OUTPATIENT_CLINIC_OR_DEPARTMENT_OTHER)
Admission: RE | Disposition: A | Discharge status: Home / Self Care | Payer: Self-pay | Source: Ambulatory Visit | Attending: Surgery

## 2018-05-10 ENCOUNTER — Encounter (HOSPITAL_BASED_OUTPATIENT_CLINIC_OR_DEPARTMENT_OTHER): Payer: Self-pay | Admitting: *Deleted

## 2018-05-10 ENCOUNTER — Ambulatory Visit (HOSPITAL_BASED_OUTPATIENT_CLINIC_OR_DEPARTMENT_OTHER)
Admission: RE | Admit: 2018-05-10 | Discharge: 2018-05-10 | Disposition: A | Discharge status: Home / Self Care | Payer: Medicare Other | Source: Ambulatory Visit | Attending: Surgery | Admitting: Surgery

## 2018-05-10 ENCOUNTER — Other Ambulatory Visit: Payer: Self-pay

## 2018-05-10 DIAGNOSIS — I4891 Unspecified atrial fibrillation: Secondary | ICD-10-CM | POA: Diagnosis not present

## 2018-05-10 DIAGNOSIS — I712 Thoracic aortic aneurysm, without rupture: Secondary | ICD-10-CM | POA: Insufficient documentation

## 2018-05-10 DIAGNOSIS — Z7901 Long term (current) use of anticoagulants: Secondary | ICD-10-CM | POA: Insufficient documentation

## 2018-05-10 DIAGNOSIS — I878 Other specified disorders of veins: Secondary | ICD-10-CM | POA: Insufficient documentation

## 2018-05-10 DIAGNOSIS — I10 Essential (primary) hypertension: Secondary | ICD-10-CM | POA: Diagnosis not present

## 2018-05-10 DIAGNOSIS — L732 Hidradenitis suppurativa: Secondary | ICD-10-CM | POA: Diagnosis not present

## 2018-05-10 DIAGNOSIS — Z6841 Body Mass Index (BMI) 40.0 and over, adult: Secondary | ICD-10-CM | POA: Diagnosis not present

## 2018-05-10 DIAGNOSIS — Z539 Procedure and treatment not carried out, unspecified reason: Secondary | ICD-10-CM | POA: Insufficient documentation

## 2018-05-10 SURGERY — EXCISION MASS
Anesthesia: Monitor Anesthesia Care | Laterality: Right

## 2018-05-10 MED ORDER — PROPOFOL 10 MG/ML IV BOLUS
INTRAVENOUS | Status: AC
  Filled 2018-05-10: qty 20

## 2018-05-10 MED ORDER — FENTANYL CITRATE (PF) 100 MCG/2ML IJ SOLN: 50.0000 ug | INTRAMUSCULAR | Status: DC | PRN

## 2018-05-10 MED ORDER — SUCCINYLCHOLINE CHLORIDE 200 MG/10ML IV SOSY
PREFILLED_SYRINGE | INTRAVENOUS | Status: AC
  Filled 2018-05-10: qty 10

## 2018-05-10 MED ORDER — SODIUM CHLORIDE 0.9 % IJ SOLN
INTRAMUSCULAR | Status: AC
  Filled 2018-05-10: qty 10

## 2018-05-10 MED ORDER — PHENYLEPHRINE 40 MCG/ML (10ML) SYRINGE FOR IV PUSH (FOR BLOOD PRESSURE SUPPORT)
PREFILLED_SYRINGE | INTRAVENOUS | Status: AC
  Filled 2018-05-10: qty 10

## 2018-05-10 MED ORDER — CHLORHEXIDINE GLUCONATE CLOTH 2 % EX PADS: 6.0000 | MEDICATED_PAD | Freq: Once | CUTANEOUS | Status: DC

## 2018-05-10 MED ORDER — SCOPOLAMINE 1 MG/3DAYS TD PT72: 1.0000 | MEDICATED_PATCH | Freq: Once | TRANSDERMAL | Status: DC | PRN

## 2018-05-10 MED ORDER — EPHEDRINE SULFATE 50 MG/ML IJ SOLN
INTRAMUSCULAR | Status: AC
  Filled 2018-05-10: qty 1

## 2018-05-10 MED ORDER — MIDAZOLAM HCL 2 MG/2ML IJ SOLN: 1.0000 mg | INTRAMUSCULAR | Status: DC | PRN

## 2018-05-10 MED ORDER — LACTATED RINGERS IV SOLN: INTRAVENOUS | Status: DC

## 2018-05-10 MED ORDER — LIDOCAINE HCL (CARDIAC) PF 100 MG/5ML IV SOSY
PREFILLED_SYRINGE | INTRAVENOUS | Status: AC
  Filled 2018-05-10: qty 5

## 2018-05-10 MED ORDER — PHENYLEPHRINE HCL 10 MG/ML IJ SOLN
INTRAMUSCULAR | Status: AC
  Filled 2018-05-10: qty 1

## 2018-05-10 NOTE — Progress Notes (Signed)
Per pt this was suppose to only be a local case. After reviewing his history and explaining the procedure he was having thoughts and wanted to talk with Dr. Ezzard Jeremy Rush before starting his IV and drawing blood. After speaking with MD the pt changed his mind and is not going through with planned procedure. He plans to reschedule at later time. Personal belongings returned and his brother in law is in waiting room to transport back home.

## 2018-08-03 ENCOUNTER — Other Ambulatory Visit: Payer: Self-pay | Admitting: Cardiovascular Disease

## 2018-08-23 ENCOUNTER — Other Ambulatory Visit: Payer: Self-pay | Admitting: Cardiovascular Disease

## 2018-09-21 ENCOUNTER — Other Ambulatory Visit: Payer: Self-pay | Admitting: Cardiovascular Disease

## 2018-10-09 ENCOUNTER — Other Ambulatory Visit: Payer: Self-pay | Admitting: Cardiovascular Disease

## 2018-10-25 ENCOUNTER — Other Ambulatory Visit (HOSPITAL_COMMUNITY)
Admission: RE | Admit: 2018-10-25 | Discharge: 2018-10-25 | Disposition: A | Discharge status: Home / Self Care | Payer: Medicare Other | Source: Ambulatory Visit | Attending: Cardiovascular Disease | Admitting: Cardiovascular Disease

## 2018-10-25 ENCOUNTER — Ambulatory Visit (INDEPENDENT_AMBULATORY_CARE_PROVIDER_SITE_OTHER): Payer: Medicare Other | Admitting: Cardiovascular Disease

## 2018-10-25 ENCOUNTER — Encounter: Payer: Self-pay | Admitting: Cardiovascular Disease

## 2018-10-25 VITALS — BP 156/110 | HR 77 | Ht 68.0 in | Wt 385.6 lb

## 2018-10-25 DIAGNOSIS — I4821 Permanent atrial fibrillation: Secondary | ICD-10-CM | POA: Diagnosis not present

## 2018-10-25 DIAGNOSIS — R600 Localized edema: Secondary | ICD-10-CM | POA: Diagnosis not present

## 2018-10-25 DIAGNOSIS — I482 Chronic atrial fibrillation, unspecified: Secondary | ICD-10-CM | POA: Insufficient documentation

## 2018-10-25 DIAGNOSIS — I712 Thoracic aortic aneurysm, without rupture, unspecified: Secondary | ICD-10-CM

## 2018-10-25 DIAGNOSIS — I10 Essential (primary) hypertension: Secondary | ICD-10-CM | POA: Diagnosis not present

## 2018-10-25 LAB — BASIC METABOLIC PANEL
Anion gap: 6 (ref 5–15)
BUN: 15 mg/dL (ref 8–23)
CHLORIDE: 104 mmol/L (ref 98–111)
CO2: 27 mmol/L (ref 22–32)
Calcium: 9 mg/dL (ref 8.9–10.3)
Creatinine, Ser: 0.87 mg/dL (ref 0.61–1.24)
GFR calc Af Amer: >60 mL/min (ref >60)
GFR calc non Af Amer: >60 mL/min (ref >60)
Glucose, Bld: 89 mg/dL (ref 70–99)
Potassium: 4.1 mmol/L (ref 3.5–5.1)
SODIUM: 137 mmol/L (ref 135–145)

## 2018-10-25 LAB — CBC
HEMATOCRIT: 45.7 % (ref 39.0–52.0)
HEMOGLOBIN: 13.9 g/dL (ref 13.0–17.0)
MCH: 26.2 pg (ref 26.0–34.0)
MCHC: 30.4 g/dL (ref 30.0–36.0)
MCV: 86.2 fL (ref 80.0–100.0)
Platelets: 199 10*3/uL (ref 150–400)
RBC: 5.3 MIL/uL (ref 4.22–5.81)
RDW: 14.7 % (ref 11.5–15.5)
WBC: 6.3 10*3/uL (ref 4.0–10.5)
nRBC: 0 % (ref 0.0–0.2)

## 2018-10-25 MED ORDER — CARVEDILOL 6.25 MG PO TABS: 6.2500 mg | ORAL_TABLET | Freq: Two times a day (BID) | ORAL | 3 refills | Status: DC

## 2018-10-25 NOTE — Progress Notes (Signed)
SUBJECTIVE: The patient presents for follow-up of permanent atrial fibrillation, hypertension, and ascending aortic aneurysm.  CT angiogram on 01/04/16 showed a dilated ascending thoracic aorta measuring 4.9 cm without change since prior study.  I ordered a repeat study but he became anxious and did not have it done.  ECG performed in the office today which I ordered and personally interpreted demonstrates rate controlled atrial fibrillation with incomplete right bundle branch block.  He denies chest pain.  He does not have a PCP but says he is in the process of getting 1.  He previously saw a dentist who referred him to [facility] to get his teeth pulled but he is scared of going.  He thinks his elevated blood pressure is due to a dental infection.  When I spoke to him again about increasing carvedilol for better blood pressure control, he had several questions.  We again talked about obtaining either a CT or MRI for his thoracic aortic aneurysm but he said he is too scared to have this done.   Review of Systems: As per "subjective", otherwise negative.  Allergies  Allergen Reactions  . Codeine Other (See Comments)    Mood swing  . Morphine And Related Other (See Comments)    Mood swing    Current Outpatient Medications  Medication Sig Dispense Refill  . acetaminophen (TYLENOL) 500 MG tablet Take 500 mg by mouth every 6 (six) hours as needed for pain.    . carvedilol (COREG) 3.125 MG tablet TAKE 1 TABLET BY MOUTH TWICE A DAY 180 tablet 3  . diltiazem (CARDIZEM CD) 300 MG 24 hr capsule TAKE 1 CAPSULE BY MOUTH DAILY 90 capsule 2  . furosemide (LASIX) 20 MG tablet TAKE 1 TABLET (20 MG TOTAL) BY MOUTH DAILY. 90 tablet 3  . lisinopril (PRINIVIL,ZESTRIL) 40 MG tablet TAKE 1 TABLET (40 MG TOTAL) BY MOUTH DAILY. 90 tablet 3  . loratadine (CLARITIN) 10 MG tablet TAKE 1 TABLET BY MOUTH EVERY DAY. 30 tablet 5  . XARELTO 20 MG TABS tablet TAKE 1 TABLET BY MOUTH EVERY DAY 30 tablet 6   No  current facility-administered medications for this visit.     Past Medical History:  Diagnosis Date  . Atrial fibrillation (HCC) 2014   Initial diagnosis in 12/2012  . Chronic abdominal wound infection   . DJD (degenerative joint disease)    Knees  . Hyperlipidemia   . Hypertension   . Morbid obesity (HCC)     Past Surgical History:  Procedure Laterality Date  . ABDOMINAL DEBRIDEMENT  2009   Dr. Ovidio Kin, infected mesh/abscess  . VENTRAL HERNIA REPAIR  1990s-2000   X4  . WRIST SURGERY     Right    Social History   Socioeconomic History  . Marital status: Single    Spouse name: Not on file  . Number of children: Not on file  . Years of education: Not on file  . Highest education level: Not on file  Occupational History  . Occupation: Disabled  Social Needs  . Financial resource strain: Not on file  . Food insecurity:    Worry: Not on file    Inability: Not on file  . Transportation needs:    Medical: Not on file    Non-medical: Not on file  Tobacco Use  . Smoking status: Never Smoker  . Smokeless tobacco: Never Used  Substance and Sexual Activity  . Alcohol use: No    Alcohol/week: 0.0 standard drinks  .  Drug use: No  . Sexual activity: Not on file  Lifestyle  . Physical activity:    Days per week: Not on file    Minutes per session: Not on file  . Stress: Not on file  Relationships  . Social connections:    Talks on phone: Not on file    Gets together: Not on file    Attends religious service: Not on file    Active member of club or organization: Not on file    Attends meetings of clubs or organizations: Not on file    Relationship status: Not on file  . Intimate partner violence:    Fear of current or ex partner: Not on file    Emotionally abused: Not on file    Physically abused: Not on file    Forced sexual activity: Not on file  Other Topics Concern  . Not on file  Social History Narrative   Lives alone with family nearby     Vitals:     10/25/18 1401  BP: (!) 156/110  Pulse: 77  SpO2: 95%  Weight: (!) 385 lb 9.6 oz (174.9 kg)  Height: 5\' 8"  (1.727 m)    Wt Readings from Last 3 Encounters:  10/25/18 (!) 385 lb 9.6 oz (174.9 kg)  05/10/18 (!) 388 lb 9.6 oz (176.3 kg)  10/10/17 (!) 398 lb (180.5 kg)     PHYSICAL EXAM General: NAD HEENT: Normal. Neck: No JVD, no thyromegaly. Lungs: Clear to auscultation bilaterally with normal respiratory effort. CV: Regular rate and irregular rhythm, normal S1/S2, no S3, 1/6 systolic murmur along left sternal border. Brawny pedal edema with b/l stasis dermatitis. No carotid bruit. Abdomen: Morbidly obese.  Neurologic: Alert and oriented.  Psych: Normal affect. Skin: Normal. Musculoskeletal: No gross deformities.    ECG: Reviewed above under Subjective   Labs: Lab Results  Component Value Date/Time   K 4.2 01/06/2016 05:40 AM   BUN 15 01/06/2016 05:40 AM   CREATININE 1.10 01/06/2016 05:40 AM   CREATININE 1.13 05/01/2013 12:00 PM   ALT 13 (L) 01/06/2016 05:40 AM   TSH 1.408 01/04/2016 11:43 AM   TSH 1.918 01/16/2013 12:59 PM   HGB 13.1 01/06/2016 05:40 AM     Lipids: Lab Results  Component Value Date/Time   LDLCALC  01/14/2008 05:15 AM    86        Total Cholesterol/HDL:CHD Risk Coronary Heart Disease Risk Table                     Men   Women  1/2 Average Risk   3.4   3.3   CHOL  01/14/2008 05:15 AM    115        ATP III CLASSIFICATION:  <200     mg/dL   Desirable  161-096  mg/dL   Borderline High  >=045    mg/dL   High   TRIG 42 40/98/1191 05:15 AM   HDL 21 (L) 01/14/2008 05:15 AM       ASSESSMENT AND PLAN:  1. Permanent atrial fibrillation: Symptomatically stable.  Rate controlled on diltiazem and on Xarelto for anticoagulation. No changes.  I will check a CBC.  2. Essential HTN: Elevated.  I will increase carvedilol to 6.25 mg twice daily.  3. Ascending aortic aneurysm: He prefers not to undergo any surveillance imaging.  I again spoke  with him about the importance of obtaining either a CT or MRI/MRA but he is too scared.  4. Chronic  leg swelling: Controlled on Lasix 20 mg daily.  I will check a basic metabolic panel.    Disposition: Follow up 1 year   Prentice DockerSuresh Koneswaran, M.D., F.A.C.C.

## 2018-10-25 NOTE — Patient Instructions (Addendum)
Your physician wants you to follow-up in:1 year with Dr.Koneswaran You will receive a reminder letter in the mail two months in advance. If you don't receive a letter, please call our office to schedule the follow-up appointment.    INCREASE Coreg to 6.25 mg twice a day    If you need a refill on your cardiac medications before your next appointment, please call your pharmacy.     Get lab work: [employer]    Call me, Marcha SoldersCathey RN, [phone] if you want to get CT scan      Thank you for choosing [facility] !

## 2018-11-10 ENCOUNTER — Other Ambulatory Visit: Payer: Self-pay | Admitting: Cardiovascular Disease

## 2018-11-23 DIAGNOSIS — L7682 Other postprocedural complications of skin and subcutaneous tissue: Secondary | ICD-10-CM | POA: Diagnosis not present

## 2018-11-23 DIAGNOSIS — L732 Hidradenitis suppurativa: Secondary | ICD-10-CM | POA: Diagnosis not present

## 2019-04-29 ENCOUNTER — Telehealth: Payer: Self-pay | Admitting: Cardiovascular Disease

## 2019-04-29 NOTE — Telephone Encounter (Signed)
Has some questions about taking a new medication.

## 2019-04-29 NOTE — Telephone Encounter (Signed)
Pt made aware

## 2019-04-29 NOTE — Telephone Encounter (Signed)
Would not use routinely but sparingly would be ok.

## 2019-04-29 NOTE — Telephone Encounter (Signed)
Can he use OTC Voltaren gel while on xarelto ?

## 2019-05-14 ENCOUNTER — Other Ambulatory Visit: Payer: Self-pay

## 2019-05-14 MED ORDER — DILTIAZEM HCL ER COATED BEADS 300 MG PO CP24: 300.0000 mg | ORAL_CAPSULE | Freq: Every day | ORAL | 2 refills | Status: DC

## 2019-05-14 NOTE — Telephone Encounter (Signed)
[facility]

## 2019-10-07 ENCOUNTER — Other Ambulatory Visit: Payer: Self-pay | Admitting: Cardiovascular Disease

## 2019-11-12 ENCOUNTER — Other Ambulatory Visit: Payer: Self-pay

## 2019-11-12 MED ORDER — CARVEDILOL 6.25 MG PO TABS: 6.2500 mg | ORAL_TABLET | Freq: Two times a day (BID) | ORAL | 3 refills | Status: DC

## 2019-11-29 ENCOUNTER — Other Ambulatory Visit: Payer: Self-pay | Admitting: *Deleted

## 2019-11-29 ENCOUNTER — Other Ambulatory Visit: Payer: Self-pay | Admitting: Cardiovascular Disease

## 2019-11-29 MED ORDER — RIVAROXABAN 20 MG PO TABS: 20.0000 mg | ORAL_TABLET | Freq: Every day | ORAL | 3 refills | Status: DC

## 2019-11-29 MED ORDER — FUROSEMIDE 20 MG PO TABS: 20.0000 mg | ORAL_TABLET | Freq: Every day | ORAL | 0 refills | Status: AC

## 2019-11-29 MED ORDER — FUROSEMIDE 20 MG PO TABS: 20.0000 mg | ORAL_TABLET | Freq: Every day | ORAL | 3 refills | Status: DC

## 2019-11-29 MED ORDER — RIVAROXABAN 20 MG PO TABS: 20.0000 mg | ORAL_TABLET | Freq: Every day | ORAL | 0 refills | Status: DC

## 2019-11-29 NOTE — Telephone Encounter (Signed)
°*  STAT* If patient is at the pharmacy, call can be transferred to refill team.   1. Which medications need to be refilled? (please list name of each medication and dose if known)  furosemide (LASIX) 20 MG tablet XARELTO 20 MG TABS tablet  2. Which pharmacy/location (including street and city if local pharmacy) is medication to be sent to?[id number] [place] [place] [facility] [address]  3. Do they need a 30 day or 90 day supply? 90 day  Pharmacy states patient needs medication today

## 2019-11-29 NOTE — Telephone Encounter (Signed)
Refilled per request.

## 2020-02-13 ENCOUNTER — Other Ambulatory Visit: Payer: Self-pay | Admitting: Cardiovascular Disease

## 2020-02-17 ENCOUNTER — Other Ambulatory Visit: Payer: Self-pay | Admitting: Cardiovascular Disease

## 2020-02-17 MED ORDER — DILTIAZEM HCL ER COATED BEADS 300 MG PO CP24: ORAL_CAPSULE | ORAL | 0 refills | Status: DC

## 2020-02-17 NOTE — Telephone Encounter (Signed)
     1. Which medications need to be refilled? (please list name of each medication and dose if known)  diltiazem (CARDIZEM CD) 300    2. Which pharmacy/location (including street and city if local pharmacy) [facility]   3. Do they need a 30 day or 90 day supply?    Patient has an upcoming appointment on 03/04/2020. Will need more medication before appointment.

## 2020-02-17 NOTE — Telephone Encounter (Signed)
Sent in 30 days supply. Will refill for 90 days after upcoming appt.

## 2020-03-04 ENCOUNTER — Ambulatory Visit: Payer: Medicaid Other | Admitting: Cardiovascular Disease

## 2020-03-05 ENCOUNTER — Encounter: Payer: Self-pay | Admitting: Cardiovascular Disease

## 2020-03-05 ENCOUNTER — Telehealth (INDEPENDENT_AMBULATORY_CARE_PROVIDER_SITE_OTHER): Payer: Medicare Other | Admitting: Cardiovascular Disease

## 2020-03-05 VITALS — Ht 68.0 in | Wt 345.0 lb

## 2020-03-05 DIAGNOSIS — I712 Thoracic aortic aneurysm, without rupture, unspecified: Secondary | ICD-10-CM

## 2020-03-05 DIAGNOSIS — I4821 Permanent atrial fibrillation: Secondary | ICD-10-CM | POA: Diagnosis not present

## 2020-03-05 DIAGNOSIS — R600 Localized edema: Secondary | ICD-10-CM

## 2020-03-05 DIAGNOSIS — Z6841 Body Mass Index (BMI) 40.0 and over, adult: Secondary | ICD-10-CM | POA: Diagnosis not present

## 2020-03-05 DIAGNOSIS — I10 Essential (primary) hypertension: Secondary | ICD-10-CM

## 2020-03-05 NOTE — Addendum Note (Signed)
Addended by: Marlyn Corporal A on: 03/05/2020 04:39 PM   Modules accepted: Orders

## 2020-03-05 NOTE — Patient Instructions (Signed)
Medication Instructions:  Your physician recommends that you continue on your current medications as directed. Please refer to the Current Medication list given to you today.  *If you need a refill on your cardiac medications before your next appointment, please call your pharmacy*   Lab Work: Your physician recommends that you continue on your current medications as directed. Please refer to the Current Medication list given to you today.  If you have labs (blood work) drawn today and your tests are completely normal, you will receive your results only by: Marland Kitchen MyChart Message (if you have MyChart) OR . A paper copy in the mail If you have any lab test that is abnormal or we need to change your treatment, we will call you to review the results.   Testing/Procedures: Your physician has requested that you have an echocardiogram. Echocardiography is a painless test that uses sound waves to create images of your heart. It provides your doctor with information about the size and shape of your heart and how well your heart's chambers and valves are working. This procedure takes approximately one hour. There are no restrictions for this procedure.     Follow-Up: At [facility], you and your health needs are our priority.  As part of our continuing mission to provide you with exceptional heart care, we have created designated Provider Care Teams.  These Care Teams include your primary Cardiologist (physician) and Advanced Practice Providers (APPs -  Physician Assistants and Nurse Practitioners) who all work together to provide you with the care you need, when you need it.  We recommend signing up for the patient portal called "MyChart".  Sign up information is provided on this After Visit Summary.  MyChart is used to connect with patients for Virtual Visits (Telemedicine).  Patients are able to view lab/test results, encounter notes, upcoming appointments, etc.  Non-urgent messages can be sent to  your provider as well.   To learn more about what you can do with MyChart, go to [email or web address].    Your next appointment:   12 month(s)  The format for your next appointment:   In Person  Provider:   Prentice Docker, MD   Other Instructions None

## 2020-03-05 NOTE — Progress Notes (Signed)
Virtual Visit via Telephone Note   This visit type was conducted due to national recommendations for restrictions regarding the COVID-19 Pandemic (e.g. social distancing) in an effort to limit this patient's exposure and mitigate transmission in our community.  Due to his co-morbid illnesses, this patient is at least at moderate risk for complications without adequate follow up.  This format is felt to be most appropriate for this patient at this time.  The patient did not have access to video technology/had technical difficulties with video requiring transitioning to audio format only (telephone).  All issues noted in this document were discussed and addressed.  No physical exam could be performed with this format.  Please refer to the patient's chart for his  consent to telehealth for [facility].   The patient was identified using 2 identifiers.  Date:  03/05/2020   ID:  Jeremy Rush, DOB 1951/09/18, MRN [id number]  Patient Location: Home Provider Location: Office  PCP:  Patient, No Pcp Per  Cardiologist:  Prentice Docker, MD  Electrophysiologist:  None   Evaluation Performed:  Follow-Up Visit  Chief Complaint: Permanent atrial fibrillation  History of Present Illness:    Jeremy Rush is a 69 y.o. male with permanent atrial fibrillation, obesity, hypertension, and ascending aortic aneurysm.  CT angiogram on 01/04/16 showed a dilated ascending thoracic aorta measuring 4.9 cm without change since prior study. I ordered a repeat study but he became anxious and did not have it done.  He denies chest pain and palpitations.  Chronic exertional dyspnea and chronic bilateral leg edema is stable.  He still has not gone to see a PCP.  He is still vehement about not undergoing a CT scan to evaluate his aortic aneurysm.    Past Medical History:  Diagnosis Date  . Atrial fibrillation (HCC) 2014   Initial diagnosis in 12/2012  . Chronic abdominal wound infection   . DJD  (degenerative joint disease)    Knees  . Hyperlipidemia   . Hypertension   . Morbid obesity (HCC)    Past Surgical History:  Procedure Laterality Date  . ABDOMINAL DEBRIDEMENT  2009   Dr. Ovidio Kin, infected mesh/abscess  . VENTRAL HERNIA REPAIR  1990s-2000   X4  . WRIST SURGERY     Right     Current Meds  Medication Sig  . acetaminophen (TYLENOL) 500 MG tablet Take 500 mg by mouth every 6 (six) hours as needed for pain.  . carvedilol (COREG) 6.25 MG tablet Take 1 tablet (6.25 mg total) by mouth 2 (two) times daily.  Marland Kitchen diltiazem (CARDIZEM CD) 300 MG 24 hr capsule TAKE 1 CAPSULE BY MOUTH EVERY DAY  . furosemide (LASIX) 20 MG tablet Take 1 tablet (20 mg total) by mouth daily.  Marland Kitchen lisinopril (ZESTRIL) 40 MG tablet TAKE 1 TABLET (40 MG TOTAL) BY MOUTH DAILY.  Marland Kitchen loratadine (CLARITIN) 10 MG tablet TAKE 1 TABLET BY MOUTH EVERY DAY.  . rivaroxaban (XARELTO) 20 MG TABS tablet Take 1 tablet (20 mg total) by mouth daily.     Allergies:   Codeine and Morphine and related   Social History   Tobacco Use  . Smoking status: Never Smoker  . Smokeless tobacco: Never Used  Substance Use Topics  . Alcohol use: No    Alcohol/week: 0.0 standard drinks  . Drug use: No     Family Hx: The patient's Family history is unknown by patient.  ROS:   Please see the history of present illness.  All other systems reviewed and are negative.   Prior CV studies:   The following studies were reviewed today:  NA  Labs/Other Tests and Data Reviewed:    EKG:  No ECG reviewed.  Recent Labs: No results found for requested labs within last 8760 hours.   Recent Lipid Panel Lab Results  Component Value Date/Time   CHOL  01/14/2008 05:15 AM    115        ATP III CLASSIFICATION:  <200     mg/dL   Desirable  200-239  mg/dL   Borderline High  >=240    mg/dL   High   TRIG 42 01/14/2008 05:15 AM   HDL 21 (L) 01/14/2008 05:15 AM   CHOLHDL 5.5 01/14/2008 05:15 AM   LDLCALC  01/14/2008 05:15  AM    86        Total Cholesterol/HDL:CHD Risk Coronary Heart Disease Risk Table                     Men   Women  1/2 Average Risk   3.4   3.3    Wt Readings from Last 3 Encounters:  03/05/20 (!) 345 lb (156.5 kg)  10/25/18 (!) 385 lb 9.6 oz (174.9 kg)  05/10/18 (!) 388 lb 9.6 oz (176.3 kg)     Objective:    Vital Signs:  Ht 5\' 8"  (1.727 m)   Wt (!) 345 lb (156.5 kg)   BMI 52.46 kg/m    VITAL SIGNS:  reviewed  ASSESSMENT & PLAN:    1. Permanent atrial fibrillation:Symptomatically stable.Rate controlled on Cardizem CD 300 mg daily and carvedilol 6.25 mg twice daily.  Continue Xarelto for anticoagulation.  2. Essential HTN: No changes to therapy.  3. Ascending aortic aneurysm:He prefers not to undergo any surveillance imaging.  I again spoke with him about the importance of obtaining either a CT or MRI/MRA but he is too scared.  I will obtain an echocardiogram.  4. Chronic leg swelling: Controlled on Lasix20 mg daily.     COVID-19 Education: The signs and symptoms of COVID-19 were discussed with the patient and how to seek care for testing (follow up with PCP or arrange E-visit).  The importance of social distancing was discussed today.  Time:   Today, I have spent 15 minutes with the patient with telehealth technology discussing the above problems.     Medication Adjustments/Labs and Tests Ordered: Current medicines are reviewed at length with the patient today.  Concerns regarding medicines are outlined above.   Tests Ordered: No orders of the defined types were placed in this encounter.   Medication Changes: No orders of the defined types were placed in this encounter.   Follow Up:  In Person in 1 year(s)  Signed, Kate Sable, MD  03/05/2020 4:20 PM    [facility]

## 2020-03-15 ENCOUNTER — Other Ambulatory Visit: Payer: Self-pay | Admitting: Cardiovascular Disease

## 2020-03-27 ENCOUNTER — Other Ambulatory Visit (HOSPITAL_COMMUNITY): Payer: Medicaid Other

## 2020-05-08 ENCOUNTER — Other Ambulatory Visit (HOSPITAL_COMMUNITY): Payer: Medicaid Other

## 2020-09-10 ENCOUNTER — Other Ambulatory Visit: Payer: Self-pay

## 2020-09-10 MED ORDER — DILTIAZEM HCL ER COATED BEADS 300 MG PO CP24: 300.0000 mg | ORAL_CAPSULE | Freq: Every day | ORAL | 3 refills | Status: AC

## 2020-09-10 NOTE — Telephone Encounter (Signed)
Refilled diltiazem 300 mg qd #30 RF:2

## 2020-09-24 ENCOUNTER — Other Ambulatory Visit: Payer: Self-pay

## 2020-09-24 MED ORDER — LISINOPRIL 40 MG PO TABS: 40.0000 mg | ORAL_TABLET | Freq: Every day | ORAL | 0 refills | Status: AC

## 2020-09-24 NOTE — Telephone Encounter (Signed)
Refilled lisinopril

## 2020-11-03 ENCOUNTER — Other Ambulatory Visit: Payer: Self-pay

## 2020-11-03 MED ORDER — RIVAROXABAN 20 MG PO TABS: 20.0000 mg | ORAL_TABLET | Freq: Every day | ORAL | 0 refills | Status: DC

## 2020-11-09 ENCOUNTER — Telehealth: Payer: Self-pay | Admitting: Family Medicine

## 2020-11-09 MED ORDER — CARVEDILOL 6.25 MG PO TABS: 6.2500 mg | ORAL_TABLET | Freq: Two times a day (BID) | ORAL | 3 refills | Status: AC

## 2020-11-09 NOTE — Telephone Encounter (Signed)
New message       *STAT* If patient is at the pharmacy, call can be transferred to refill team.   1. Which medications need to be refilled? (please list name of each medication and dose if known) carvedilol (COREG) 6.25 MG tablet  2. Which pharmacy/location (including street and city if local pharmacy) is medication to be sent to? [facility] [place]   3. Do they need a 30 day or 90 day supply?30

## 2020-11-27 ENCOUNTER — Ambulatory Visit: Payer: Medicaid Other | Admitting: Family Medicine

## 2020-12-01 ENCOUNTER — Telehealth: Payer: Self-pay

## 2020-12-01 ENCOUNTER — Ambulatory Visit: Payer: Medicaid Other | Admitting: Physician Assistant

## 2020-12-01 NOTE — Telephone Encounter (Signed)
Not a pt of [facility]

## 2020-12-03 ENCOUNTER — Other Ambulatory Visit: Payer: Self-pay | Admitting: Student

## 2020-12-03 DIAGNOSIS — Z5181 Encounter for therapeutic drug level monitoring: Secondary | ICD-10-CM

## 2020-12-03 DIAGNOSIS — I4821 Permanent atrial fibrillation: Secondary | ICD-10-CM

## 2020-12-03 NOTE — Telephone Encounter (Signed)
Pt needs xarelto refill.  Has not had recent lab work.  Order for CBC and BMP mailed to pt.  He was called and notified.  He verbalized understanding.  1 refill given.

## 2020-12-10 ENCOUNTER — Other Ambulatory Visit: Payer: Self-pay | Admitting: Student

## 2020-12-22 DEATH — deceased

## 2021-06-11 ENCOUNTER — Telehealth: Payer: Self-pay

## 2021-06-11 NOTE — Telephone Encounter (Signed)
Done
